# Patient Record
Sex: Female | Born: 1955
Health system: Southern US, Community
[De-identification: ages and names within clinical notes are randomized; demographics above are authoritative.]

## PROBLEM LIST (undated history)

## (undated) DIAGNOSIS — R413 Other amnesia: Secondary | ICD-10-CM

## (undated) DIAGNOSIS — G4700 Insomnia, unspecified: Secondary | ICD-10-CM

## (undated) DIAGNOSIS — G459 Transient cerebral ischemic attack, unspecified: Secondary | ICD-10-CM

## (undated) DIAGNOSIS — F319 Bipolar disorder, unspecified: Secondary | ICD-10-CM

## (undated) DIAGNOSIS — F419 Anxiety disorder, unspecified: Secondary | ICD-10-CM

## (undated) DIAGNOSIS — F909 Attention-deficit hyperactivity disorder, unspecified type: Secondary | ICD-10-CM

## (undated) DIAGNOSIS — D649 Anemia, unspecified: Secondary | ICD-10-CM

## (undated) DIAGNOSIS — E559 Vitamin D deficiency, unspecified: Secondary | ICD-10-CM

## (undated) DIAGNOSIS — F32A Depression, unspecified: Secondary | ICD-10-CM

## (undated) DIAGNOSIS — T7840XA Allergy, unspecified, initial encounter: Secondary | ICD-10-CM

## (undated) HISTORY — DX: Anxiety disorder, unspecified: F41.9

## (undated) HISTORY — DX: Anemia, unspecified: D64.9

## (undated) HISTORY — PX: TUBAL LIGATION: SHX77

## (undated) HISTORY — DX: Depression, unspecified: F32.A

## (undated) HISTORY — PX: UMBILICAL HERNIA REPAIR: SHX196

## (undated) HISTORY — DX: Transient cerebral ischemic attack, unspecified: G45.9

## (undated) HISTORY — DX: Insomnia, unspecified: G47.00

## (undated) HISTORY — DX: Allergy, unspecified, initial encounter: T78.40XA

## (undated) HISTORY — DX: Attention-deficit hyperactivity disorder, unspecified type: F90.9

## (undated) HISTORY — DX: Bipolar disorder, unspecified: F31.9

## (undated) HISTORY — DX: Vitamin D deficiency, unspecified: E55.9

## (undated) HISTORY — DX: Other amnesia: R41.3

---

## 1999-01-12 ENCOUNTER — Other Ambulatory Visit: Admission: RE | Admit: 1999-01-12 | Discharge: 1999-01-12 | Payer: Self-pay | Admitting: Obstetrics and Gynecology

## 1999-08-30 HISTORY — PX: CARPAL TUNNEL RELEASE: SHX101

## 2000-01-12 ENCOUNTER — Ambulatory Visit (HOSPITAL_BASED_OUTPATIENT_CLINIC_OR_DEPARTMENT_OTHER): Admission: RE | Admit: 2000-01-12 | Discharge: 2000-01-12 | Payer: Self-pay | Admitting: Orthopedic Surgery

## 2000-02-22 ENCOUNTER — Ambulatory Visit (HOSPITAL_BASED_OUTPATIENT_CLINIC_OR_DEPARTMENT_OTHER): Admission: RE | Admit: 2000-02-22 | Discharge: 2000-02-22 | Payer: Self-pay | Admitting: Orthopedic Surgery

## 2000-04-27 ENCOUNTER — Other Ambulatory Visit: Admission: RE | Admit: 2000-04-27 | Discharge: 2000-04-27 | Payer: Self-pay | Admitting: Obstetrics and Gynecology

## 2001-06-11 ENCOUNTER — Ambulatory Visit (HOSPITAL_COMMUNITY): Admission: RE | Admit: 2001-06-11 | Discharge: 2001-06-11 | Payer: Self-pay | Admitting: Obstetrics and Gynecology

## 2001-06-11 ENCOUNTER — Encounter: Payer: Self-pay | Admitting: Obstetrics and Gynecology

## 2001-07-23 ENCOUNTER — Other Ambulatory Visit: Admission: RE | Admit: 2001-07-23 | Discharge: 2001-07-23 | Payer: Self-pay | Admitting: Obstetrics and Gynecology

## 2001-07-25 ENCOUNTER — Encounter: Payer: Self-pay | Admitting: Obstetrics and Gynecology

## 2001-07-25 ENCOUNTER — Ambulatory Visit (HOSPITAL_COMMUNITY): Admission: RE | Admit: 2001-07-25 | Discharge: 2001-07-25 | Payer: Self-pay | Admitting: Obstetrics and Gynecology

## 2001-08-06 ENCOUNTER — Encounter: Payer: Self-pay | Admitting: Obstetrics and Gynecology

## 2001-08-06 ENCOUNTER — Ambulatory Visit (HOSPITAL_COMMUNITY): Admission: RE | Admit: 2001-08-06 | Discharge: 2001-08-06 | Payer: Self-pay | Admitting: Obstetrics and Gynecology

## 2001-08-29 DIAGNOSIS — F329 Major depressive disorder, single episode, unspecified: Secondary | ICD-10-CM | POA: Insufficient documentation

## 2001-08-29 DIAGNOSIS — F319 Bipolar disorder, unspecified: Secondary | ICD-10-CM | POA: Insufficient documentation

## 2001-08-31 ENCOUNTER — Ambulatory Visit (HOSPITAL_COMMUNITY): Admission: RE | Admit: 2001-08-31 | Discharge: 2001-08-31 | Payer: Self-pay | Admitting: Gastroenterology

## 2001-09-27 ENCOUNTER — Ambulatory Visit (HOSPITAL_COMMUNITY): Admission: RE | Admit: 2001-09-27 | Discharge: 2001-09-27 | Payer: Self-pay | Admitting: Internal Medicine

## 2001-09-27 ENCOUNTER — Encounter: Payer: Self-pay | Admitting: Internal Medicine

## 2001-10-23 ENCOUNTER — Ambulatory Visit (HOSPITAL_COMMUNITY): Admission: RE | Admit: 2001-10-23 | Discharge: 2001-10-23 | Payer: Self-pay | Admitting: Obstetrics and Gynecology

## 2001-10-23 ENCOUNTER — Encounter: Payer: Self-pay | Admitting: Obstetrics and Gynecology

## 2002-01-03 ENCOUNTER — Encounter: Payer: Self-pay | Admitting: Internal Medicine

## 2002-01-03 ENCOUNTER — Ambulatory Visit (HOSPITAL_COMMUNITY): Admission: RE | Admit: 2002-01-03 | Discharge: 2002-01-03 | Payer: Self-pay | Admitting: Internal Medicine

## 2002-01-24 ENCOUNTER — Encounter: Payer: Self-pay | Admitting: Otolaryngology

## 2002-01-24 ENCOUNTER — Ambulatory Visit (HOSPITAL_COMMUNITY): Admission: RE | Admit: 2002-01-24 | Discharge: 2002-01-24 | Payer: Self-pay | Admitting: Otolaryngology

## 2002-04-08 ENCOUNTER — Encounter (INDEPENDENT_AMBULATORY_CARE_PROVIDER_SITE_OTHER): Payer: Self-pay | Admitting: *Deleted

## 2002-04-08 ENCOUNTER — Ambulatory Visit (HOSPITAL_BASED_OUTPATIENT_CLINIC_OR_DEPARTMENT_OTHER): Admission: RE | Admit: 2002-04-08 | Discharge: 2002-04-08 | Payer: Self-pay | Admitting: Otolaryngology

## 2002-06-14 ENCOUNTER — Ambulatory Visit (HOSPITAL_COMMUNITY): Admission: RE | Admit: 2002-06-14 | Discharge: 2002-06-14 | Payer: Self-pay | Admitting: Obstetrics and Gynecology

## 2002-06-14 ENCOUNTER — Encounter: Payer: Self-pay | Admitting: Obstetrics and Gynecology

## 2002-09-27 ENCOUNTER — Ambulatory Visit (HOSPITAL_COMMUNITY): Admission: RE | Admit: 2002-09-27 | Discharge: 2002-09-27 | Payer: Self-pay | Admitting: Obstetrics and Gynecology

## 2003-04-17 ENCOUNTER — Ambulatory Visit (HOSPITAL_COMMUNITY): Admission: RE | Admit: 2003-04-17 | Discharge: 2003-04-17 | Payer: Self-pay | Admitting: Internal Medicine

## 2003-04-24 ENCOUNTER — Emergency Department (HOSPITAL_COMMUNITY): Admission: EM | Admit: 2003-04-24 | Discharge: 2003-04-24 | Payer: Self-pay | Admitting: *Deleted

## 2003-05-26 ENCOUNTER — Ambulatory Visit (HOSPITAL_COMMUNITY): Admission: RE | Admit: 2003-05-26 | Discharge: 2003-05-26 | Payer: Self-pay | Admitting: Neurology

## 2003-05-26 ENCOUNTER — Encounter: Payer: Self-pay | Admitting: Neurology

## 2003-07-17 ENCOUNTER — Ambulatory Visit (HOSPITAL_COMMUNITY): Admission: RE | Admit: 2003-07-17 | Discharge: 2003-07-17 | Payer: Self-pay | Admitting: Neurology

## 2003-07-21 ENCOUNTER — Ambulatory Visit (HOSPITAL_COMMUNITY): Admission: RE | Admit: 2003-07-21 | Discharge: 2003-07-21 | Payer: Self-pay | Admitting: Obstetrics and Gynecology

## 2003-08-13 ENCOUNTER — Ambulatory Visit (HOSPITAL_COMMUNITY): Admission: RE | Admit: 2003-08-13 | Discharge: 2003-08-13 | Payer: Self-pay | Admitting: Neurology

## 2004-09-03 ENCOUNTER — Ambulatory Visit (HOSPITAL_COMMUNITY): Admission: RE | Admit: 2004-09-03 | Discharge: 2004-09-03 | Payer: Self-pay | Admitting: Obstetrics and Gynecology

## 2004-12-16 ENCOUNTER — Inpatient Hospital Stay (HOSPITAL_COMMUNITY): Admission: RE | Admit: 2004-12-16 | Discharge: 2004-12-18 | Payer: Self-pay | Admitting: Psychiatry

## 2004-12-16 ENCOUNTER — Emergency Department (HOSPITAL_COMMUNITY): Admission: EM | Admit: 2004-12-16 | Discharge: 2004-12-16 | Payer: Self-pay | Admitting: Emergency Medicine

## 2004-12-16 ENCOUNTER — Ambulatory Visit: Payer: Self-pay | Admitting: Psychiatry

## 2005-02-01 ENCOUNTER — Encounter: Admission: RE | Admit: 2005-02-01 | Discharge: 2005-02-01 | Payer: Self-pay | Admitting: Neurology

## 2005-03-22 ENCOUNTER — Encounter: Admission: RE | Admit: 2005-03-22 | Discharge: 2005-06-20 | Payer: Self-pay | Admitting: Psychology

## 2005-03-22 ENCOUNTER — Ambulatory Visit: Payer: Self-pay | Admitting: Psychology

## 2005-03-29 ENCOUNTER — Ambulatory Visit: Payer: Self-pay | Admitting: Psychology

## 2005-08-29 DIAGNOSIS — G2401 Drug induced subacute dyskinesia: Secondary | ICD-10-CM | POA: Insufficient documentation

## 2005-10-03 ENCOUNTER — Ambulatory Visit (HOSPITAL_COMMUNITY): Admission: RE | Admit: 2005-10-03 | Discharge: 2005-10-03 | Payer: Self-pay | Admitting: Obstetrics and Gynecology

## 2006-10-12 ENCOUNTER — Ambulatory Visit (HOSPITAL_COMMUNITY): Admission: RE | Admit: 2006-10-12 | Discharge: 2006-10-12 | Payer: Self-pay | Admitting: Obstetrics and Gynecology

## 2007-07-25 ENCOUNTER — Ambulatory Visit (HOSPITAL_COMMUNITY): Admission: RE | Admit: 2007-07-25 | Discharge: 2007-07-25 | Payer: Self-pay | Admitting: Internal Medicine

## 2007-11-22 ENCOUNTER — Ambulatory Visit (HOSPITAL_COMMUNITY): Admission: RE | Admit: 2007-11-22 | Discharge: 2007-11-22 | Payer: Self-pay | Admitting: Obstetrics and Gynecology

## 2009-05-22 ENCOUNTER — Encounter: Admission: RE | Admit: 2009-05-22 | Discharge: 2009-05-22 | Payer: Self-pay | Admitting: Obstetrics and Gynecology

## 2010-09-18 ENCOUNTER — Encounter: Payer: Self-pay | Admitting: Neurology

## 2010-09-19 ENCOUNTER — Encounter: Payer: Self-pay | Admitting: Obstetrics and Gynecology

## 2010-09-29 ENCOUNTER — Emergency Department (HOSPITAL_COMMUNITY)
Admission: EM | Admit: 2010-09-29 | Discharge: 2010-09-30 | Disposition: A | Discharge status: Home / Self Care | Payer: BC Managed Care – PPO | Attending: Emergency Medicine | Admitting: Emergency Medicine

## 2010-09-29 ENCOUNTER — Emergency Department (HOSPITAL_COMMUNITY): Payer: BC Managed Care – PPO

## 2010-09-29 DIAGNOSIS — R209 Unspecified disturbances of skin sensation: Secondary | ICD-10-CM | POA: Insufficient documentation

## 2010-09-29 DIAGNOSIS — F411 Generalized anxiety disorder: Secondary | ICD-10-CM | POA: Insufficient documentation

## 2010-09-29 DIAGNOSIS — Z79899 Other long term (current) drug therapy: Secondary | ICD-10-CM | POA: Insufficient documentation

## 2010-09-29 DIAGNOSIS — R079 Chest pain, unspecified: Secondary | ICD-10-CM | POA: Insufficient documentation

## 2010-09-29 DIAGNOSIS — F319 Bipolar disorder, unspecified: Secondary | ICD-10-CM | POA: Insufficient documentation

## 2010-09-29 LAB — DIFFERENTIAL
Basophils Absolute: 0 10*3/uL (ref 0.0–0.1)
Basophils Relative: 0 % (ref 0–1)
Eosinophils Absolute: 0.3 10*3/uL (ref 0.0–0.7)
Eosinophils Relative: 4 % (ref 0–5)
Lymphocytes Relative: 36 % (ref 12–46)
Lymphs Abs: 2.6 10*3/uL (ref 0.7–4.0)
Monocytes Absolute: 0.7 10*3/uL (ref 0.1–1.0)
Monocytes Relative: 10 % (ref 3–12)
Neutro Abs: 3.7 10*3/uL (ref 1.7–7.7)
Neutrophils Relative %: 51 % (ref 43–77)

## 2010-09-29 LAB — BASIC METABOLIC PANEL
BUN: 12 mg/dL (ref 6–23)
CO2: 26 mEq/L (ref 19–32)
Calcium: 9.3 mg/dL (ref 8.4–10.5)
Chloride: 105 mEq/L (ref 96–112)
Creatinine, Ser: 0.87 mg/dL (ref 0.4–1.2)
GFR calc Af Amer: >60 mL/min (ref >60)
GFR calc non Af Amer: >60 mL/min (ref >60)
Glucose, Bld: 120 mg/dL — ABNORMAL HIGH (ref 70–99)
Potassium: 3.6 mEq/L (ref 3.5–5.1)
Sodium: 138 mEq/L (ref 135–145)

## 2010-09-29 LAB — CBC
HCT: 34.2 % — ABNORMAL LOW (ref 36.0–46.0)
Hemoglobin: 11.7 g/dL — ABNORMAL LOW (ref 12.0–15.0)
MCH: 30.3 pg (ref 26.0–34.0)
MCHC: 34.2 g/dL (ref 30.0–36.0)
MCV: 88.6 fL (ref 78.0–100.0)
Platelets: 282 10*3/uL (ref 150–400)
RBC: 3.86 MIL/uL — ABNORMAL LOW (ref 3.87–5.11)
RDW: 12.4 % (ref 11.5–15.5)
WBC: 7.4 10*3/uL (ref 4.0–10.5)

## 2010-09-29 LAB — POCT CARDIAC MARKERS
CKMB, poc: <1 ng/mL — ABNORMAL LOW (ref 1.0–8.0)
Myoglobin, poc: 61.3 ng/mL (ref 12–200)
Troponin i, poc: <0.05 ng/mL (ref 0.00–0.09)

## 2010-09-30 LAB — POCT CARDIAC MARKERS
CKMB, poc: <1 ng/mL — ABNORMAL LOW (ref 1.0–8.0)
Myoglobin, poc: 214 ng/mL (ref 12–200)
Troponin i, poc: <0.05 ng/mL (ref 0.00–0.09)

## 2011-01-14 NOTE — Op Note (Signed)
[place]. [facility]  Patient:    Debra Buchanan, Debra Buchanan                     MRN: [id number] Proc. Date: 02/22/00 Adm. Date:  [id number] Attending:  Ronne Binning CC:         Nicki Reaper, M.D. (2)                           Operative Report  PREOPERATIVE DIAGNOSIS:  Carpal tunnel syndrome, right hand.  POSTOPERATIVE DIAGNOSIS:  Carpal tunnel syndrome, right hand.  OPERATION:  Decompression of right median nerve.  SURGEON:  Nicki Reaper, M.D.  ASSISTANT:  Joaquin Courts, R.N.  ANESTHESIA:  Forearm-based IV region.  ANESTHESIOLOGIST:  Cliffton Asters. Ivin Booty, M.D.  HISTORY:  The patient is a 55 year old female with a history of carpal tunnel syndrome, EMG and nerve conductions positive, which has not responded to conservative treatment.  DESCRIPTION OF PROCEDURE:  The patient was brought to the operating room where a forearm-based IV regional anesthetic was carried out without difficulty. She was prepped and draped using Betadine scrubbing solution with the right arm free.  A longitudinal incision was made in the palm and carried down through subcutaneous tissue.  Bleeders were electrocauterized.  Palmar fascia was split, superficial palmar arch identified, the flexor tendon to the ring and little finger identified.  To the ulnar side of the median nerve, the carpal retinaculum was incised with sharp dissection.  A right-angle and Sewall retractor were placed between skin and forearm fascia.  The fascia was released for approximately 3 cm proximal to the wrist crease under direct vision.  No further lesions were identified.  ______ tissue was moderately thickened.  The wound was irrigated.  The skin was closed with interrupted 5-0 nylon suture.  A sterile compressive dressing and splint were applied. The patient tolerated the procedure well and was taken to the recovery room for observation in satisfactory condition.  She is discharged home to return to [facility]  [facility] in one week, on Vicodin and Keflex. DD:  02/22/00 TD:  02/23/00 Job: [id number]

## 2011-01-14 NOTE — Op Note (Signed)
[place]. [facility]  Patient:    Debra Buchanan, Debra Buchanan                       MRN: [id number] Proc. Date: 01/12/00 Attending:  Nicki Reaper, M.D. Dictator:   Nicki Reaper, M.D. CC:         Nicki Reaper, M.D. 2 copies                           Operative Report  PREOPERATIVE DIAGNOSIS:  Carpal tunnel syndrome, left hand.  POSTOPERATIVE DIAGNOSIS:  Carpal tunnel syndrome, left hand.  OPERATION:  Decompression of left median nerve.  SURGEON:  Dr. Merlyn Lot.  ASSISTANT:  ______ RN.  ANESTHESIA:  Forearm based IV regional.  ANESTHESIOLOGIST:  Judie Petit, M.D.  HISTORY OF PRESENT ILLNESS:  The patient is a 55 year old female with a history of carpal tunnel syndrome, EMG nerve conduction is positive, which has not responded to conservative treatment.  DESCRIPTION OF PROCEDURE:  The patient was brought to the operating room where a forearm based IV regional anesthetic was carried out without difficulty. She was prepped and draped using Betadine scrubbing solution with the left arm free.  A longitudinal incision was made in the palm, carried down through subcutaneous tissue, bleeders were electrocauterized.  Palmar fascia was split.  The superficial palmar arch identified.  The flexor tendon to the ring and little finger identified.  To the ulnar side of the median nerve the carpal retinaculum was incised with sharp dissection.  A right angle Shohl retractor were placed between the skin and forearm fascia.  The fascia was released for approximately 3 cm proximal to the wrist crease under direct vision.  Canal was explored.  The synovial tissue was thickened.  No further lesions were identified.  The wound was irrigated.  The skin was closed with interrupted 5-0 Nylon sutures.  A sterile compressive dressing and splint were applied.  The patient tolerated the procedure well, and was taken to the recovery room for observation in satisfactory condition.  She  is discharged home to return to the [facility] [place] in one week on Vicodin and Keflex. DD:  01/12/00 TD:  01/12/00 Job: [id number]

## 2011-01-14 NOTE — Op Note (Signed)
NAME:  Debra Buchanan, Debra Buchanan                          ACCOUNT NO.:  [id number]   MEDICAL RECORD NO.:  [id number]                   PATIENT TYPE:   LOCATION:                                       FACILITY:   PHYSICIAN:  Jefry H. Pollyann Kennedy, M.D.                DATE OF BIRTH:   DATE OF PROCEDURE:  04/08/2002  DATE OF DISCHARGE:                                 OPERATIVE REPORT   PREOPERATIVE DIAGNOSES:  Right anterior choanal polyp.   POSTOPERATIVE DIAGNOSES:  Right anterior choanal polyp.   OPERATION PERFORMED:  Nasal endoscopy with removal of right anterior choanal  polyp.   SURGEON:  Jefry H. Pollyann Kennedy, M.D.   ANESTHESIA:  General endotracheal.   COMPLICATIONS:  None.   ESTIMATED BLOOD LOSS:  Minimal.   OPERATIVE FINDINGS:  A 3 to 4 cm polyp arising from the posterior aspect the  right middle turbinate.  No other abnormalities identified.  Left side  clear.   REFERRING PHYSICIAN:  Kingsley Callander. Ouida Sills, M.D.   DISPOSITION:  The patient tolerated the procedure well, was awakened,  extubated and transferred to recovery in stable condition.   INDICATIONS FOR PROCEDURE:  This is a 55 year old lady with a history of  nasal obstruction on the right side and CT evidence of anterior choanal  polyp.  The risks, benefits, alternatives and complications of the procedure  were explained to the patient, who  seemed to understand and agreed to  surgery.   DESCRIPTION OF PROCEDURE:  The patient was taken to the operating room and  placed on the operating table in a supine position.  Following induction of  general endotracheal anesthesia, the face was prepped and draped in the  standard fashion.  Oxymetazoline spray was used preoperatively in the nose.  The right nasal cavity was inspected using a 0 degree nasal endoscope and 1%  Xylocaine with epinephrine was infiltrated into the posterior aspect of the  right middle turbinate in the base of the polyp.  A combination of a  straight through-cut forceps  and straight Wilds-Blakesley forceps was used  to remove the polyp in its entirety and the attaching mucosa on the  posterior aspect of the turbinate.  The microdebrider was used to smooth up  and clean up the posterior middle turbinate.  There were no  other abnormalities identified.  The nasal cavity was suctioned and Afrin  soaked pledgets were placed  in the posterior nasal cavity.  They were  removed in the recovery room.  The patient was awakened, extubated and  transferred to recovery in stable condition.                                                 Jefry H. Pollyann Kennedy, M.D.    JHR/MEDQ  D:  04/08/2002  T:  04/10/2002  Job:  [id number]   cc:   Kingsley Callander. Ouida Sills, M.D.

## 2011-01-14 NOTE — Procedures (Signed)
   NAME:  Debra Buchanan, Debra Buchanan                        ACCOUNT NO.:  [id number]   MEDICAL RECORD NO.:  [id number]                   PATIENT TYPE:  OUT   LOCATION:  RESP                                 FACILITY:  [facility]   PHYSICIAN:  Kofi A. Gerilyn Pilgrim, M.D.              DATE OF BIRTH:  1956/04/23   DATE OF PROCEDURE:  DATE OF DISCHARGE:                                EEG INTERPRETATION   HISTORY:  This is a 55 year old lady who is suspected of having seizures.   ANALYSIS:  The 16-channel recording is conducted for approximately 20  minutes.  There is a beta posterior rhythm of 15-16 Hz seen throughout the  recording.  Awake and drowsy activities are seen.  Photic stimulation does  no elicit any abnormal responses.  There is no focal slowing, lateralized  slowing or epileptiform activity seen.   IMPRESSION:  This is a normal recording of the awake and drowsy states.   If clinically indicated a sleep deprived recording may be useful.                                                 Kofi A. Gerilyn Pilgrim, M.D.    KAD/MEDQ  D:  04/17/2003  T:  04/17/2003  Job:  [id number]

## 2011-01-14 NOTE — Op Note (Signed)
NAME:  Debra Buchanan, Debra Buchanan                        ACCOUNT NO.:  [id number]   MEDICAL RECORD NO.:  [id number]                   PATIENT TYPE:  AMB   LOCATION:  DAY                                  FACILITY:  [facility]   PHYSICIAN:  Malachi Pro. Ambrose Mantle, M.D.              DATE OF BIRTH:  1956/08/05   DATE OF PROCEDURE:  09/27/2002  DATE OF DISCHARGE:                                 OPERATIVE REPORT   PREOPERATIVE DIAGNOSES:  1. Voluntary sterilization.  2. Chronic left lower abdominal pain.   POSTOPERATIVE DIAGNOSIS:  1. Voluntary sterilization.  2. Chronic left lower abdominal pain.  3. Endometriosis along the anterior bladder flap on the left and on the     peritoneum of the left pelvic sidewall under  the tube and ovary.   OPERATION:  1. Open laparoscopy.  2. Bilateral laparoscopic tubal cauterization.  3. Fulguration of endometriosis on the left anterior bladder flap.   SURGEON:  Malachi Pro. Ambrose Mantle, M.D.   ANESTHESIA:  General.   DESCRIPTION OF PROCEDURE:  The patient was brought to the operating room and  placed under satisfactory general anesthesia. She was placed in the [place]  stirrups. The abdomen, vulva, vagina and urethra were prepped with Betadine  solution. The bladder was emptied with a [place] catheter. Because of a tight  cervix, I could not place the Hulka cannula initially, but then by using a  sound I was able to create a channel and then place the Hulka cannula into  the endometrial cavity and attach it to the anterior cervical lip.   The patient had had a previous umbilical herniorrhaphy, so after draping as  a sterile field, I made a semilunar incision under the umbilicus trying to  stay about 1 cm below the inferior margin of the umbilicus and stay away  from the umbilical repair  sutures. I got down to the fascia and elevated  the fascia with Kocher clamps and incised it and then entered the peritoneal  cavity. There were not a great number of adhesions around the  umbilical  hernia site.   I then inspected the abdomen. The liver was smooth. I did not see the  gallbladder. I inspected the pelvis and found the posterior cul-de-sac to be  normal. The uterus was normal sized without abnormalities. On the left  anterior bladder flap, there were several spots of endometriosis.  Most of  them were clear in color. There was a little bit of scarring present. There  was some dark pigment also.   Inspection of the right tube and ovary revealed them to be normal. The left  tube and ovary were normal. However, under the left tube and ovary on the  left pelvic peritoneal sidewall were 2 dots of endometriosis. I did make  photographs of the endometriosis. I did not cauterize these spots because  they were very close to where the  ureter would be.  I then did a laparoscopic tubal cauterization on both tubes, starting close  to the midpoint of each tube and going toward the cornu. I cauterized the  areas until all the vapor was gone. I inspected for hemostasis. There was no  bleeding. I removed the lower trocar and there was no  bleeding from that  site.   I then removed the laparoscope, took the pursestring suture of 0 Vicryl that  had been used to pull the fascia around the Hulka cannula, and it seemed to  close the fascial defect quite nicely. I then closed the subcutaneous tissue  with 2 interrupted sutures of 3-0 Vicryl and the skin with 3-0 plain cat  gut. There was a protrusion in the inferior aspect of the incision from  bunching the subcutaneous tissue together; this certainly did not represent  a hernia.   At this point the Hulka cannula was removed. The lower trocar site which had  been inserted under direct visualization was reapproximated with 1/4 inch  Steri-Strips.   The patient seemed to tolerate the procedure well. She was returned to the  recovery room in satisfactory condition.                                               Malachi Pro.  Ambrose Mantle, M.D.    TFH/MEDQ  D:  09/27/2002  T:  09/27/2002  Job:  [id number]

## 2011-01-14 NOTE — Op Note (Signed)
NAME:  Debra Buchanan, Debra Buchanan                        ACCOUNT NO.:  [id number]   MEDICAL RECORD NO.:  [id number]                   PATIENT TYPE:  OUT   LOCATION:  MDC                                  FACILITY:  [facility]   PHYSICIAN:  Santina Evans A. Orlin Hilding, M.D.          DATE OF BIRTH:  08-02-1956   DATE OF PROCEDURE:  08/13/2003  DATE OF DISCHARGE:                                 OPERATIVE REPORT   PROCEDURE PERFORMED:  Lumbar puncture.    [name] is a 55 year old white woman with a history of possible seizure  disorder, lapse of awareness, who also has an abnormal MRI which was  changing and has an appearance typical of multiple sclerosis with no risk  factors for stroke.   INDICATIONS FOR PROCEDURE:  Diagnostic.  The procedure was explained to the  patient along with risks of possible headache and potential benefits of  possible diagnosis.  Consent was obtained.   DESCRIPTION OF PROCEDURE:  The patient was positioned in left lateral  decubitus position, sterilely draped.  She was prepped with alcohol wipes  times three in lieu of Betadine due to an iodine allergy.  She was locally  anesthetized with 1% Xylocaine.  The L3-4 level was entered without  difficulty with rather slow flow.  Opening pressure was deferred because of  the slow flow.  I was able to collect about 4 mL of clear colorless fluid  which is to be sent to the lab for cell count, glucose, protein, VDRL,  oligoclonal bands, IgG albumin index, Gram stain and culture.  No immediate  complications were noted.  The patient tolerated the procedure well.  She is  to lie in Trendelenburg for about 30 minutes and then be discharged home.                                               Catherine A. Orlin Hilding, M.D.    CAW/MEDQ  D:  08/13/2003  T:  08/13/2003  Job:  [id number]

## 2011-01-14 NOTE — Discharge Summary (Signed)
NAME:  Debra Buchanan, Debra Buchanan              ACCOUNT NO.:  [id number]   MEDICAL RECORD NO.:  [id number]         PATIENT TYPE:  BIPS   LOCATION:                                FACILITY:  [facility]   PHYSICIAN:  Syed T. Arfeen, M.D.   DATE OF BIRTH:  [birth year]   DATE OF ADMISSION:  12/16/2004  DATE OF DISCHARGE:  12/18/2004                                 DISCHARGE SUMMARY   IDENTIFYING DATA:  The patient is a 55 year old white female.  She is  divorced and came as a voluntary.  The patient was admitted due to slurred  speech and confusion.  The patient is a Runner, broadcasting/film/video at [employer] and she  was referred by the supervisor for the above reason.  The patient told while  she was confused that she is suicidal but does not remember saying it on the  admission day, then she denies that she has ever been suicidal.  She denies  any auditory hallucinations, homicidal ideation, but she does report that  she had some memory loss on and off and she has been on Aricept from her  neurologist.  She said that she is not sure why she said suicidal thoughts  to her supervisor.   PAST PSYCHIATRIC HISTORY:  The patient sees Dr. Andee Poles and this is  her first admission to [facility].   ALCOHOL AND DRUG HISTORY:  Denies.   PAST MEDICAL HISTORY:  The patient sees Dr. Carylon Perches.  Medical problems are  memory impairment.  She had a history of anorexia.   ALLERGIES:  The patient denies.   PHYSICAL EXAMINATION:  Please see the admission note.   MENTAL STATUS EXAM:  Fairly alert.  Speech is normal tone and articulate.  Mood euthymic, affect constricted.  Thought processes logical and goal  oriented, denied any hallucinations, suicide ideation or homicidal ideation.  Cognitive function intact, judgment and insight adequate.   ADMISSION DIAGNOSES:   AXIS I:  1.  Amnesic episode not otherwise specified.  2.  Delirium disorder rule out bipolar disorder with acute exacerbation.   AXIS II:   Deferred.   AXIS III:  __________  by history, memory impairment.   AXIS IV:  Job stress.   AXIS V:  45.   HOSPITAL COURSE:  The patient was admitted and placed on a safety check with  close monitoring of her vitals and mental status.  She was restarted on her  medications which are Geodon 50 mg 2 tablets at bedtime, Xanax ER 1 mg  b.i.d., Cogentin 2 mg b.i.d., Aricept 10 mg q.a.m., Lunesta 3 mg 2 at  bedtime, and Lamictal 200 mg q.a.m.  The patient was also restarted on her  vitamins.  The patient started to show some improvement.  She stated that  due to her memory impairment she does not remember taking her medications  regularly and apparently she took some medication a second time and then she  has no idea what happened.  She says she has a longstanding memory problem  and she has seen a neurologist for her memory impairment.  The next  morning,  she stated that she is feeling much better.  She is more alert and more  oriented and there was no further episode of any slurred speech or  confusion.  She said that she ready to be discharged and she reported that  she has been seeing Dr. Andee Poles for her bipolar disorder and she has  been very stable on her medication.  She agreed that she should be more  careful about taking her medication and she said she would like to continue  to see her neurologist who is working to find out the reason of her memory  impairment.  She had an appointment on May 15 with that neurologist.  The  patient felt that she is doing much better, more alert and more oriented,  and she said that she will stay with her friends for the next few days to a  week until she gets more clear and totally free from confusion.  She  reported no paranoia, no obsession, no hallucination, no suicidal thoughts.  She reported that she has been sleeping very well and she feels very safe to  be discharge.   CONDITION AT DISCHARGE:  Markedly improved, no episodes of  confusion or  sedation, no episode of slurring speech, reported alert and oriented x3,  denies any auditory hallucinations, suicidal or homicidal ideation, denies  any depressive thoughts, feels upbeat and excited.  She reported no acute  side effects with the medication and she said she has been taking these  medications for a long time and has been tolerating very well.   DISCHARGE MEDICATIONS:  1.  Geodon 50 mg 2 tablets at bedtime.  2.  Xanax 1 mg 1 tablet a day.  3.  Cogentin 2 mg 1 tablet twice a day.  4.  Aricept 10 mg 1 daily.  5.  Lamictal 200 mg 1 daily.  6.  Lunesta 3 mg, 2 tablets at bedtime.  The patient was explained fully about the consequences of the medications  and that some of the medications may potentially impair her memory.  The  patient understood the risks and benefits of the medication and said that  she will talk to her psychiatrist and the doctor on future followup, if some  of the medication can be reduced to avoid memory impairment.  The patient  was also recommended to use medication planners to organize her medications.  The patient acknowledged.  The patient reported that she had these  medications at home and there was no need to give any prescription.   DISPOSITION:  The patient will follow up to see her neurologist, Dr. Tonita Phoenix  on May 5 at 1:30 p.m. at [facility], and the patient  will follow up with Dr. Nolen Mu, and she says she will call her  psychiatrist to make a followup appointment in the next 2-3 days.   DISCHARGE DIAGNOSES:   AXIS I:  1.  Delirium disorder, not otherwise specified.  2.  Bipolar disorder by history.   AXIS II:  Deferred.   AXIS III:  Cognitive impairment.   AXIS IV:  Medical issues.   AXIS V:  70.      STA/MEDQ  D:  12/22/2004  T:  12/22/2004  Job:  [id number]

## 2011-03-21 ENCOUNTER — Other Ambulatory Visit: Payer: Self-pay | Admitting: Obstetrics and Gynecology

## 2011-03-21 DIAGNOSIS — Z1231 Encounter for screening mammogram for malignant neoplasm of breast: Secondary | ICD-10-CM

## 2011-04-05 ENCOUNTER — Ambulatory Visit: Payer: BC Managed Care – PPO

## 2011-04-18 ENCOUNTER — Ambulatory Visit: Payer: BC Managed Care – PPO

## 2011-04-22 ENCOUNTER — Encounter: Payer: Self-pay | Admitting: Gastroenterology

## 2011-04-25 ENCOUNTER — Ambulatory Visit
Admission: RE | Admit: 2011-04-25 | Discharge: 2011-04-25 | Disposition: A | Discharge status: Home / Self Care | Payer: BC Managed Care – PPO | Source: Ambulatory Visit | Attending: Obstetrics and Gynecology | Admitting: Obstetrics and Gynecology

## 2011-04-25 DIAGNOSIS — Z1231 Encounter for screening mammogram for malignant neoplasm of breast: Secondary | ICD-10-CM

## 2011-05-23 ENCOUNTER — Ambulatory Visit: Payer: BC Managed Care – PPO | Admitting: Gastroenterology

## 2011-05-27 ENCOUNTER — Encounter: Payer: Self-pay | Admitting: Gastroenterology

## 2011-06-21 ENCOUNTER — Encounter: Payer: Self-pay | Admitting: Gastroenterology

## 2011-06-21 ENCOUNTER — Ambulatory Visit (INDEPENDENT_AMBULATORY_CARE_PROVIDER_SITE_OTHER): Payer: BC Managed Care – PPO | Admitting: Gastroenterology

## 2011-06-21 VITALS — BP 100/70 | HR 60 | Ht 66.0 in | Wt 146.0 lb

## 2011-06-21 DIAGNOSIS — K219 Gastro-esophageal reflux disease without esophagitis: Secondary | ICD-10-CM

## 2011-06-21 DIAGNOSIS — Z1211 Encounter for screening for malignant neoplasm of colon: Secondary | ICD-10-CM

## 2011-06-21 MED ORDER — DEXLANSOPRAZOLE 60 MG PO CPDR: 60.0000 mg | DELAYED_RELEASE_CAPSULE | Freq: Every day | ORAL | Status: DC

## 2011-06-21 NOTE — Progress Notes (Signed)
[name] is a pleasant 55 year old white female referred at the request of Dr. Ouida Sills for evaluation of hoarseness. She has been complaining of a raspy, hoarse voice. She was seen by ENT who found some swelling in her posterior pharynx which was attributed to acid reflux. She took several weeks of omeprazole without improvement. She denies dysphagia or cough. In 2001 she underwent upper endoscopy for chronic cough. Exam was negative. She is also complaining of early satiety and a sense of fullness in her upper abdomen. She is moving her bowels regularly. Last colonoscopy was in 2003 although the results are not known.      Past Medical History  Diagnosis Date  . Anxiety   . Bipolar disorder   . Anemia    Past Surgical History  Procedure Date  . Cesarean section   . Carpal tunnel release   . Tubal ligation     reports that she has never smoked. She has never used smokeless tobacco. She reports that she drinks alcohol. She reports that she does not use illicit drugs. family history includes Breast cancer in her mother and Emphysema in her father.  Current medications and social history were reviewed in [employer] electronic medical record  Review of Systems: She complains of anxiety, night sweats and sleeping problems Pertinent positive and negative review of systems were noted in the above HPI section. All other review of systems were otherwise negative.  Vital signs were reviewed in today's medical record Physical Exam: General: Well developed , well nourished, no acute distress Head: Normocephalic and atraumatic Eyes:  sclerae anicteric, EOMI Ears: Normal auditory acuity Mouth: No deformity or lesions Neck: Supple, no masses or thyromegaly Lungs: Clear throughout to auscultation Heart: Regular rate and rhythm; no murmurs, rubs or bruits Abdomen: Soft, non tender and non distended. No masses, hepatosplenomegaly or hernias noted. Normal Bowel  sounds Rectal:deferred Musculoskeletal: Symmetrical with no gross deformities  Skin: No lesions on visible extremities Pulses:  Normal pulses noted Extremities: No clubbing, cyanosis, edema or deformities noted Neurological: Alert oriented x 4, grossly nonfocal Cervical Nodes:  No significant cervical adenopathy Inguinal Nodes: No significant inguinal adenopathy Psychological:  Alert and cooperative. Normal mood and affect

## 2011-06-21 NOTE — Patient Instructions (Signed)
Upper GI Endoscopy Upper GI endoscopy means using a flexible scope to look at the esophagus, stomach, and upper small bowel. This is done to make a diagnosis in people with heartburn, abdominal pain, or abnormal bleeding. Sometimes an endoscope is needed to remove foreign bodies or food that become stuck in the esophagus; it can also be used to take biopsy samples. For the best results, do not eat or drink for 8 hours before having your upper endoscopy.  To perform the endoscopy, you will probably be sedated and your throat will be numbed with a special spray. The endoscope is then slowly passed down your throat (this will not interfere with your breathing). An endoscopy exam takes 15 to 30 minutes to complete and there is no real pain. Patients rarely remember much about the procedure. The results of the test may take several days if a biopsy or other test is taken.  You may have a sore throat after an endoscopy exam. Serious complications are very rare. Stick to liquids and soft foods until your pain is better. Do not drive a car or operate any dangerous equipment for at least 24 hours after being sedated. SEEK IMMEDIATE MEDICAL CARE IF:   You have severe throat pain.   You have shortness of breath.   You have bleeding problems.   You have a fever.   You have difficulty recovering from your sedation.  Document Released: 09/22/2004 Document Revised: 04/27/2011 Document Reviewed: 08/17/2008 [facility] Patient Information 2012 [place], [place]. Your Endoscopy is scheduled on 06/24/2011 at 4pm

## 2011-06-21 NOTE — Assessment & Plan Note (Signed)
Plan followup colonoscopy in 2013 

## 2011-06-21 NOTE — Assessment & Plan Note (Addendum)
Voice changes with hoarseness are very suggestive of acid reflux. Her history of chronic cough is also suspicious for the same problem. She also has complaints of abdominal fullness which could be due to ulcer or nonulcer dyspepsia.  Recommendations #1 trial of dexilant 60 milligrams daily #2 upper endoscopy

## 2011-06-24 ENCOUNTER — Telehealth: Payer: Self-pay | Admitting: *Deleted

## 2011-06-24 ENCOUNTER — Other Ambulatory Visit: Payer: BC Managed Care – PPO | Admitting: Gastroenterology

## 2011-06-24 ENCOUNTER — Encounter: Payer: Self-pay | Admitting: *Deleted

## 2011-06-24 ENCOUNTER — Encounter: Payer: Self-pay | Admitting: Gastroenterology

## 2011-06-24 ENCOUNTER — Ambulatory Visit (AMBULATORY_SURGERY_CENTER): Payer: BC Managed Care – PPO | Admitting: Gastroenterology

## 2011-06-24 VITALS — BP 124/78 | HR 62 | Temp 96.7°F | Resp 16 | Ht 66.0 in | Wt 146.0 lb

## 2011-06-24 DIAGNOSIS — K219 Gastro-esophageal reflux disease without esophagitis: Secondary | ICD-10-CM

## 2011-06-24 MED ORDER — SODIUM CHLORIDE 0.9 % IV SOLN: 500.0000 mL | INTRAVENOUS | Status: DC

## 2011-06-24 MED ORDER — OMEPRAZOLE MAGNESIUM 20 MG PO TBEC: 20.0000 mg | DELAYED_RELEASE_TABLET | Freq: Two times a day (BID) | ORAL | Status: DC

## 2011-06-24 NOTE — Telephone Encounter (Signed)
Dr Arlyce Dice, [facility] will not cover Dexilant that was prescribed unless she has tried and failed Omeprazole, I sent her in 20mg  to take bid.   Will try dexilant again after she has a trial of omeprazole and it doesn't work

## 2011-06-24 NOTE — Patient Instructions (Addendum)
Gastroesophageal Reflux Disease, Adult Gastroesophageal reflux disease (GERD) happens when acid from your stomach flows up into the esophagus. When acid comes in contact with the esophagus, the acid causes soreness (inflammation) in the esophagus. Over time, GERD may create small holes (ulcers) in the lining of the esophagus. CAUSES   Increased body weight. This puts pressure on the stomach, making acid rise from the stomach into the esophagus.   Smoking. This increases acid production in the stomach.   Drinking alcohol. This causes decreased pressure in the lower esophageal sphincter (valve or ring of muscle between the esophagus and stomach), allowing acid from the stomach into the esophagus.   Late evening meals and a full stomach. This increases pressure and acid production in the stomach.   A malformed lower esophageal sphincter.  Sometimes, no cause is found. SYMPTOMS   Burning pain in the lower part of the mid-chest behind the breastbone and in the mid-stomach area. This may occur twice a week or more often.   Trouble swallowing.   Sore throat.   Dry cough.   Asthma-like symptoms including chest tightness, shortness of breath, or wheezing.  DIAGNOSIS  Your caregiver may be able to diagnose GERD based on your symptoms. In some cases, X-rays and other tests may be done to check for complications or to check the condition of your stomach and esophagus. TREATMENT  Your caregiver may recommend over-the-counter or prescription medicines to help decrease acid production. Ask your caregiver before starting or adding any new medicines.  HOME CARE INSTRUCTIONS   Change the factors that you can control. Ask your caregiver for guidance concerning weight loss, quitting smoking, and alcohol consumption.   Avoid foods and drinks that make your symptoms worse, such as:   Caffeine or alcoholic drinks.   Chocolate.   Peppermint or mint flavorings.   Garlic and onions.   Spicy foods.     Citrus fruits, such as oranges, lemons, or limes.   Tomato-based foods such as sauce, chili, salsa, and pizza.   Fried and fatty foods.   Avoid lying down for the 3 hours prior to your bedtime or prior to taking a nap.   Eat small, frequent meals instead of large meals.   Wear loose-fitting clothing. Do not wear anything tight around your waist that causes pressure on your stomach.   Raise the head of your bed 6 to 8 inches with wood blocks to help you sleep. Extra pillows will not help.   Only take over-the-counter or prescription medicines for pain, discomfort, or fever as directed by your caregiver.   Do not take aspirin, ibuprofen, or other nonsteroidal anti-inflammatory drugs (NSAIDs).  SEEK IMMEDIATE MEDICAL CARE IF:   You have pain in your arms, neck, jaw, teeth, or back.   Your pain increases or changes in intensity or duration.   You develop nausea, vomiting, or sweating (diaphoresis).   You develop shortness of breath, or you faint.   Your vomit is green, yellow, black, or looks like coffee grounds or blood.   Your stool is red, bloody, or black.  These symptoms could be signs of other problems, such as heart disease, gastric bleeding, or esophageal bleeding. MAKE SURE YOU:   Understand these instructions.   Will watch your condition.   Will get help right away if you are not doing well or get worse.  Document Released: 05/25/2005 Document Revised: 04/27/2011 Document Reviewed: 03/04/2011 [facility] Patient Information 2012 [place], [place]. Please refer to the neon green sheet for instructions  regarding activity for the rest of today.

## 2011-06-24 NOTE — Progress Notes (Signed)
Pt did have gagging with the scope advancement.  More sedation ordered per Dr. Marzetta Board orders.  Pt relaxed and tolerated the EGD well. maw

## 2011-06-27 ENCOUNTER — Telehealth: Payer: Self-pay | Admitting: *Deleted

## 2011-06-27 ENCOUNTER — Telehealth: Payer: Self-pay

## 2011-06-27 ENCOUNTER — Other Ambulatory Visit: Payer: Self-pay | Admitting: Gastroenterology

## 2011-06-27 ENCOUNTER — Telehealth: Payer: Self-pay | Admitting: Gastroenterology

## 2011-06-27 NOTE — Telephone Encounter (Signed)
Insurance questions. 

## 2011-06-27 NOTE — Telephone Encounter (Signed)
She already took omeprazole and remains symptomatic, hence, the Rx for prevacid

## 2011-06-27 NOTE — Telephone Encounter (Signed)
No id on machine, no message left  

## 2011-06-29 MED ORDER — LANSOPRAZOLE 30 MG PO CPDR: 30.0000 mg | DELAYED_RELEASE_CAPSULE | Freq: Every day | ORAL | Status: DC

## 2011-06-29 NOTE — Telephone Encounter (Signed)
Per Dr Arlyce Dice, Send pt Prevacid, pt has already taken Prilosec twice daily and was innaffective

## 2011-07-05 NOTE — Telephone Encounter (Signed)
done

## 2011-07-26 ENCOUNTER — Encounter: Payer: Self-pay | Admitting: Gastroenterology

## 2011-07-26 ENCOUNTER — Ambulatory Visit (INDEPENDENT_AMBULATORY_CARE_PROVIDER_SITE_OTHER): Payer: BC Managed Care – PPO | Admitting: Gastroenterology

## 2011-07-26 ENCOUNTER — Ambulatory Visit: Payer: BC Managed Care – PPO | Admitting: Gastroenterology

## 2011-07-26 VITALS — BP 104/70 | HR 67 | Ht 66.0 in | Wt 150.6 lb

## 2011-07-26 DIAGNOSIS — K219 Gastro-esophageal reflux disease without esophagitis: Secondary | ICD-10-CM

## 2011-07-26 NOTE — Assessment & Plan Note (Signed)
She remains symptomatic on Prevacid.  Recommendations #1 trial of dexilant 60 mg twice a day (samples will be given); if not improved in 5-7 days she will undergo a gastric emptying scan

## 2011-07-26 NOTE — Patient Instructions (Signed)
We are giving you Dexilant samples today Call back in one week to let us know how your doing

## 2011-07-26 NOTE — Progress Notes (Signed)
History of Present Illness:  Mrs. Wileman has returned following upper endoscopy. Exam was unremarkable. Although dexilant was ordered this was denied by her insurance company. Symptoms continue.    Review of Systems: Pertinent positive and negative review of systems were noted in the above HPI section. All other review of systems were otherwise negative.    Current Medications, Allergies, Past Medical History, Past Surgical History, Family History and Social History were reviewed in [employer] [facility] electronic medical record  Vital signs were reviewed in today's medical record. Physical Exam: General: Well developed , well nourished, no acute distress

## 2012-02-24 ENCOUNTER — Other Ambulatory Visit: Payer: Self-pay | Admitting: Obstetrics and Gynecology

## 2012-02-24 DIAGNOSIS — Z1231 Encounter for screening mammogram for malignant neoplasm of breast: Secondary | ICD-10-CM

## 2012-05-07 ENCOUNTER — Ambulatory Visit: Payer: BC Managed Care – PPO

## 2012-05-14 ENCOUNTER — Ambulatory Visit: Payer: BC Managed Care – PPO

## 2012-05-21 ENCOUNTER — Ambulatory Visit
Admission: RE | Admit: 2012-05-21 | Discharge: 2012-05-21 | Disposition: A | Discharge status: Home / Self Care | Payer: BC Managed Care – PPO | Source: Ambulatory Visit | Attending: Obstetrics and Gynecology | Admitting: Obstetrics and Gynecology

## 2012-05-21 DIAGNOSIS — Z1231 Encounter for screening mammogram for malignant neoplasm of breast: Secondary | ICD-10-CM

## 2012-10-01 ENCOUNTER — Encounter: Payer: Self-pay | Admitting: Gastroenterology

## 2012-11-06 ENCOUNTER — Ambulatory Visit (AMBULATORY_SURGERY_CENTER): Payer: BC Managed Care – PPO | Admitting: *Deleted

## 2012-11-06 ENCOUNTER — Encounter: Payer: Self-pay | Admitting: Gastroenterology

## 2012-11-06 VITALS — Ht 66.0 in | Wt 164.0 lb

## 2012-11-06 DIAGNOSIS — Z1211 Encounter for screening for malignant neoplasm of colon: Secondary | ICD-10-CM

## 2012-11-06 MED ORDER — NA SULFATE-K SULFATE-MG SULF 17.5-3.13-1.6 GM/177ML PO SOLN: ORAL | Status: DC

## 2012-11-13 ENCOUNTER — Ambulatory Visit (AMBULATORY_SURGERY_CENTER): Payer: BC Managed Care – PPO | Admitting: Gastroenterology

## 2012-11-13 ENCOUNTER — Encounter: Payer: Self-pay | Admitting: Gastroenterology

## 2012-11-13 VITALS — BP 142/67 | HR 72 | Temp 96.5°F | Resp 23 | Ht 66.0 in | Wt 164.0 lb

## 2012-11-13 DIAGNOSIS — D126 Benign neoplasm of colon, unspecified: Secondary | ICD-10-CM

## 2012-11-13 DIAGNOSIS — K5730 Diverticulosis of large intestine without perforation or abscess without bleeding: Secondary | ICD-10-CM

## 2012-11-13 DIAGNOSIS — Z1211 Encounter for screening for malignant neoplasm of colon: Secondary | ICD-10-CM

## 2012-11-13 MED ORDER — SODIUM CHLORIDE 0.9 % IV SOLN: 500.0000 mL | INTRAVENOUS | Status: DC

## 2012-11-13 NOTE — Progress Notes (Signed)
Patient did not experience any of the following events: a burn prior to discharge; a fall within the facility; wrong site/side/patient/procedure/implant event; or a hospital transfer or hospital admission upon discharge from the facility. (G8907) Patient did not have preoperative order for IV antibiotic SSI prophylaxis. (G8918)  

## 2012-11-13 NOTE — Op Note (Signed)
[facility] [address] [place], [address]   COLONOSCOPY PROCEDURE REPORT  PATIENT: Debra Buchanan, Debra Buchanan  MR#: [id number] BIRTHDATE: [birth year] , 56  yrs. old GENDER: Female ENDOSCOPIST: Louis Meckel, MD REFERRED Raphael Gibney, M.D. PROCEDURE DATE:  11/13/2012 PROCEDURE:   Colonoscopy with snare polypectomy ASA CLASS:   Class II INDICATIONS:average risk screening. MEDICATIONS: MAC sedation, administered by CRNA and propofol (Diprivan) 500mg  IV  DESCRIPTION OF PROCEDURE:   After the risks benefits and alternatives of the procedure were thoroughly explained, informed consent was obtained.  A digital rectal exam revealed no abnormalities of the rectum.   The LB [id number]-[id number]  endoscope was introduced through the anus and advanced to the cecum, which was identified by both the appendix and ileocecal valve. No adverse events experienced.   The quality of the prep was Suprep excellent The instrument was then slowly withdrawn as the colon was fully examined.      COLON FINDINGS: A sessile polyp measuring 10 mm in size was found in the descending colon.  A polypectomy was performed using snare cautery.  The resection was complete and the polyp tissue was completely retrieved.   Moderate diverticulosis was noted in the sigmoid colon, descending colon, transverse colon, and ascending colon.   The colon mucosa was otherwise normal.  Retroflexed views revealed no abnormalities. The time to cecum=8 minutes 27 seconds. Withdrawal time=12 minutes 30 seconds.  The scope was withdrawn and the procedure completed. COMPLICATIONS: There were no complications.  ENDOSCOPIC IMPRESSION: 1.   Sessile polyp measuring 10 mm in size was found in the descending colon; polypectomy was performed using snare cautery 2.   Moderate diverticulosis was noted in the sigmoid colon, descending colon, transverse colon, and ascending colon 3.   The colon mucosa was otherwise  normal  RECOMMENDATIONS: If the polyp(s) removed today are proven to be adenomatous (pre-cancerous) polyps, you will need a repeat colonoscopy in 5 years.  Otherwise you should continue to follow colorectal cancer screening guidelines for "routine risk" patients with colonoscopy in 10 years.  You will receive a letter within 1-2 weeks with the results of your biopsy as well as final recommendations.  Please call my office if you have not received a letter after 3 weeks.   eSigned:  Louis Meckel, MD 11/13/2012 11:53 AM   cc:   PATIENT NAME:  Debra Buchanan, Debra Buchanan MR#: [id number]

## 2012-11-13 NOTE — Progress Notes (Signed)
Called to room to assist during endoscopic procedure.  Patient ID and intended procedure confirmed with present staff. Received instructions for my participation in the procedure from the performing physician.  

## 2012-11-13 NOTE — Patient Instructions (Addendum)
YOU HAD AN ENDOSCOPIC PROCEDURE TODAY AT THE [facility]: Refer to the procedure report that was given to you for any specific questions about what was found during the examination.  If the procedure report does not answer your questions, please call your gastroenterologist to clarify.  If you requested that your care partner not be given the details of your procedure findings, then the procedure report has been included in a sealed envelope for you to review at your convenience later.  YOU SHOULD EXPECT: Some feelings of bloating in the abdomen. Passage of more gas than usual.  Walking can help get rid of the air that was put into your GI tract during the procedure and reduce the bloating. If you had a lower endoscopy (such as a colonoscopy or flexible sigmoidoscopy) you may notice spotting of blood in your stool or on the toilet paper. If you underwent a bowel prep for your procedure, then you may not have a normal bowel movement for a few days.  DIET: Your first meal following the procedure should be a light meal and then it is ok to progress to your normal diet.  A half-sandwich or bowl of soup is an example of a good first meal.  Heavy or fried foods are harder to digest and may make you feel nauseous or bloated.  Likewise meals heavy in dairy and vegetables can cause extra gas to form and this can also increase the bloating.  Drink plenty of fluids but you should avoid alcoholic beverages for 24 hours.  ACTIVITY: Your care partner should take you home directly after the procedure.  You should plan to take it easy, moving slowly for the rest of the day.  You can resume normal activity the day after the procedure however you should NOT DRIVE or use heavy machinery for 24 hours (because of the sedation medicines used during the test).    SYMPTOMS TO REPORT IMMEDIATELY: A gastroenterologist can be reached at any hour.  During normal business hours, 8:30 AM to 5:00 PM Monday through Friday,  call [phone].  After hours and on weekends, please call the GI answering service at [phone] who will take a message and have the physician on call contact you.   Following lower endoscopy (colonoscopy or flexible sigmoidoscopy):  Excessive amounts of blood in the stool  Significant tenderness or worsening of abdominal pains  Swelling of the abdomen that is new, acute  Fever of 100F or higher    FOLLOW UP: If any biopsies were taken you will be contacted by phone or by letter within the next 1-3 weeks.  Call your gastroenterologist if you have not heard about the biopsies in 3 weeks.  Our staff will call the home number listed on your records the next business day following your procedure to check on you and address any questions or concerns that you may have at that time regarding the information given to you following your procedure. This is a courtesy call and so if there is no answer at the home number and we have not heard from you through the emergency physician on call, we will assume that you have returned to your regular daily activities without incident.  SIGNATURES/CONFIDENTIALITY: You and/or your care partner have signed paperwork which will be entered into your electronic medical record.  These signatures attest to the fact that that the information above on your After Visit Summary has been reviewed and is understood.  Full responsibility of the confidentiality   of this discharge information lies with you and/or your care-partner.    Information on polyps,diverticulosis,& high fiber diet given to you.

## 2012-11-14 ENCOUNTER — Telehealth: Payer: Self-pay | Admitting: *Deleted

## 2012-11-14 NOTE — Telephone Encounter (Signed)
  Follow up Call-  Call back number 11/13/2012 06/24/2011  Post procedure Call Back phone  # [phone]  Permission to leave phone message Yes -     Patient questions:  Do you have a fever, pain , or abdominal swelling? no Pain Score  0 *  Have you tolerated food without any problems? yes  Have you been able to return to your normal activities? yes  Do you have any questions about your discharge instructions: Diet   no Medications  no Follow up visit  no  Do you have questions or concerns about your Care? no  Actions: * If pain score is 4 or above: No action needed, pain <4.

## 2012-11-22 ENCOUNTER — Encounter: Payer: Self-pay | Admitting: Gastroenterology

## 2013-01-11 ENCOUNTER — Other Ambulatory Visit: Payer: Self-pay | Admitting: Neurology

## 2013-01-14 LAB — LYME, TOTAL AB TEST/REFLEX: Lyme Ab: <0.91 index (ref 0.00–0.90)

## 2013-01-14 LAB — ANA W/REFLEX: Anti Nuclear Antibody(ANA): NEGATIVE

## 2013-01-14 LAB — C-REACTIVE PROTEIN: CRP: 0.3 mg/L (ref 0.0–4.9)

## 2013-01-14 LAB — SEDIMENTATION RATE: Sed Rate: 15 mm/hr (ref 0–40)

## 2013-01-18 ENCOUNTER — Telehealth: Payer: Self-pay | Admitting: Neurology

## 2013-01-26 ENCOUNTER — Ambulatory Visit (INDEPENDENT_AMBULATORY_CARE_PROVIDER_SITE_OTHER): Payer: BC Managed Care – PPO | Admitting: Physician Assistant

## 2013-01-26 VITALS — BP 132/78 | HR 65 | Temp 97.3°F | Resp 18 | Ht 65.0 in | Wt 161.4 lb

## 2013-01-26 DIAGNOSIS — R202 Paresthesia of skin: Secondary | ICD-10-CM

## 2013-01-26 DIAGNOSIS — R209 Unspecified disturbances of skin sensation: Secondary | ICD-10-CM

## 2013-01-26 DIAGNOSIS — R059 Cough, unspecified: Secondary | ICD-10-CM

## 2013-01-26 DIAGNOSIS — K1370 Unspecified lesions of oral mucosa: Secondary | ICD-10-CM

## 2013-01-26 DIAGNOSIS — J209 Acute bronchitis, unspecified: Secondary | ICD-10-CM

## 2013-01-26 DIAGNOSIS — R05 Cough: Secondary | ICD-10-CM

## 2013-01-26 LAB — POCT CBC
Granulocyte percent: 49.6 %G (ref 37–80)
HCT, POC: 34.9 % — AB (ref 37.7–47.9)
Hemoglobin: 11.1 g/dL — AB (ref 12.2–16.2)
Lymph, poc: 2.4 (ref 0.6–3.4)
MCH, POC: 29.5 pg (ref 27–31.2)
MCHC: 31.8 g/dL (ref 31.8–35.4)
MCV: 92.9 fL (ref 80–97)
MID (cbc): 0.5 (ref 0–0.9)
MPV: 8 fL (ref 0–99.8)
POC Granulocyte: 2.9 (ref 2–6.9)
POC LYMPH PERCENT: 41.2 %L (ref 10–50)
POC MID %: 9.2 %M (ref 0–12)
Platelet Count, POC: 270 10*3/uL (ref 142–424)
RBC: 3.76 M/uL — AB (ref 4.04–5.48)
RDW, POC: 13.7 %
WBC: 5.8 10*3/uL (ref 4.6–10.2)

## 2013-01-26 LAB — VITAMIN B12: Vitamin B-12: 789 pg/mL (ref 211–911)

## 2013-01-26 LAB — GLUCOSE, POCT (MANUAL RESULT ENTRY): POC Glucose: 85 mg/dl (ref 70–99)

## 2013-01-26 MED ORDER — AZITHROMYCIN 250 MG PO TABS: ORAL_TABLET | ORAL | Status: DC

## 2013-01-26 MED ORDER — METHYLPREDNISOLONE (PAK) 4 MG PO TABS: ORAL_TABLET | ORAL | Status: DC

## 2013-01-26 MED ORDER — HYDROCODONE-HOMATROPINE 5-1.5 MG/5ML PO SYRP: 5.0000 mL | ORAL_SOLUTION | Freq: Three times a day (TID) | ORAL | Status: DC | PRN

## 2013-01-26 NOTE — Progress Notes (Signed)
Subjective:    Patient ID: Debra Buchanan, female    DOB: 04-15-1956, 57 y.o.   MRN: [id number]  HPI 57 year old female presents with 1 week history of cough, rhinorrhea, nasal congestion, subjective wheezing, and chest tightness.  States symptoms started suddenly after an episode of sneezing.  At onset she had a lot of PND and nasal drainage. This has improved slightly.  Does have a history of seasonal allergies especially in the spring.  Has taken 1 or 2 doses of benadryl and several doses of Mucinex which has not helped much.  No history of asthma. She is a nonsmoker.    Also has another concern today. Has had intermittent episodes of oral lesions/ulcers that have been occuring over the last few months. Has seen her dentist about this and he prescribed Magic Mouthwash which has helped some.  Does not have any lesions today, but is wondering if she could get her Vitamin B level checked. She was looking up symptoms online and has become concerned about a vitamin B deficiency. She has also seen a neurologist for peripheral paresthesias - currently on gabapentin.     Review of Systems  Constitutional: Negative for fever and chills.  HENT: Positive for congestion, rhinorrhea and sneezing. Negative for ear pain, sore throat and postnasal drip.   Respiratory: Positive for cough, chest tightness and shortness of breath. Negative for wheezing.   Cardiovascular: Negative for chest pain.  Gastrointestinal: Negative for nausea, vomiting and abdominal pain.  Allergic/Immunologic: Positive for environmental allergies.  Neurological: Negative for dizziness and headaches.       Objective:   Physical Exam  Constitutional: She is oriented to person, place, and time. She appears well-developed and well-nourished.  HENT:  Head: Normocephalic and atraumatic.  Right Ear: External ear normal.  Left Ear: External ear normal.  Mouth/Throat: Oropharynx is clear and moist. No oropharyngeal exudate (clear  postnasal drainage).  Eyes: Conjunctivae are normal.  Neck: Normal range of motion. Neck supple.  Cardiovascular: Normal rate, regular rhythm and normal heart sounds.   Pulmonary/Chest: Effort normal and breath sounds normal.  Lymphadenopathy:    She has no cervical adenopathy.  Neurological: She is alert and oriented to person, place, and time.  Psychiatric: She has a normal mood and affect. Her behavior is normal. Judgment and thought content normal.     Results for orders placed in visit on 01/26/13  POCT CBC      Result Value Range   WBC 5.8  4.6 - 10.2 K/uL   Lymph, poc 2.4  0.6 - 3.4   POC LYMPH PERCENT 41.2  10 - 50 %L   MID (cbc) 0.5  0 - 0.9   POC MID % 9.2  0 - 12 %M   POC Granulocyte 2.9  2 - 6.9   Granulocyte percent 49.6  37 - 80 %G   RBC 3.76 (*) 4.04 - 5.48 M/uL   Hemoglobin 11.1 (*) 12.2 - 16.2 g/dL   HCT, POC 40.9 (*) 81.1 - 47.9 %   MCV 92.9  80 - 97 fL   MCH, POC 29.5  27 - 31.2 pg   MCHC 31.8  31.8 - 35.4 g/dL   RDW, POC 91.4     Platelet Count, POC 270  142 - 424 K/uL   MPV 8.0  0 - 99.8 fL  GLUCOSE, POCT (MANUAL RESULT ENTRY)      Result Value Range   POC Glucose 85  70 - 99 mg/dl  Assessment & Plan:  Cough - Plan: methylPREDNIsolone (MEDROL DOSPACK) 4 MG tablet, HYDROcodone-homatropine (HYCODAN) 5-1.5 MG/5ML syrup  Oral lesion - Plan: POCT CBC, Vitamin B12  Paresthesias - Plan: POCT glucose (manual entry)  Acute bronchitis - Plan: azithromycin (ZITHROMAX) 250 MG tablet  Patient here with non-productive cough and rhinorrhea - likely related to AR and RAD Will go ahead and treat with a zpack and medrol dose pack Hycodan qhs prn cough - hold Lunesta while taking this Increase fluids and rest. Recommend OTC zyrtec daily to help with rhinorrhea She is slightly anemic today although this appears to be stable - patient admits to a hx of anemia and is on a multivitamin. Recommend additional iron supplementation. B12 level pending Follow up if  symptoms worsen or fail to improve

## 2013-02-07 ENCOUNTER — Ambulatory Visit (INDEPENDENT_AMBULATORY_CARE_PROVIDER_SITE_OTHER): Payer: BC Managed Care – PPO | Admitting: Emergency Medicine

## 2013-02-07 VITALS — BP 137/80 | HR 66 | Temp 98.0°F | Resp 16 | Ht 65.0 in | Wt 162.0 lb

## 2013-02-07 DIAGNOSIS — K120 Recurrent oral aphthae: Secondary | ICD-10-CM

## 2013-02-07 MED ORDER — TRIAMCINOLONE ACETONIDE 0.1 % MT PSTE: PASTE | OROMUCOSAL | Status: DC

## 2013-02-07 NOTE — Progress Notes (Signed)
[facility] [address], [place] [address] [phone]  Date:  02/07/2013   Name:  Debra Buchanan   DOB:  02/15/1956   MRN:  [id number]  PCP:  Carylon Perches, MD    Chief Complaint: blisters on tongue and Insect Bite   History of Present Illness:  Debra Buchanan is a 57 y.o. very pleasant female patient who presents with the following:  Has three month history of intraoral lesions predominantly on the tongue.  Saw her dentist who told her to use Dukes' mouthwash. She has done so with little improvement.  Has persistent lesion on the tip of her tongue that is painful.  No improvement with over the counter medications or other home remedies.. Denies other complaint or health concern today.   Patient Active Problem List   Diagnosis Date Noted  . Esophageal reflux 06/21/2011  . Special screening for malignant neoplasms, colon 06/21/2011    Past Medical History  Diagnosis Date  . Anxiety   . Bipolar disorder   . Anemia   . Allergy     Past Surgical History  Procedure Laterality Date  . Cesarean section    . Carpal tunnel release    . Tubal ligation      History  Substance Use Topics  . Smoking status: Never Smoker   . Smokeless tobacco: Never Used  . Alcohol Use: 1.0 oz/week    2 drink(s) per week     Comment: occasional    Family History  Problem Relation Age of Onset  . Breast cancer Mother   . Cancer Mother     breast  . Emphysema Father   . Colon cancer Neg Hx   . Esophageal cancer Neg Hx   . Stomach cancer Neg Hx     Allergies  Allergen Reactions  . Iodine Swelling  . Penicillins     Unsure of reaction    Medication list has been reviewed and updated.  Current Outpatient Prescriptions on File Prior to Visit  Medication Sig Dispense Refill  . azithromycin (ZITHROMAX) 250 MG tablet Take 2 tabs PO x 1 dose, then 1 tab PO QD x 4 days  6 tablet  0  . calcium citrate (CALCITRATE - DOSED IN MG ELEMENTAL CALCIUM) 950 MG tablet  Take 1 tablet by mouth daily.      . Eszopiclone 3 MG TABS Take 3 mg by mouth at bedtime. Take immediately before bedtime       . gabapentin (NEURONTIN) 100 MG capsule Take 100 mg by mouth 3 (three) times daily.      Marland Kitchen HYDROcodone-homatropine (HYCODAN) 5-1.5 MG/5ML syrup Take 5 mLs by mouth every 8 (eight) hours as needed for cough.  120 mL  0  . lamoTRIgine (LAMICTAL) 200 MG tablet Take 200 mg by mouth daily.        . Melatonin 3 MG TABS Take 2 tablets by mouth at bedtime and may repeat dose one time if needed.        . methylPREDNIsolone (MEDROL DOSPACK) 4 MG tablet follow package directions  21 tablet  0  . pilocarpine (SALAGEN) 5 MG tablet Take 5 mg by mouth 3 (three) times daily.       No current facility-administered medications on file prior to visit.    Review of Systems:  As per HPI, otherwise negative.    Physical Examination: Filed Vitals:   02/07/13 1749  BP: 137/80  Pulse: 66  Temp: 98 F (36.7 C)  Resp: 16   Filed Vitals:   02/07/13 1749  Height: 5\' 5"  (1.651 m)  Weight: 162 lb (73.483 kg)   Body mass index is 26.96 kg/(m^2). Ideal Body Weight: Weight in (lb) to have BMI = 25: 149.9   GEN: WDWN, NAD, Non-toxic, Alert & Oriented x 3 HEENT: Atraumatic, Normocephalic.  Ears and Nose: No external deformity. EXTR: No clubbing/cyanosis/edema NEURO: Normal gait.  PSYCH: Normally interactive. Conversant. Not depressed or anxious appearing.  Calm demeanor.  Hypertrophied papillae on the tip of the tongue that are prominent and tender.  Assessment and Plan: Intraoral lesions Follow up with dentist B complex vitamin  Kenalog in orabase   Signed,  Phillips Odor, MD

## 2013-03-29 ENCOUNTER — Other Ambulatory Visit: Payer: Self-pay

## 2013-03-29 DIAGNOSIS — Z1231 Encounter for screening mammogram for malignant neoplasm of breast: Secondary | ICD-10-CM

## 2013-05-22 ENCOUNTER — Ambulatory Visit: Payer: BC Managed Care – PPO

## 2013-05-23 ENCOUNTER — Ambulatory Visit: Payer: BC Managed Care – PPO | Admitting: Neurology

## 2013-05-27 ENCOUNTER — Ambulatory Visit
Admission: RE | Admit: 2013-05-27 | Discharge: 2013-05-27 | Disposition: A | Discharge status: Home / Self Care | Payer: BC Managed Care – PPO | Source: Ambulatory Visit

## 2013-05-27 DIAGNOSIS — Z1231 Encounter for screening mammogram for malignant neoplasm of breast: Secondary | ICD-10-CM

## 2013-10-18 NOTE — Telephone Encounter (Signed)
Closing encounter

## 2013-12-05 DIAGNOSIS — Z0289 Encounter for other administrative examinations: Secondary | ICD-10-CM

## 2015-02-04 ENCOUNTER — Ambulatory Visit (INDEPENDENT_AMBULATORY_CARE_PROVIDER_SITE_OTHER): Payer: BC Managed Care – PPO

## 2015-02-04 ENCOUNTER — Ambulatory Visit (INDEPENDENT_AMBULATORY_CARE_PROVIDER_SITE_OTHER): Payer: BC Managed Care – PPO | Admitting: Orthopedic Surgery

## 2015-02-04 VITALS — BP 119/71 | Ht 65.0 in

## 2015-02-04 DIAGNOSIS — M75101 Unspecified rotator cuff tear or rupture of right shoulder, not specified as traumatic: Secondary | ICD-10-CM

## 2015-02-04 DIAGNOSIS — M25511 Pain in right shoulder: Secondary | ICD-10-CM | POA: Diagnosis not present

## 2015-02-04 NOTE — Progress Notes (Signed)
Patient ID: Debra Buchanan, female   DOB: 06/01/1956, 59 y.o.   MRN: [id number] New patient with new problem  Chief Complaint  Patient presents with  . Shoulder Pain    right shoulder pain x 6 months, no known injury, ref z hall     Debra Buchanan is a 59 y.o. female.   HPI 59 year old female 6 month history of right shoulder pain which actually started in her left shoulder. She was since her therapy in the left shoulder improved but the right shoulder got worse.  She complains of constant sharp aching pain posterior joint line posterior acromial region. She's already had physical therapy, Flexeril 10 mg, diclofenac 75 mg and cryotherapy with mild improvement. She was actually getting worse while having physical therapy. She reports no trauma. She does not report weakness in the right shoulder.  The review of systems is negative for numbness tingling neck pain skin rash or fever chills.   Review of Systems See hpi  Past Medical History  Diagnosis Date  . Anxiety   . Bipolar disorder   . Anemia   . Allergy     Past Surgical History  Procedure Laterality Date  . Cesarean section    . Carpal tunnel release    . Tubal ligation      Family History  Problem Relation Age of Onset  . Breast cancer Mother   . Cancer Mother     breast  . Emphysema Father   . Colon cancer Neg Hx   . Esophageal cancer Neg Hx   . Stomach cancer Neg Hx     Social History History  Substance Use Topics  . Smoking status: Never Smoker   . Smokeless tobacco: Never Used  . Alcohol Use: 1.0 oz/week    2 drink(s) per week     Comment: occasional    Allergies  Allergen Reactions  . Iodine Swelling  . Penicillins     Unsure of reaction    Current Outpatient Prescriptions  Medication Sig Dispense Refill  . azithromycin (ZITHROMAX) 250 MG tablet Take 2 tabs PO x 1 dose, then 1 tab PO QD x 4 days 6 tablet 0  . calcium citrate (CALCITRATE - DOSED IN MG ELEMENTAL CALCIUM) 950 MG tablet Take  1 tablet by mouth daily.    . Eszopiclone 3 MG TABS Take 3 mg by mouth at bedtime. Take immediately before bedtime     . gabapentin (NEURONTIN) 100 MG capsule Take 100 mg by mouth 3 (three) times daily.    Marland Kitchen HYDROcodone-homatropine (HYCODAN) 5-1.5 MG/5ML syrup Take 5 mLs by mouth every 8 (eight) hours as needed for cough. 120 mL 0  . lamoTRIgine (LAMICTAL) 200 MG tablet Take 200 mg by mouth daily.      . Melatonin 3 MG TABS Take 2 tablets by mouth at bedtime and may repeat dose one time if needed.      . methylPREDNIsolone (MEDROL DOSPACK) 4 MG tablet follow package directions 21 tablet 0  . pilocarpine (SALAGEN) 5 MG tablet Take 5 mg by mouth 3 (three) times daily.    Marland Kitchen triamcinolone (KENALOG) 0.1 % paste Apply intraorally tid 10 g 1   No current facility-administered medications for this visit.       Physical Exam Blood pressure 119/71, height '5\' 5"'  (1.651 m). Physical Exam The patient is well developed well nourished and well groomed. Orientation to person place and time is normal  Mood is pleasant. Ambulatory status there is no problem  with her ambulatory status Her left shoulder range of motion she has a positive sulcus sign normal motor function scans intact  Right shoulder has posterior acromial tenderness superior tenderness in the supraspinatus fossa anterior rotator interval tenderness. Range of motion is painful at 150 280 of flexion she has a positive impingement sign by Neer and Hawkins maneuvers. She has an inferior sulcus sign as well. Her rotator cuff strength is 5 minus out of 5 secondary to pain most likely. Scans intact sensations normal pulses are good lymph nodes are negative    Data Reviewed X-ray ordered right shoulder x-ray interpretation I see no major abnormalities no fracture nor arthritis no cancer or bone lesion   Assessment   Cuff syndrome right shoulder   Plan Inject right shoulder subacromial space return in 3 weeks if no improvement she will  have met the criteria for MRI   Procedure note the subacromial injection shoulder RIGHT  Verbal consent was obtained to inject the  RIGHT   Shoulder  Timeout was completed to confirm the injection site is a subacromial space of the  RIGHT  shoulder   Medication used Depo-Medrol 40 mg and lidocaine 1% 3 cc  Anesthesia was provided by ethyl chloride  The injection was performed in the RIGHT  posterior subacromial space. After pinning the skin with alcohol and anesthetized the skin with ethyl chloride the subacromial space was injected using a 20-gauge needle. There were no complications  Sterile dressing was applied.

## 2015-02-04 NOTE — Patient Instructions (Signed)
Joint Injection  Care After  Refer to this sheet in the next few days. These instructions provide you with information on caring for yourself after you have had a joint injection. Your caregiver also may give you more specific instructions. Your treatment has been planned according to current medical practices, but problems sometimes occur. Call your caregiver if you have any problems or questions after your procedure.  After any type of joint injection, it is not uncommon to experience:  · Soreness, swelling, or bruising around the injection site.  · Mild numbness, tingling, or weakness around the injection site caused by the numbing medicine used before or with the injection.  It also is possible to experience the following effects associated with the specific agent after injection:  · Iodine-based contrast agents:  ¨ Allergic reaction (itching, hives, widespread redness, and swelling beyond the injection site).  · Corticosteroids (These effects are rare.):  ¨ Allergic reaction.  ¨ Increased blood sugar levels (If you have diabetes and you notice that your blood sugar levels have increased, notify your caregiver).  ¨ Increased blood pressure levels.  ¨ Mood swings.  · Hyaluronic acid in the use of viscosupplementation.  ¨ Temporary heat or redness.  ¨ Temporary rash and itching.  ¨ Increased fluid accumulation in the injected joint.  These effects all should resolve within a day after your procedure.   HOME CARE INSTRUCTIONS  · Limit yourself to light activity the day of your procedure. Avoid lifting heavy objects, bending, stooping, or twisting.  · Take prescription or over-the-counter pain medication as directed by your caregiver.  · You may apply ice to your injection site to reduce pain and swelling the day of your procedure. Ice may be applied 03-04 times:  ¨ Put ice in a plastic bag.  ¨ Place a towel between your skin and the bag.  ¨ Leave the ice on for no longer than 15-20 minutes each time.  SEEK  IMMEDIATE MEDICAL CARE IF:   · Pain and swelling get worse rather than better or extend beyond the injection site.  · Numbness does not go away.  · Blood or fluid continues to leak from the injection site.  · You have chest pain.  · You have swelling of your face or tongue.  · You have trouble breathing or you become dizzy.  · You develop a fever, chills, or severe tenderness at the injection site that last longer than 1 day.  MAKE SURE YOU:  · Understand these instructions.  · Watch your condition.  · Get help right away if you are not doing well or if you get worse.  Document Released: 04/28/2011 Document Revised: 11/07/2011 Document Reviewed: 04/28/2011  ExitCare® Patient Information ©2015 [employer]. This information is not intended to replace advice given to you by your health care provider. Make sure you discuss any questions you have with your health care provider.

## 2015-02-24 ENCOUNTER — Encounter: Payer: Self-pay | Admitting: Orthopedic Surgery

## 2015-02-24 ENCOUNTER — Ambulatory Visit: Payer: BC Managed Care – PPO | Admitting: Orthopedic Surgery

## 2015-03-09 ENCOUNTER — Ambulatory Visit: Payer: BC Managed Care – PPO | Admitting: Orthopedic Surgery

## 2015-03-16 ENCOUNTER — Ambulatory Visit (INDEPENDENT_AMBULATORY_CARE_PROVIDER_SITE_OTHER): Payer: BC Managed Care – PPO | Admitting: Orthopedic Surgery

## 2015-03-16 ENCOUNTER — Encounter: Payer: Self-pay | Admitting: Orthopedic Surgery

## 2015-03-16 VITALS — BP 128/88 | Ht 65.0 in | Wt 163.2 lb

## 2015-03-16 DIAGNOSIS — M75101 Unspecified rotator cuff tear or rupture of right shoulder, not specified as traumatic: Secondary | ICD-10-CM

## 2015-03-16 NOTE — Patient Instructions (Signed)
Continue diclofenac as needed  We will schedule MRI for you and call you with results (patient needs early morning appt)

## 2015-03-16 NOTE — Progress Notes (Signed)
Patient ID: ZOHA SPRANGER, female   DOB: 04/20/1956, 59 y.o.   MRN: [id number]  Follow up visit  Chief Complaint  Patient presents with  . Follow-up    4 week recheck right shoulder s/p injection    BP 128/88 mmHg  Ht 5\' 5"  (1.651 m)  Wt 163 lb 3.2 oz (74.027 kg)  BMI 27.16 kg/m2  Encounter Diagnosis  Name Primary?  . Right rotator cuff tear Yes    59 year old female with right shoulder pain status post injection, Flexeril, diclofenac cryotherapy in physical therapy contains right shoulder pain anterior joint line radiating up into the cervical spine without radicular symptoms  She's been treated now for a full 8 weeks if not more and continues to have pain in the anterior joint line of the shoulder and somewhat with forward elevation  Her exam shows decreased internal rotation tenderness in the rotator interval neurovascular exam is intact neck exam is normal. Mild weakness in the supraspinatus tendon normal external and internal rotation strength  Mood is pleasant she is oriented 3 vitals are stable  Recommend MRI to delineate Rotator cuff syndrome versus Rotator cuff tear versus Biceps tendinitis (speed sign for biceps tendinitis was positive)

## 2015-04-07 ENCOUNTER — Other Ambulatory Visit (HOSPITAL_COMMUNITY): Payer: BC Managed Care – PPO

## 2015-04-13 ENCOUNTER — Telehealth: Payer: Self-pay | Admitting: Orthopedic Surgery

## 2015-04-13 NOTE — Telephone Encounter (Signed)
REALLY???  TODAY????   [facility]   No!

## 2015-04-13 NOTE — Telephone Encounter (Signed)
Patient stopped by office; states has now re-located to [place] (new job).  States she came back to town today and is asking if a right shoulder injection is still an option, as discussed at last visit.  She had been pre-cert'd and scheduled for an MRI 03/26/15, and had cancelled it due to her recent re-location.  Is an injection still indicated, since ?  If so, patient requests it today, as she is in town.  Please review and advise.

## 2015-04-13 NOTE — Telephone Encounter (Signed)
I had already advised patient that based on the schedule and emergencies from weekend, this would not be possible today while she was in town from [place].  Patient signed a release of information for her records so that she may also transfer to a provider there when she is settled.

## 2016-02-08 ENCOUNTER — Other Ambulatory Visit: Payer: Self-pay | Admitting: Neurology

## 2016-02-08 DIAGNOSIS — R253 Fasciculation: Secondary | ICD-10-CM

## 2016-02-14 ENCOUNTER — Other Ambulatory Visit: Payer: BC Managed Care – PPO

## 2016-02-17 ENCOUNTER — Ambulatory Visit: Payer: BC Managed Care – PPO | Admitting: Neurology

## 2016-02-23 ENCOUNTER — Telehealth: Payer: Self-pay | Admitting: Neurology

## 2016-02-23 NOTE — Telephone Encounter (Signed)
Dr Clelia Schaumann saw you in 2014. She has been referred back here but wants to see another doctor. Is it ok to switch to another doctor? Please advise. Thanks dg

## 2016-02-24 NOTE — Telephone Encounter (Signed)
It is Ok to seen another doctor

## 2016-02-26 ENCOUNTER — Ambulatory Visit
Admission: RE | Admit: 2016-02-26 | Discharge: 2016-02-26 | Disposition: A | Discharge status: Home / Self Care | Payer: BC Managed Care – PPO | Source: Ambulatory Visit | Attending: Neurology | Admitting: Neurology

## 2016-02-26 DIAGNOSIS — R253 Fasciculation: Secondary | ICD-10-CM

## 2016-02-26 MED ORDER — GADOBENATE DIMEGLUMINE 529 MG/ML IV SOLN
15.0000 mL | Freq: Once | INTRAVENOUS | Status: AC | PRN
  Administered 2016-02-26: 15 mL via INTRAVENOUS

## 2016-03-03 ENCOUNTER — Telehealth: Payer: Self-pay | Admitting: Neurology

## 2016-03-03 NOTE — Telephone Encounter (Signed)
This pt has seen Dr Krista Blue in the past but no longer wants to see her. Dr Krista Blue has ok'ed the pt to see another doctor. Is it ok to schedule the pt with you? Please advise dg

## 2016-03-03 NOTE — Telephone Encounter (Signed)
Thank you :)

## 2016-03-03 NOTE — Telephone Encounter (Signed)
Okay with me, the patient has not been seen in greater than 3 years, this would be a new patient evaluation.

## 2016-03-23 ENCOUNTER — Ambulatory Visit (INDEPENDENT_AMBULATORY_CARE_PROVIDER_SITE_OTHER): Payer: BC Managed Care – PPO | Admitting: Neurology

## 2016-03-23 ENCOUNTER — Encounter: Payer: Self-pay | Admitting: Neurology

## 2016-03-23 VITALS — BP 132/80 | HR 72 | Ht 65.5 in | Wt 166.0 lb

## 2016-03-23 DIAGNOSIS — R202 Paresthesia of skin: Secondary | ICD-10-CM | POA: Diagnosis not present

## 2016-03-23 DIAGNOSIS — F958 Other tic disorders: Secondary | ICD-10-CM | POA: Insufficient documentation

## 2016-03-23 DIAGNOSIS — F959 Tic disorder, unspecified: Secondary | ICD-10-CM | POA: Diagnosis not present

## 2016-03-23 DIAGNOSIS — R259 Unspecified abnormal involuntary movements: Secondary | ICD-10-CM | POA: Diagnosis not present

## 2016-03-23 MED ORDER — CLONAZEPAM 0.5 MG PO TABS: 0.5000 mg | ORAL_TABLET | Freq: Two times a day (BID) | ORAL | 3 refills | Status: DC

## 2016-03-23 NOTE — Progress Notes (Signed)
Reason for visit: Paresthesias  Referring physician: Dr. Mackie Pai is a 60 y.o. female  History of present illness:  Debra Buchanan is a 60 year old right-handed white female with a long-standing history of burning sensations in the feet. The patient has had issues for over 10 years, she has had nerve conduction studies around 5 years ago that she was told did not show a peripheral neuropathy. The patient has had gradual worsening of the symptoms with symptoms that are worse when she is off of her feet, she finds that wearing shoes is uncomfortable. She has symptoms that are more prominent at nighttime when she tries to go to sleep. The patient denies any significant balance issues, and no falls. She denies back pain or neck discomfort or pain down the arms or legs. She also described another issue with involuntary movements involving the shoulders bilaterally, left greater than right. This issue has been most prominent over the last 2 years, but the patient had some similar issues even when she was in her teenage years. The patient has no issues as long as nothing is touching her shoulder, and when she wears a bra or has a seatbelt on, this will initiate the involuntary movements of the shoulders. The patient also has a sensation of something in her throat, with frequent coughing throughout the day, and ENT evaluation was unremarkable. The patient has chronic insomnia, she has taken Lunesta in the past without benefit. Trazodone may help some, she takes this intermittently. She is sent to this office for an evaluation. She denies any issues controlling the bowels or the bladder.  Past Medical History:  Diagnosis Date  . Allergy   . Anemia   . Anxiety   . Bipolar disorder [facility])     Past Surgical History:  Procedure Laterality Date  . CARPAL TUNNEL RELEASE  2001  . [place]  . TUBAL LIGATION      Family History  Problem Relation Age of Onset  . Breast  cancer Mother   . Emphysema Father   . Heart attack Paternal Grandmother   . Colon cancer Neg Hx   . Esophageal cancer Neg Hx   . Stomach cancer Neg Hx     Social history:  reports that she has never smoked. She has never used smokeless tobacco. She reports that she drinks about 1.0 oz of alcohol per week . She reports that she does not use drugs.  Medications:  Prior to Admission medications   Medication Sig Start Date End Date Taking? Authorizing Provider  clonazePAM (KLONOPIN) 0.5 MG tablet Take 1 tablet (0.5 mg total) by mouth 2 (two) times daily. 03/23/16   Kathrynn Ducking, MD  gabapentin (NEURONTIN) 100 MG capsule TAKE 1 TO 3 CAPSULES AT BEDTIME 02/26/16   Historical Provider, MD  lamoTRIgine (LAMICTAL) 200 MG tablet Take 200 mg by mouth daily.      Historical Provider, MD  nabumetone (RELAFEN) 500 MG tablet TAKE 1 (ONE) TABLET TWO TIMES DAILY 02/26/16   Historical Provider, MD      Allergies  Allergen Reactions  . Iodine Swelling  . Penicillins     Unsure of reaction    ROS:  Out of a complete 14 system review of symptoms, the patient complains only of the following symptoms, and all other reviewed systems are negative.  Ringing in ears, difficulty swallowing Cough, snoring Increased thirst Muscle cramps in the feet Allergies Numbness, difficulty swallowing Not enough sleep  Insomnia  Blood pressure 132/80, pulse 72, height 5' 5.5" (1.664 m), weight 166 lb (75.3 kg).  Physical Exam  General: The patient is alert and cooperative at the time of the examination.  Eyes: Pupils are equal, round, and reactive to light. Discs are flat bilaterally.  Neck: The neck is supple, no carotid bruits are noted.  Respiratory: The respiratory examination is clear.  Cardiovascular: The cardiovascular examination reveals a regular rate and rhythm, no obvious murmurs or rubs are noted.  Skin: Extremities are without significant edema.  Neurologic Exam  Mental status: The  patient is alert and oriented x 3 at the time of the examination. The patient has apparent normal recent and remote memory, with an apparently normal attention span and concentration ability.  Cranial nerves: Facial symmetry is present. There is good sensation of the face to pinprick and soft touch bilaterally. The strength of the facial muscles and the muscles to head turning and shoulder shrug are normal bilaterally. Speech is well enunciated, no aphasia or dysarthria is noted. Extraocular movements are full. Visual fields are full. The tongue is midline, and the patient has symmetric elevation of the soft palate. No obvious hearing deficits are noted.  Motor: The motor testing reveals 5 over 5 strength of all 4 extremities. Good symmetric motor tone is noted throughout. The patient does have intermittent jerking movements of the shoulders bilaterally and with the platysma.  Sensory: Sensory testing is intact to pinprick, soft touch, vibration sensation, and position sense on all 4 extremities. No evidence of extinction is noted.  Coordination: Cerebellar testing reveals good finger-nose-finger and heel-to-shin bilaterally.  Gait and station: Gait is normal. Tandem gait is normal. Romberg is negative. No drift is seen.  Reflexes: Deep tendon reflexes are symmetric and normal bilaterally. Toes are downgoing bilaterally.   Assessment/Plan:  1. Involuntary shoulder movement, probable motor tic disorder  2. Burning dysesthesias in the feet  The patient reports that the involuntary movements of the shoulders is most bothersome to her. The patient appears to have a motor tic disorder. The patient has had some issues throughout her life, but the symptoms have worsened recently. The patient does indicate that she has a bipolar disorder, but she also has OCD tendencies, she has strict rules in regard to the order in which things need to be done during the day. The patient was placed on clonazepam for  the motor tic issues. The patient may need to go up on the dose of the gabapentin in the future for the sensory dysesthesias. We may consider repeat evaluation with nerve conduction study and EMG in the future. The patient will follow-up in 4 months.  Jill Alexanders MD 03/23/2016 2:36 PM  [facility] [address] [place], [place] [phone]  Phone [phone] Fax [phone]

## 2016-03-23 NOTE — Patient Instructions (Addendum)
With the clonazepam 0.5 mg tablet, take one at night for 3 weeks, then take one tablet twice a day, watch out for drowsiness.  Tic Disorders Tic disorders are neuropsychiatric disorders that usually start in childhood. Tics are rapid and repetitive muscle contractions that result in purposeless body movements (motor tics) or noises (vocal tics). They are involuntary. People with tics may be able to delay them for minutes or hours but are unable to control them. Tics vary in number, severity, and frequency. They may be embarrassing, interfere with social relationships, or have a negative impact on self-esteem. Tic disorders may also interfere with sports, school, or work performance. Severe tics may cause major depression with suicidal thoughts or accidental self-injury. Tic disorders usually begin in the childhood or teenage years but may start at any age. They may last for a short time and go away completely. They may become more severe and frequent over time or come and go over a lifetime. People who have family members with tic disorders are at higher risk for developing tics. People with tics often have an additional mental health disorder, such as attention deficit hyperactivity disorder, obsessive compulsive disorder, anxiety, or depression, or they may have a learning disorder. Tics can get worse with stress and with use of certain medicines and "recreational" drugs. Typically, tics do not occur during sleep. SIGNS AND SYMPTOMS Motor tics may involve any part of the body. Motor tics are classified as simple or complex. Examples of simple motor tics include:  Eye blinking, eye squinting, or eyebrow raising.  Nose wrinkling.  Mouth twitching, grimacing (bearing teeth), or tongue movements.  Head nodding or twisting.  Shoulder shrugging.  Arm jerking.  Foot shaking. Complex motor tics look more purposeful. Examples of complex tics include:  Grooming behavior.  Smelling  objects.  Jumping.  Imitating the behavior of others.  Making rude or obscene gestures. Vocal tics involve muscles in the voice box (vocal cords), muscles of the throat and large intestine, and muscles used for breathing. Vocal tics are also classified as simple or complex. Simple vocal tics produce noises. Examples include:  Coughing.  Throat clearing.  Grunting.  Yawning.  Sniffing.  Snorting.  Barking. Complex vocal tics produce words or sentences. These may seem out of context or be repetitive. They may be rude or imitate what others say. DIAGNOSIS Tic disorders are diagnosed through an assessment by your health care provider. Your health care provider will ask about the type and frequency of your tics, when they started, and how they affect your daily activities. Your health care provider also may:  Ask about other medical issues you have or medicine or "recreational" drugs that you use.  Perform a physical examination, including a full neurological exam.  Order blood tests or brain imaging exams.  Refer you to a neurologist or mental health specialist for further evaluation. A number of other disorders cause abnormal movements that can look like tics. These include other mental disorders, a number of medical conditions, and use of certain medicines or "recreational" drugs.  If your health care provider determines that you have a tic disorder, the exact diagnosis will depend on the type and number of tics you have and when they started. If your tics started before you were 61 years old and have lasted 1 year or longer, then you will be diagnosed with either Tourette disorder or persistent (chronic) motor or vocal tic disorder. Tourette disorder is the most severe tic disorder. It causes both  multiple motor tics and one or more vocal tics. Tourette disorder tics are often complex. Chronic motor or vocal tic disorder causes single or multiple motor or vocal tics but not both. It  is more common and less severe than Tourette disorder.  If you have single or multiple motor or vocal tics or both that started before 61 years of age but have been present for less than 1 year, provisional tic disorder will be diagnosed. If your tics started after 61 years of age, other specified or unspecified tic disorder will be diagnosed. TREATMENT People with mild tics who are functioning well may not require treatment. Your health care provider can help you decide what treatment is best for you. The following options are available:  Cognitive behavioral therapy. This treatment is a form of talk therapy provided by mental health professionals. Cognitive behavioral therapy can help people with tic disorders become more aware of their tics, control the tics, or use more purposeful voluntary movements to conceal them.  Family therapy. Family therapy provides education and emotional support for family members of people with tic disorders. It can be especially helpful for the parents of children with tics to know that their child cannot control the tics and is not to blame for them.  Medicine. Certain medicines can help control tics. One medicine may be more effective than another if you have additional mental health disorders such as attention deficit hyperactivity disorder, obsessive compulsive disorder, or a depressive disorder. People with severe tic disorders may benefit from injections of botulinum toxin, which causes muscle relaxation, or electrical stimulation of the brain (deep brain stimulation). HOME CARE INSTRUCTIONS  Take all medicines as prescribed.  Check with your health care provider before using any new prescription or over-the-counter medicines.  Keep all follow-up appointments with your health care provider. SEEK MEDICAL CARE IF:   You are not able to take your medicines as prescribed.  Your symptoms get worse. SEEK IMMEDIATE MEDICAL CARE IF:  You have thoughts about  hurting yourself or others.   This information is not intended to replace advice given to you by your health care provider. Make sure you discuss any questions you have with your health care provider.   Document Released: 04/17/2013 Document Revised: 08/20/2013 Document Reviewed: 04/17/2013 Elsevier Interactive Patient Education [employer].

## 2016-05-30 ENCOUNTER — Encounter: Payer: Self-pay | Admitting: Neurology

## 2016-05-30 ENCOUNTER — Ambulatory Visit (INDEPENDENT_AMBULATORY_CARE_PROVIDER_SITE_OTHER): Payer: BC Managed Care – PPO | Admitting: Neurology

## 2016-05-30 VITALS — BP 139/80 | HR 64 | Ht 65.5 in | Wt 169.5 lb

## 2016-05-30 DIAGNOSIS — R202 Paresthesia of skin: Secondary | ICD-10-CM

## 2016-05-30 DIAGNOSIS — F958 Other tic disorders: Secondary | ICD-10-CM

## 2016-05-30 MED ORDER — GABAPENTIN 100 MG PO CAPS: ORAL_CAPSULE | ORAL | 5 refills | Status: DC

## 2016-05-30 NOTE — Progress Notes (Signed)
Reason for visit: Foot pain  Debra Buchanan is an 60 y.o. female  History of present illness:  Debra Buchanan is a 60 year old right-handed white female with a history of a motor tic disorder and bilateral foot pain. The foot pain has been chronic in nature, she has seen a podiatrist in past for this through [facility]. The patient has most of her discomfort in the heels of the foot, the pain is gone when she wakes up in the morning, but as soon as she starts putting weight on the feet, the pain returns and worsens as the day goes on. The patient does have some paresthesias at times in the evening hours. Nerve conduction studies done previously have not shown evidence of a neuropathy. The patient is on some gabapentin with some benefit, she takes 300 mg at night. The patient has a motor tic disorder that affects the shoulder area on the left, the use of clonazepam has been beneficial, but she cannot take the medication during the day as it results in drowsiness. She returns to this office for an evaluation.  Past Medical History:  Diagnosis Date  . Allergy   . Anemia   . Anxiety   . Bipolar disorder [facility])     Past Surgical History:  Procedure Laterality Date  . CARPAL TUNNEL RELEASE  2001  . [place]  . TUBAL LIGATION      Family History  Problem Relation Age of Onset  . Breast cancer Mother   . Emphysema Father   . Heart attack Paternal Grandmother   . Colon cancer Neg Hx   . Esophageal cancer Neg Hx   . Stomach cancer Neg Hx     Social history:  reports that she has never smoked. She has never used smokeless tobacco. She reports that she drinks about 1.0 oz of alcohol per week . She reports that she does not use drugs.    Allergies  Allergen Reactions  . Iodine Swelling  . Penicillins     Unsure of reaction    Medications:  Prior to Admission medications   Medication Sig Start Date End Date Taking? Authorizing Provider  clonazePAM  (KLONOPIN) 0.5 MG tablet Take 1 tablet (0.5 mg total) by mouth 2 (two) times daily. 03/23/16  Yes Kathrynn Ducking, MD  gabapentin (NEURONTIN) 100 MG capsule TAKE 1 TO 3 CAPSULES AT BEDTIME 02/26/16  Yes Historical Provider, MD  lamoTRIgine (LAMICTAL) 200 MG tablet Take 200 mg by mouth daily.     Yes Historical Provider, MD    ROS:  Out of a complete 14 system review of symptoms, the patient complains only of the following symptoms, and all other reviewed systems are negative.  Cough Walking difficulty Environmental allergies  Blood pressure 139/80, pulse 64, height 5' 5.5" (1.664 m), weight 169 lb 8 oz (76.9 kg).  Physical Exam  General: The patient is alert and cooperative at the time of the examination.  Skin: No significant peripheral edema is noted.   Neurologic Exam  Mental status: The patient is alert and oriented x 3 at the time of the examination. The patient has apparent normal recent and remote memory, with an apparently normal attention span and concentration ability.   Cranial nerves: Facial symmetry is present. Speech is normal, no aphasia or dysarthria is noted. Extraocular movements are full. Visual fields are full.  Motor: The patient has good strength in all 4 extremities.  Sensory examination: Soft touch sensation is  symmetric on the face, arms, and legs.  Coordination: The patient has good finger-nose-finger and heel-to-shin bilaterally.  Gait and station: The patient has a normal gait. Tandem gait is normal. Romberg is negative. No drift is seen.  Reflexes: Deep tendon reflexes are symmetric. Ankle jerk reflexes are well-maintained bilaterally.   Assessment/Plan:  1. Foot pain and paresthesias, mechanical source pain versus neuropathy  2. Motor tic disorder  The motor tic disorder has responded some to the use of clonazepam. The patient is reporting bilateral heel pain that appears to be consistent with a mechanical pain source. The patient has pain  mainly with weightbearing, the pain is alleviated by getting off of her feet. The patient will go on a leave taking 2 tablets twice daily for the next 6-8 weeks. The patient will restart gabapentin taking 100 mg twice during the day and 3 at night. A prescription was called in for this. She will follow-up in 6 months.  Jill Alexanders MD 05/30/2016 8:06 AM  [facility] [address] [place], [place] [address]  Phone [phone] Fax [phone]

## 2016-07-18 ENCOUNTER — Telehealth: Payer: Self-pay | Admitting: Neurology

## 2016-07-18 NOTE — Telephone Encounter (Signed)
Returned pt TC. Unable to leave mssg d/t VM box full. Pt may continue 2 tabs of Aleve (220 mg each) twice per day for another 2 weeks if OTC med has been helpful for discomfort/inflammation.

## 2016-07-18 NOTE — Telephone Encounter (Signed)
Patient called to advise, when she was here in early October, she was advised take Aleve 2 tablets twice a day for 6 weeks, has question whether to continue this medication, also not sure that she is taking the right strength of this medication. Please call to advise.

## 2016-07-20 ENCOUNTER — Ambulatory Visit: Payer: BC Managed Care – PPO | Admitting: Neurology

## 2016-07-20 NOTE — Telephone Encounter (Signed)
Returned pt TC. Says that she has restarted gabapentin as instructed. Feels that Aleve has been helpful and plans on continuing it twice a day for another 1-2 wks. Asks if she should increase dose of gabapentin once she stops the Aleve.

## 2016-07-20 NOTE — Telephone Encounter (Signed)
Patient is returning a call. °

## 2016-07-20 NOTE — Telephone Encounter (Signed)
I called patient. The patient is on gabapentin in low dose taking 100 mg twice during the day and 300 at night. She claims that she is tolerating this well. She is to go to 200 mg twice during the day and 300 mg at night, if she does well with this, I will call in a 300 mg capsule taking 3 times daily.

## 2016-08-03 MED ORDER — GABAPENTIN 300 MG PO CAPS: 300.0000 mg | ORAL_CAPSULE | Freq: Three times a day (TID) | ORAL | 11 refills | Status: DC

## 2016-08-03 NOTE — Telephone Encounter (Signed)
Returned call and spoke w/ pt. Reports that she's done well on increasing dose (200 mg 2x/day and 300 mg @ bedtime). New rx sent in per Dr. Jannifer Franklin instructions for 300 mg 3x/day.

## 2016-08-03 NOTE — Telephone Encounter (Signed)
Patient is calling because she says she never received a call regarding increasing dosage of Gabapentin after stopping Aleve.

## 2016-08-03 NOTE — Addendum Note (Signed)
Addended by: Monte Fantasia on: 08/03/2016 05:04 PM   Modules accepted: Orders

## 2016-08-09 ENCOUNTER — Telehealth: Payer: Self-pay | Admitting: *Deleted

## 2016-08-09 NOTE — Telephone Encounter (Signed)
Patient called stating she is having a lot of pain and burning in her feet. She stated she has to walk a lot on her job and can barely walk.  She stated her PCP has given her injections in the past, and she gets relief for 2-3 weeks. She stated gabapentin is not helping. She would like to have Dr Jannifer Franklin' approval of injections or for him to let her know what else she can do for her pain. She stated she is almost to the point of having to work only a half day.  Please call patient with Dr Jannifer Franklin' reply as soon as possible at her request.

## 2016-08-09 NOTE — Telephone Encounter (Signed)
I called patient. She is not getting any benefit with the gabapentin. She does have some burning of feet, but most of her pain is involving the heels associated with weightbearing, she feels better off of her feet.  I suspect that the pain is a mechanical source, she will be seeing her podiatrist tomorrow and I have encouraged her to keep that appointment.

## 2016-08-23 MED FILL — HYDROCODON-APAP 5-325: 5-325 | 4 days supply | Qty: 24 | Fill #0

## 2016-10-28 ENCOUNTER — Other Ambulatory Visit: Payer: Self-pay | Admitting: Neurology

## 2016-10-28 ENCOUNTER — Telehealth: Payer: Self-pay | Admitting: Neurology

## 2016-10-28 MED ORDER — CLONAZEPAM 0.5 MG PO TABS: 0.5000 mg | ORAL_TABLET | Freq: Two times a day (BID) | ORAL | 3 refills | Status: DC

## 2016-10-28 NOTE — Telephone Encounter (Signed)
Patient was taking clonazepam as needed for sleep, last refill was in June 2017, require refill, faxed prescription to her pharmacy

## 2016-10-31 ENCOUNTER — Other Ambulatory Visit: Payer: Self-pay | Admitting: *Deleted

## 2016-10-31 MED ORDER — CLONAZEPAM 0.5 MG PO TABS: 0.5000 mg | ORAL_TABLET | Freq: Two times a day (BID) | ORAL | 3 refills | Status: DC

## 2016-10-31 NOTE — Telephone Encounter (Signed)
Faxed printed/signed rx klonopin to pt pharmacy. [phone]. Received confirmation.

## 2016-10-31 NOTE — Telephone Encounter (Signed)
Re-printed rx, awaiting CW,MD signature.

## 2016-10-31 NOTE — Telephone Encounter (Signed)
Patient called office in reference to clonazePAM (KLONOPIN) 0.5 MG tablet.  Per patient it was approved on Friday, but the pharmacy never received the prescription.  Please call

## 2016-10-31 NOTE — Telephone Encounter (Signed)
Faxed to pt pharmacy. Received confirmation.

## 2016-11-21 ENCOUNTER — Other Ambulatory Visit (HOSPITAL_COMMUNITY): Payer: Self-pay | Admitting: Internal Medicine

## 2016-11-21 ENCOUNTER — Ambulatory Visit (HOSPITAL_COMMUNITY)
Admission: RE | Admit: 2016-11-21 | Discharge: 2016-11-21 | Disposition: A | Discharge status: Home / Self Care | Payer: BC Managed Care – PPO | Source: Ambulatory Visit | Attending: Internal Medicine | Admitting: Internal Medicine

## 2016-11-21 ENCOUNTER — Other Ambulatory Visit: Payer: Self-pay | Admitting: Internal Medicine

## 2016-11-21 DIAGNOSIS — R05 Cough: Secondary | ICD-10-CM

## 2016-11-21 DIAGNOSIS — R059 Cough, unspecified: Secondary | ICD-10-CM

## 2016-11-21 DIAGNOSIS — R937 Abnormal findings on diagnostic imaging of other parts of musculoskeletal system: Secondary | ICD-10-CM | POA: Insufficient documentation

## 2016-11-21 DIAGNOSIS — M25531 Pain in right wrist: Secondary | ICD-10-CM | POA: Diagnosis present

## 2016-11-21 DIAGNOSIS — M19031 Primary osteoarthritis, right wrist: Secondary | ICD-10-CM | POA: Insufficient documentation

## 2016-11-24 ENCOUNTER — Encounter: Payer: Self-pay | Admitting: Orthopaedic Surgery

## 2016-11-24 ENCOUNTER — Ambulatory Visit (INDEPENDENT_AMBULATORY_CARE_PROVIDER_SITE_OTHER): Payer: BC Managed Care – PPO | Admitting: Orthopaedic Surgery

## 2016-11-24 VITALS — BP 133/80 | HR 84 | Temp 97.9°F | Ht 66.0 in | Wt 166.0 lb

## 2016-11-24 DIAGNOSIS — M778 Other enthesopathies, not elsewhere classified: Secondary | ICD-10-CM

## 2016-11-24 DIAGNOSIS — M779 Enthesopathy, unspecified: Principal | ICD-10-CM

## 2016-11-24 NOTE — Progress Notes (Signed)
Subjective:    Patient ID: Debra Buchanan, female    DOB: August 31, 1955, 61 y.o.   MRN: [id number]  HPI She has had pain of the right wrist and hand and thumb for several weeks.  She had surgery for plantar fascitis on the right and used a crutch for a while.  She started having pain in the right wrist area.  She saw Dr. Merlyn Albert.  X-rays were negative except for slight minimal area of sclerosis of the navicular bone.  I have reviewed the x-rays and I am not concerned about this variant.    She has pain with lifting objects and using her right thumb. She has a brace that makes her feel worse.  She has no swelling, no numbness and no redness.  She has no trauma.  Nothing seems to help it.   Review of Systems  HENT: Negative for congestion.   Respiratory: Negative for cough and shortness of breath.   Cardiovascular: Negative for chest pain and leg swelling.  Endocrine: Positive for cold intolerance.  Musculoskeletal: Positive for arthralgias.  Allergic/Immunologic: Positive for environmental allergies.  Psychiatric/Behavioral: The patient is nervous/anxious.    Past Medical History:  Diagnosis Date  . Allergy   . Anemia   . Anxiety   . Bipolar disorder [facility])     Past Surgical History:  Procedure Laterality Date  . CARPAL TUNNEL RELEASE  2001  . [place]  . TUBAL LIGATION      Current Outpatient Prescriptions on File Prior to Visit  Medication Sig Dispense Refill  . clonazePAM (KLONOPIN) 0.5 MG tablet Take 1 tablet (0.5 mg total) by mouth 2 (two) times daily. 60 tablet 3  . gabapentin (NEURONTIN) 300 MG capsule Take 1 capsule (300 mg total) by mouth 3 (three) times daily. 90 capsule 11  . lamoTRIgine (LAMICTAL) 200 MG tablet Take 200 mg by mouth daily.       No current facility-administered medications on file prior to visit.     Social History   Social History  . Marital status: Divorced    Spouse name: N/A  . Number of children: 2  . Years of  education: N/A   Occupational History  . School Counselor [employer]   Social History Main Topics  . Smoking status: Never Smoker  . Smokeless tobacco: Never Used  . Alcohol use 1.0 oz/week    2 drink(s) per week     Comment: occasional  . Drug use: No  . Sexual activity: Not on file   Other Topics Concern  . Not on file   Social History Narrative   Lives at home alone   Right-handed   Rare caffeine use    Family History  Problem Relation Age of Onset  . Breast cancer Mother   . Emphysema Father   . Heart attack Paternal Grandmother   . Colon cancer Neg Hx   . Esophageal cancer Neg Hx   . Stomach cancer Neg Hx     BP 133/80   Pulse 84   Temp 97.9 F (36.6 C)   Ht 5\' 6"  (1.676 m)   Wt 166 lb (75.3 kg)   BMI 26.79 kg/m       Objective:   Physical Exam  Constitutional: She is oriented to person, place, and time. She appears well-developed and well-nourished.  HENT:  Head: Normocephalic and atraumatic.  Eyes: Conjunctivae and EOM are normal. Pupils are equal, round, and reactive to  light.  Neck: Normal range of motion. Neck supple.  Cardiovascular: Normal rate, regular rhythm and intact distal pulses.   Pulmonary/Chest: Effort normal.  Abdominal: Soft.  Musculoskeletal: She exhibits tenderness (Right dominant thumb with pain over the first extensor compartment, positive Finklestein sign, ROM full, left wrist negative.).  Neurological: She is alert and oriented to person, place, and time. She displays normal reflexes. No cranial nerve deficit. She exhibits normal muscle tone. Coordination normal.  Skin: Skin is warm and dry.  Psychiatric: She has a normal mood and affect. Her behavior is normal. Judgment and thought content normal.  Vitals reviewed.         Assessment & Plan:   Encounter Diagnosis  Name Primary?  . Tendinitis of thumb Yes   Procedure note:  After permission from the patient and sterile prep, the first extensor  compartment of the right wrist was injected with 1 % Xylocaine and 1 cc of DepoMedrol 40 by sterile technique tolerated well.  She was given a thumb splint.  She can use Aspercreme over the area of tenderness as well.  I will see her in two weeks.    Precautions discussed.  Call if any problem.

## 2016-11-30 ENCOUNTER — Ambulatory Visit: Payer: BC Managed Care – PPO | Admitting: Neurology

## 2016-12-06 ENCOUNTER — Ambulatory Visit: Payer: BC Managed Care – PPO | Admitting: Orthopedic Surgery

## 2016-12-13 ENCOUNTER — Ambulatory Visit (INDEPENDENT_AMBULATORY_CARE_PROVIDER_SITE_OTHER): Payer: BC Managed Care – PPO | Admitting: Orthopaedic Surgery

## 2016-12-13 ENCOUNTER — Encounter: Payer: Self-pay | Admitting: Orthopaedic Surgery

## 2016-12-13 VITALS — BP 144/85 | HR 62 | Ht 66.0 in | Wt 170.0 lb

## 2016-12-13 DIAGNOSIS — M778 Other enthesopathies, not elsewhere classified: Secondary | ICD-10-CM

## 2016-12-13 DIAGNOSIS — M65311 Trigger thumb, right thumb: Secondary | ICD-10-CM | POA: Diagnosis not present

## 2016-12-13 DIAGNOSIS — M779 Enthesopathy, unspecified: Secondary | ICD-10-CM

## 2016-12-13 NOTE — Progress Notes (Signed)
Patient XI:PJASN Debra Buchanan, female DOB:July 11, 1956, 61 y.o. KNL:[id number]  Chief Complaint  Patient presents with  . Follow-up    right thumb    HPI  Debra Buchanan is a 61 y.o. female who had injection last time for DeQuervain's on the right.  She did well.  Now she has developed a trigger thumb on the right with locking.  She has no redness or swelling.  She has no extensor pain now. She has been using her splint. HPI  Body mass index is 27.44 kg/m.  ROS  Review of Systems  HENT: Negative for congestion.   Respiratory: Negative for cough and shortness of breath.   Cardiovascular: Negative for chest pain and leg swelling.  Endocrine: Positive for cold intolerance.  Musculoskeletal: Positive for arthralgias.  Allergic/Immunologic: Positive for environmental allergies.  Psychiatric/Behavioral: The patient is nervous/anxious.     Past Medical History:  Diagnosis Date  . Allergy   . Anemia   . Anxiety   . Bipolar disorder [facility])     Past Surgical History:  Procedure Laterality Date  . CARPAL TUNNEL RELEASE  2001  . [place]  . TUBAL LIGATION      Family History  Problem Relation Age of Onset  . Breast cancer Mother   . Emphysema Father   . Heart attack Paternal Grandmother   . Colon cancer Neg Hx   . Esophageal cancer Neg Hx   . Stomach cancer Neg Hx     Social History Social History  Substance Use Topics  . Smoking status: Never Smoker  . Smokeless tobacco: Never Used  . Alcohol use 1.0 oz/week    2 drink(s) per week     Comment: occasional    Allergies  Allergen Reactions  . Iodine Swelling  . Penicillins     Unsure of reaction    Current Outpatient Prescriptions  Medication Sig Dispense Refill  . clonazePAM (KLONOPIN) 0.5 MG tablet Take 1 tablet (0.5 mg total) by mouth 2 (two) times daily. 60 tablet 3  . gabapentin (NEURONTIN) 300 MG capsule Take 1 capsule (300 mg total) by mouth 3 (three) times daily. 90 capsule 11  .  lamoTRIgine (LAMICTAL) 200 MG tablet Take 200 mg by mouth daily.       No current facility-administered medications for this visit.      Physical Exam  Blood pressure (!) 144/85, pulse 62, height 5\' 6"  (1.676 m), weight 170 lb (77.1 kg).  Constitutional: overall normal hygiene, normal nutrition, well developed, normal grooming, normal body habitus. Assistive device:right thumb splint  Musculoskeletal: gait and station Limp none, muscle tone and strength are normal, no tremors or atrophy is present.  .  Neurological: coordination overall normal.  Deep tendon reflex/nerve stretch intact.  Sensation normal.  Cranial nerves II-XII intact.   Skin:   Normal overall no scars, lesions, ulcers or rashes. No psoriasis.  Psychiatric: Alert and oriented x 3.  Recent memory intact, remote memory unclear.  Normal mood and affect. Well groomed.  Good eye contact.  Cardiovascular: overall no swelling, no varicosities, no edema bilaterally, normal temperatures of the legs and arms, no clubbing, cyanosis and good capillary refill.  Lymphatic: palpation is normal.  The extensor side of the right thumb is not painful at all.  She has no pain of the first extensor compartment like before.  She has locking of the thumb at the A1 pulley today.  NV is intact.  She has no redness or swelling.  The  patient has been educated about the nature of the problem(s) and counseled on treatment options.  The patient appeared to understand what I have discussed and is in agreement with it.  Encounter Diagnoses  Name Primary?  . Trigger thumb, right thumb Yes  . Tendinitis of thumb    Procedure:  After permission from the patient, the A1pulley area of her right thumb was sterile prepped.  An injection with 1% Xylocaine and 1 cc of DepoMedrol was given in the A1 pulley area of the right thumb by sterile technique tolerated well.   PLAN Call if any problems.  Precautions discussed.  Continue current medications.    Return to clinic 1 month   Electronically Signed Sanjuana Kava, MD 4/17/20184:13 PM

## 2017-01-11 ENCOUNTER — Ambulatory Visit: Payer: BC Managed Care – PPO | Admitting: Orthopaedic Surgery

## 2017-03-20 ENCOUNTER — Ambulatory Visit (HOSPITAL_COMMUNITY)
Admission: RE | Admit: 2017-03-20 | Discharge: 2017-03-20 | Disposition: A | Discharge status: Home / Self Care | Payer: BC Managed Care – PPO | Source: Ambulatory Visit | Attending: Internal Medicine | Admitting: Internal Medicine

## 2017-03-20 ENCOUNTER — Other Ambulatory Visit (HOSPITAL_COMMUNITY): Payer: Self-pay | Admitting: Internal Medicine

## 2017-03-20 DIAGNOSIS — M542 Cervicalgia: Secondary | ICD-10-CM

## 2017-03-20 DIAGNOSIS — M5412 Radiculopathy, cervical region: Secondary | ICD-10-CM | POA: Insufficient documentation

## 2017-03-20 DIAGNOSIS — M79603 Pain in arm, unspecified: Secondary | ICD-10-CM

## 2017-05-31 ENCOUNTER — Other Ambulatory Visit: Payer: Self-pay | Admitting: Neurology

## 2017-05-31 NOTE — Telephone Encounter (Signed)
Faxed printed/signed rx clonazepam to [facility] at [phone]. Received fax confirmation.

## 2017-06-06 ENCOUNTER — Encounter: Payer: Self-pay | Admitting: Orthopaedic Surgery

## 2017-06-06 ENCOUNTER — Ambulatory Visit (INDEPENDENT_AMBULATORY_CARE_PROVIDER_SITE_OTHER): Payer: BC Managed Care – PPO | Admitting: Orthopaedic Surgery

## 2017-06-06 VITALS — BP 133/80 | HR 66 | Temp 97.1°F | Ht 66.0 in | Wt 184.0 lb

## 2017-06-06 DIAGNOSIS — M778 Other enthesopathies, not elsewhere classified: Secondary | ICD-10-CM

## 2017-06-06 DIAGNOSIS — M779 Enthesopathy, unspecified: Principal | ICD-10-CM

## 2017-06-06 NOTE — Progress Notes (Signed)
CC:  My thumb hurts again  She has recurrent DeQuervain's on the right.  It is very tender and bothering her a lot.  She had good response with injection several months ago.  She has positive Wynn [place] of the first compartment on the right.  NV intact. ROM is full.  Encounter Diagnosis  Name Primary?  . Tendinitis of thumb Yes   Procedure note  After permission from the patient the first extensor compartment of the right wrist was prepped and injected with 1% Xylocaine and 1 cc of DepoMedrol 40 by sterile technique tolerated well.  Return in two weeks.  Call if any problem.  Precautions discussed.    Electronically Signed Sanjuana Kava, MD 10/9/20182:22 PM

## 2017-06-19 ENCOUNTER — Ambulatory Visit: Payer: BC Managed Care – PPO | Admitting: Neurology

## 2017-06-20 ENCOUNTER — Ambulatory Visit (INDEPENDENT_AMBULATORY_CARE_PROVIDER_SITE_OTHER): Payer: BC Managed Care – PPO | Admitting: Licensed Clinical Social Worker

## 2017-06-20 DIAGNOSIS — F3173 Bipolar disorder, in partial remission, most recent episode manic: Secondary | ICD-10-CM

## 2017-06-27 ENCOUNTER — Ambulatory Visit: Payer: BC Managed Care – PPO | Admitting: Orthopaedic Surgery

## 2017-06-28 ENCOUNTER — Ambulatory Visit (INDEPENDENT_AMBULATORY_CARE_PROVIDER_SITE_OTHER): Payer: BC Managed Care – PPO | Admitting: Orthopaedic Surgery

## 2017-06-28 ENCOUNTER — Encounter: Payer: Self-pay | Admitting: Orthopaedic Surgery

## 2017-06-28 VITALS — BP 133/80 | HR 71 | Temp 97.6°F | Ht 66.0 in | Wt 186.0 lb

## 2017-06-28 DIAGNOSIS — M779 Enthesopathy, unspecified: Principal | ICD-10-CM

## 2017-06-28 DIAGNOSIS — M778 Other enthesopathies, not elsewhere classified: Secondary | ICD-10-CM

## 2017-06-28 NOTE — Progress Notes (Signed)
Patient Debra Buchanan, female DOB:[birth year], 61 y.o. OEV:[id number]  Chief Complaint  Patient presents with  . Follow-up    right dequervains    HPI  Debra Buchanan is a 61 y.o. female who has right DeQuervain's.  She is much improved after the injection.  She has no swelling.  She is using her thumb well.  She has a small ganglion over the distal radial styloid area.  It is not red.  HPI  Body mass index is 30.02 kg/m.  ROS  Review of Systems  HENT: Negative for congestion.   Respiratory: Negative for cough and shortness of breath.   Cardiovascular: Negative for chest pain and leg swelling.  Endocrine: Positive for cold intolerance.  Musculoskeletal: Positive for arthralgias.  Allergic/Immunologic: Positive for environmental allergies.  Psychiatric/Behavioral: The patient is nervous/anxious.     Past Medical History:  Diagnosis Date  . Allergy   . Anemia   . Anxiety   . Bipolar disorder [facility])     Past Surgical History:  Procedure Laterality Date  . CARPAL TUNNEL RELEASE  2001  . [place]  . TUBAL LIGATION      Family History  Problem Relation Age of Onset  . Breast cancer Mother   . Emphysema Father   . Heart attack Paternal Grandmother   . Colon cancer Neg Hx   . Esophageal cancer Neg Hx   . Stomach cancer Neg Hx     Social History Social History  Substance Use Topics  . Smoking status: Never Smoker  . Smokeless tobacco: Never Used  . Alcohol use 1.0 oz/week    2 drink(s) per week     Comment: occasional    Allergies  Allergen Reactions  . Iodine Swelling  . Penicillins     Unsure of reaction    Current Outpatient Prescriptions  Medication Sig Dispense Refill  . clonazePAM (KLONOPIN) 0.5 MG tablet TAKE ONE TABLET TWICE DAILY 60 tablet 1  . gabapentin (NEURONTIN) 300 MG capsule Take 1 capsule (300 mg total) by mouth 3 (three) times daily. 90 capsule 11  . lamoTRIgine (LAMICTAL) 200 MG tablet Take 200 mg by mouth  daily.       No current facility-administered medications for this visit.      Physical Exam  Blood pressure 133/80, pulse 71, temperature 97.6 F (36.4 C), height 5\' 6"  (8.299 m), weight 186 lb (84.4 kg).  Constitutional: overall normal hygiene, normal nutrition, well developed, normal grooming, normal body habitus. Assistive device:none  Musculoskeletal: gait and station Limp none, muscle tone and strength are normal, no tremors or atrophy is present.  .  Neurological: coordination overall normal.  Deep tendon reflex/nerve stretch intact.  Sensation normal.  Cranial nerves II-XII intact.   Skin:   Normal overall no scars, lesions, ulcers or rashes. No psoriasis.  Psychiatric: Alert and oriented x 3.  Recent memory intact, remote memory unclear.  Normal mood and affect. Well groomed.  Good eye contact.  Cardiovascular: overall no swelling, no varicosities, no edema bilaterally, normal temperatures of the legs and arms, no clubbing, cyanosis and good capillary refill.  Lymphatic: palpation is normal.  All other systems reviewed and are negative   Right thumb has full motion, no pain. First extensor compartment normal except for small ganglion cyst size of a grape seed over the radial styloid area dorsally.  NV intact. Grips are normal.  The patient has been educated about the nature of the problem(s) and counseled on treatment options.  The patient appeared to understand what I have discussed and is in agreement with it.  Encounter Diagnosis  Name Primary?  . Tendinitis of thumb Yes    PLAN Call if any problems.  Precautions discussed.  Continue current medications.   Return to clinic 1 month   Electronically Signed Sanjuana Kava, MD 10/31/20183:27 PM

## 2017-07-04 ENCOUNTER — Ambulatory Visit (INDEPENDENT_AMBULATORY_CARE_PROVIDER_SITE_OTHER): Payer: BC Managed Care – PPO | Admitting: Licensed Clinical Social Worker

## 2017-07-04 DIAGNOSIS — F315 Bipolar disorder, current episode depressed, severe, with psychotic features: Secondary | ICD-10-CM | POA: Diagnosis not present

## 2017-07-17 ENCOUNTER — Ambulatory Visit (INDEPENDENT_AMBULATORY_CARE_PROVIDER_SITE_OTHER): Payer: BC Managed Care – PPO | Admitting: Licensed Clinical Social Worker

## 2017-07-17 DIAGNOSIS — F315 Bipolar disorder, current episode depressed, severe, with psychotic features: Secondary | ICD-10-CM | POA: Diagnosis not present

## 2017-07-26 ENCOUNTER — Ambulatory Visit: Payer: BC Managed Care – PPO | Admitting: Orthopaedic Surgery

## 2017-07-26 ENCOUNTER — Encounter: Payer: Self-pay | Admitting: Orthopaedic Surgery

## 2017-07-26 VITALS — BP 141/78 | HR 61 | Temp 96.7°F | Ht 66.0 in | Wt 182.0 lb

## 2017-07-26 DIAGNOSIS — M7701 Medial epicondylitis, right elbow: Secondary | ICD-10-CM | POA: Diagnosis not present

## 2017-07-26 DIAGNOSIS — M778 Other enthesopathies, not elsewhere classified: Secondary | ICD-10-CM

## 2017-07-26 DIAGNOSIS — M779 Enthesopathy, unspecified: Principal | ICD-10-CM

## 2017-07-26 NOTE — Progress Notes (Signed)
Patient VZ:DGLOV Debra Buchanan, female DOB:February 18, 1956, 61 y.o. FIE:[id number]  Chief Complaint  Patient presents with  . Follow-up    Right Dequervains    HPI  Debra Buchanan is a 61 y.o. female who has had DeQuervain's on the right.  She is much improved and has no pain now.  She has a new problem:  She has pain of the medial epicondyle.  She was helping her son move and lifted a lot of boxes and other items.  It is a little better but bothers her more first thing in the morning.  She has no numbness, no redness, no swelling.  She is on diclofenac. HPI  Body mass index is 29.38 kg/m.  ROS  Review of Systems  HENT: Negative for congestion.   Respiratory: Negative for cough and shortness of breath.   Cardiovascular: Negative for chest pain and leg swelling.  Endocrine: Positive for cold intolerance.  Musculoskeletal: Positive for arthralgias.  Allergic/Immunologic: Positive for environmental allergies.  Psychiatric/Behavioral: The patient is nervous/anxious.   All other systems reviewed and are negative.   Past Medical History:  Diagnosis Date  . Allergy   . Anemia   . Anxiety   . Bipolar disorder [facility])     Past Surgical History:  Procedure Laterality Date  . CARPAL TUNNEL RELEASE  2001  . [place]  . TUBAL LIGATION      Family History  Problem Relation Age of Onset  . Breast cancer Mother   . Emphysema Father   . Heart attack Paternal Grandmother   . Colon cancer Neg Hx   . Esophageal cancer Neg Hx   . Stomach cancer Neg Hx     Social History Social History   Tobacco Use  . Smoking status: Never Smoker  . Smokeless tobacco: Never Used  Substance Use Topics  . Alcohol use: Yes    Alcohol/week: 1.0 oz    Types: 2 drink(s) per week    Comment: occasional  . Drug use: No    Allergies  Allergen Reactions  . Iodine Swelling  . Penicillins     Unsure of reaction    Current Outpatient Medications  Medication Sig Dispense Refill   . clonazePAM (KLONOPIN) 0.5 MG tablet TAKE ONE TABLET TWICE DAILY 60 tablet 1  . gabapentin (NEURONTIN) 300 MG capsule Take 1 capsule (300 mg total) by mouth 3 (three) times daily. 90 capsule 11  . lamoTRIgine (LAMICTAL) 200 MG tablet Take 200 mg by mouth daily.       No current facility-administered medications for this visit.      Physical Exam  Blood pressure (!) 141/78, pulse 61, temperature (!) 96.7 F (35.9 C), height 5\' 6"  (1.676 m), weight 182 lb (82.6 kg).  Constitutional: overall normal hygiene, normal nutrition, well developed, normal grooming, normal body habitus. Assistive device:none  Musculoskeletal: gait and station Limp none, muscle tone and strength are normal, no tremors or atrophy is present.  .  Neurological: coordination overall normal.  Deep tendon reflex/nerve stretch intact.  Sensation normal.  Cranial nerves II-XII intact.   Skin:   Normal overall no scars, lesions, ulcers or rashes. No psoriasis.  Psychiatric: Alert and oriented x 3.  Recent memory intact, remote memory unclear.  Normal mood and affect. Well groomed.  Good eye contact.  Cardiovascular: overall no swelling, no varicosities, no edema bilaterally, normal temperatures of the legs and arms, no clubbing, cyanosis and good capillary refill.  Lymphatic: palpation is normal.  All other  systems reviewed and are negative   Right thumb has full ROM and no pain.  Right medial epicondyle of the elbow has tenderness.  She has increased pain with resisted flexion of the wrist.  She has no ulnar nerve problem.  ROM is full.  The patient has been educated about the nature of the problem(s) and counseled on treatment options.  The patient appeared to understand what I have discussed and is in agreement with it.  Encounter Diagnoses  Name Primary?  . Tendinitis of thumb Yes  . Medial epicondylitis of right elbow     PLAN Call if any problems.  Precautions discussed.  Continue current medications. I  have told her about using Aspercreme to the elbow area as well.  Return to clinic prn   Electronically Mason, MD 11/28/20183:46 PM

## 2017-07-31 ENCOUNTER — Ambulatory Visit (INDEPENDENT_AMBULATORY_CARE_PROVIDER_SITE_OTHER): Payer: BC Managed Care – PPO | Admitting: Licensed Clinical Social Worker

## 2017-07-31 DIAGNOSIS — F315 Bipolar disorder, current episode depressed, severe, with psychotic features: Secondary | ICD-10-CM | POA: Diagnosis not present

## 2017-08-14 ENCOUNTER — Ambulatory Visit: Payer: BC Managed Care – PPO | Admitting: Licensed Clinical Social Worker

## 2017-08-28 ENCOUNTER — Other Ambulatory Visit: Payer: Self-pay | Admitting: Neurology

## 2017-09-04 ENCOUNTER — Telehealth: Payer: Self-pay | Admitting: Neurology

## 2017-09-04 ENCOUNTER — Ambulatory Visit: Payer: BC Managed Care – PPO | Admitting: Licensed Clinical Social Worker

## 2017-09-04 MED ORDER — CLONAZEPAM 0.5 MG PO TABS
0.5000 mg | ORAL_TABLET | Freq: Two times a day (BID) | ORAL | 3 refills | Status: DC
Start: 2017-09-04 — End: 2020-01-13

## 2017-09-04 NOTE — Addendum Note (Signed)
Addended by: Kathrynn Ducking on: 09/04/2017 05:04 PM   Modules accepted: Orders

## 2017-09-04 NOTE — Telephone Encounter (Signed)
Pt has called for a refill prescription on her clonazePAM (KLONOPIN) 0.5 MG tablet [place] [place] [phone] (Phone) [phone] (Fax)   She has confirmed no changes to her insurance.  Pt has scheduled a med check but due to her work schedule she could not accept anything before June due to appointment slots available between now and June(when school lets out).  Pt is asking for a refill and a call if medication can not be filled until next appointment, she is on wait list

## 2017-09-04 NOTE — Telephone Encounter (Signed)
The patient has an appointment in June 2019.  I will refill the clonazepam.

## 2017-09-05 ENCOUNTER — Other Ambulatory Visit: Payer: Self-pay | Admitting: Neurology

## 2017-09-11 ENCOUNTER — Ambulatory Visit (INDEPENDENT_AMBULATORY_CARE_PROVIDER_SITE_OTHER): Payer: Self-pay | Admitting: Otolaryngology

## 2017-09-18 ENCOUNTER — Ambulatory Visit (INDEPENDENT_AMBULATORY_CARE_PROVIDER_SITE_OTHER): Payer: BC Managed Care – PPO | Admitting: Licensed Clinical Social Worker

## 2017-09-18 DIAGNOSIS — F315 Bipolar disorder, current episode depressed, severe, with psychotic features: Secondary | ICD-10-CM

## 2017-09-26 ENCOUNTER — Other Ambulatory Visit: Payer: Self-pay | Admitting: Orthopaedic Surgery

## 2017-10-26 ENCOUNTER — Ambulatory Visit: Payer: BC Managed Care – PPO | Admitting: Licensed Clinical Social Worker

## 2018-02-07 ENCOUNTER — Ambulatory Visit: Payer: BC Managed Care – PPO | Admitting: Neurology

## 2018-02-07 ENCOUNTER — Encounter

## 2018-03-31 ENCOUNTER — Encounter (HOSPITAL_COMMUNITY): Payer: Self-pay | Admitting: Emergency Medicine

## 2018-03-31 ENCOUNTER — Ambulatory Visit (HOSPITAL_COMMUNITY)
Admission: EM | Admit: 2018-03-31 | Discharge: 2018-03-31 | Disposition: A | Discharge status: Home / Self Care | Payer: BC Managed Care – PPO | Attending: Family Medicine | Admitting: Family Medicine

## 2018-03-31 DIAGNOSIS — M79604 Pain in right leg: Secondary | ICD-10-CM

## 2018-03-31 MED ORDER — KETOROLAC TROMETHAMINE 30 MG/ML IJ SOLN
INTRAMUSCULAR | Status: AC
  Filled 2018-03-31: qty 1

## 2018-03-31 MED ORDER — MELOXICAM 7.5 MG PO TABS: 7.5000 mg | ORAL_TABLET | Freq: Every day | ORAL | 0 refills | Status: DC

## 2018-03-31 MED ORDER — KETOROLAC TROMETHAMINE 30 MG/ML IJ SOLN
30.0000 mg | Freq: Once | INTRAMUSCULAR | Status: AC
  Administered 2018-03-31: 30 mg via INTRAMUSCULAR

## 2018-03-31 NOTE — ED Triage Notes (Signed)
Pt states she was bending over to lift up an oven and felt something pop right under her R buttocks, pt c/o pain with extending her knee, R hip pain. Pt able to walk, but painful walking.

## 2018-03-31 NOTE — ED Provider Notes (Signed)
[facility]    CSN: [id number] Arrival date & time: 03/31/18  1624     History   Chief Complaint Chief Complaint  Patient presents with  . Leg Pain    HPI Debra Buchanan is a 62 y.o. female.   62 year old female comes in for acute onset of right buttock pain. States she was bending over to lift up an oven, and while doing so, felt a pop to the right buttock and has been having pain. Right buttock and posterior leg pain that is constant, worse with movement or weight bearing. Patient with painful limping, but does bear weight. Had some numbness/tingling to the toes that has since resolved. Denies swelling, erythema, warmth. Denies back pain, saddle anesthesia, loss of bladder or bowel control. Has not taken anything for the symptoms.      Past Medical History:  Diagnosis Date  . Allergy   . Anemia   . Anxiety   . Bipolar disorder [facility])     Patient Active Problem List   Diagnosis Date Noted  . Abnormal involuntary movement 03/23/2016  . Paresthesia 03/23/2016  . Motor tic disorder 03/23/2016  . Esophageal reflux 06/21/2011  . Special screening for malignant neoplasms, colon 06/21/2011    Past Surgical History:  Procedure Laterality Date  . CARPAL TUNNEL RELEASE  2001  . [place]  . TUBAL LIGATION      OB History   None      Home Medications    Prior to Admission medications   Medication Sig Start Date End Date Taking? Authorizing Provider  QUEtiapine Fumarate (SEROQUEL PO) Take by mouth.   Yes [provider]  clonazePAM (KLONOPIN) 0.5 MG tablet Take 1 tablet (0.5 mg total) by mouth 2 (two) times daily. Patient not taking: Reported on 03/31/2018 09/04/17   Kathrynn Ducking, MD  diclofenac (VOLTAREN) 75 MG EC tablet Take 1 tablet (75 mg total) by mouth 2 (two) times daily with a meal. Patient not taking: Reported on 03/31/2018 09/26/17   Sanjuana Kava, MD  gabapentin (NEURONTIN) 300 MG capsule Take 1 capsule (300 mg  total) by mouth 3 (three) times daily. Patient not taking: Reported on 03/31/2018 08/03/16   Kathrynn Ducking, MD  lamoTRIgine (LAMICTAL) 200 MG tablet Take 200 mg by mouth daily.      [provider]  meloxicam (MOBIC) 7.5 MG tablet Take 1 tablet (7.5 mg total) by mouth daily. 03/31/18   Ok Edwards, PA-C    Family History Family History  Problem Relation Age of Onset  . Breast cancer Mother   . Emphysema Father   . Heart attack Paternal Grandmother   . Colon cancer Neg Hx   . Esophageal cancer Neg Hx   . Stomach cancer Neg Hx     Social History Social History   Tobacco Use  . Smoking status: Never Smoker  . Smokeless tobacco: Never Used  Substance Use Topics  . Alcohol use: Yes    Alcohol/week: 1.2 oz    Types: 2 drink(s) per week    Comment: occasional  . Drug use: No     Allergies   Iodine and Penicillins   Review of Systems Review of Systems  Reason unable to perform ROS: See HPI as above.     Physical Exam Triage Vital Signs ED Triage Vitals [03/31/18 1711]  Enc Vitals Group     BP (!) 144/87     Pulse Rate 87  Resp 16     Temp 98.3 F (36.8 C)     Temp src      SpO2 100 %     Weight      Height      Head Circumference      Peak Flow      Pain Score      Pain Loc      Pain Edu?      Excl. in [place]?    No data found.  Updated Vital Signs BP (!) 144/87   Pulse 87   Temp 98.3 F (36.8 C)   Resp 16   SpO2 100%   Physical Exam  Constitutional: She is oriented to person, place, and time. She appears well-developed and well-nourished. No distress.  HENT:  Head: Normocephalic and atraumatic.  Eyes: Pupils are equal, round, and reactive to light. Conjunctivae are normal.  Cardiovascular: Normal rate, regular rhythm and normal heart sounds. Exam reveals no gallop and no friction rub.  No murmur heard. Pulmonary/Chest: Effort normal and breath sounds normal. No accessory muscle usage or stridor. No respiratory distress. She has no decreased  breath sounds. She has no wheezes. She has no rhonchi. She has no rales.  Musculoskeletal:  No tenderness on palpation of the spinous processes, bilateral back. Tenderness to palpation of right buttock, posterior hip and proximal leg. No tenderness to palpation of lateral/medial/dorsal aspect of the leg. Full passive ROM of hip. Strength deferred. Sensation intact and equal bilaterally. Negative straight leg raise.   Neurological: She is alert and oriented to person, place, and time.  Skin: Skin is warm and dry. She is not diaphoretic.    UC Treatments / Results  Labs (all labs ordered are listed, but only abnormal results are displayed) Labs Reviewed - No data to display  EKG None  Radiology No results found.  Procedures Procedures (including critical care time)  Medications Ordered in UC Medications  ketorolac (TORADOL) 30 MG/ML injection 30 mg (30 mg Intramuscular Given 03/31/18 1746)    Initial Impression / Assessment and Plan / UC Course  I have reviewed the triage vital signs and the nursing notes.  Pertinent labs & imaging results that were available during my care of the patient were reviewed by me and considered in my medical decision making (see chart for details).    Will treat for muscle strain given history and exam. Toradol injection in office today. mobic as directed. Warm compress. Return precautions given.  Final Clinical Impressions(s) / UC Diagnoses   Final diagnoses:  Right leg pain    ED Prescriptions    Medication Sig Dispense Auth. Provider   meloxicam (MOBIC) 7.5 MG tablet Take 1 tablet (7.5 mg total) by mouth daily. 15 tablet Tobin Chad, [place] 03/31/18 1754

## 2018-03-31 NOTE — Discharge Instructions (Signed)
Start Mobic. Do not take ibuprofen (motrin/advil)/ naproxen (aleve) while on mobic. Heat compresses as needed. This can take up to 3-4 weeks to completely resolve, but you should be feeling better each week. Follow up here or with PCP if symptoms worsen, changes for reevaluation. If experience numbness/tingling of the inner thighs, loss of bladder or bowel control, go to the emergency department for evaluation.

## 2018-04-26 ENCOUNTER — Ambulatory Visit (INDEPENDENT_AMBULATORY_CARE_PROVIDER_SITE_OTHER): Payer: BC Managed Care – PPO | Admitting: Licensed Clinical Social Worker

## 2018-04-26 DIAGNOSIS — F315 Bipolar disorder, current episode depressed, severe, with psychotic features: Secondary | ICD-10-CM

## 2018-05-23 ENCOUNTER — Ambulatory Visit (INDEPENDENT_AMBULATORY_CARE_PROVIDER_SITE_OTHER): Payer: BC Managed Care – PPO | Admitting: Licensed Clinical Social Worker

## 2018-05-23 DIAGNOSIS — F315 Bipolar disorder, current episode depressed, severe, with psychotic features: Secondary | ICD-10-CM | POA: Diagnosis not present

## 2018-06-21 ENCOUNTER — Ambulatory Visit (INDEPENDENT_AMBULATORY_CARE_PROVIDER_SITE_OTHER): Payer: BC Managed Care – PPO | Admitting: Licensed Clinical Social Worker

## 2018-06-21 DIAGNOSIS — F315 Bipolar disorder, current episode depressed, severe, with psychotic features: Secondary | ICD-10-CM | POA: Diagnosis not present

## 2018-06-28 ENCOUNTER — Ambulatory Visit (INDEPENDENT_AMBULATORY_CARE_PROVIDER_SITE_OTHER): Payer: BC Managed Care – PPO | Admitting: Licensed Clinical Social Worker

## 2018-06-28 DIAGNOSIS — F315 Bipolar disorder, current episode depressed, severe, with psychotic features: Secondary | ICD-10-CM | POA: Diagnosis not present

## 2018-07-12 ENCOUNTER — Ambulatory Visit: Payer: BC Managed Care – PPO | Admitting: Licensed Clinical Social Worker

## 2018-07-12 DIAGNOSIS — F315 Bipolar disorder, current episode depressed, severe, with psychotic features: Secondary | ICD-10-CM

## 2018-07-19 ENCOUNTER — Ambulatory Visit (INDEPENDENT_AMBULATORY_CARE_PROVIDER_SITE_OTHER): Payer: BC Managed Care – PPO | Admitting: Licensed Clinical Social Worker

## 2018-07-19 DIAGNOSIS — F315 Bipolar disorder, current episode depressed, severe, with psychotic features: Secondary | ICD-10-CM | POA: Diagnosis not present

## 2018-08-27 ENCOUNTER — Ambulatory Visit: Payer: BC Managed Care – PPO | Admitting: Licensed Clinical Social Worker

## 2018-08-31 ENCOUNTER — Ambulatory Visit: Payer: BC Managed Care – PPO | Admitting: Licensed Clinical Social Worker

## 2018-09-12 ENCOUNTER — Ambulatory Visit: Payer: BC Managed Care – PPO | Admitting: Licensed Clinical Social Worker

## 2018-09-13 ENCOUNTER — Encounter (HOSPITAL_COMMUNITY): Payer: Self-pay

## 2018-09-13 ENCOUNTER — Other Ambulatory Visit: Payer: Self-pay

## 2018-09-13 ENCOUNTER — Emergency Department (HOSPITAL_COMMUNITY)
Admission: EM | Admit: 2018-09-13 | Discharge: 2018-09-13 | Disposition: A | Discharge status: Home / Self Care | Payer: BC Managed Care – PPO | Attending: Emergency Medicine | Admitting: Emergency Medicine

## 2018-09-13 DIAGNOSIS — R51 Headache: Secondary | ICD-10-CM | POA: Diagnosis present

## 2018-09-13 DIAGNOSIS — Z79899 Other long term (current) drug therapy: Secondary | ICD-10-CM | POA: Insufficient documentation

## 2018-09-13 DIAGNOSIS — G44209 Tension-type headache, unspecified, not intractable: Secondary | ICD-10-CM | POA: Insufficient documentation

## 2018-09-13 NOTE — Discharge Instructions (Addendum)
Your blood pressure slightly elevated, otherwise your vital signs within normal limits.  Please use Tylenol every 4 hours, or ibuprofen every 6 hours for headache.  Please return to the emergency department immediately if any changes in your condition, changes in the headache, problems, or concerns.

## 2018-09-13 NOTE — ED Triage Notes (Signed)
Pt reports waking up with headache, left temporal, pt says, she could "feel the vein on that side, but not the other side." Denies vision changes or photophobia, no other symptoms reported. Pt reports being on antibiotic for strep throat since Monday.

## 2018-09-13 NOTE — ED Provider Notes (Signed)
[facility] EMERGENCY DEPARTMENT Provider Note   CSN: [id number] Arrival date & time: 09/13/18  7062     History   Chief Complaint Chief Complaint  Patient presents with  . Headache    HPI Debra Buchanan is a 63 y.o. female.  Patient is a 63 year old female who presents to the emergency department with complaint of headache.  The patient states that she awakened approximately 5 or 530 this morning with headache on the left side.  She says she felt as though she could feel the vein on the left side and could not feel a vein on the right side.  She says she does not get a lot of headaches.  She is not had any recent injury or trauma to the head.  It is of note that she is being treated for strep throat and has been taking medication for cough.  Otherwise she has not had any recent changes in medication.  There is been no recent changes in diet.  The patient has not been out of the country recently.  Patient does state that she did not rest well on last evening.  Patient reports that the headache has resolved since being here in the emergency department.  The history is provided by the patient.  Headache  Associated symptoms: cough and sore throat   Associated symptoms: no abdominal pain, no back pain, no dizziness, no neck pain, no photophobia and no seizures     Past Medical History:  Diagnosis Date  . Allergy   . Anemia   . Anxiety   . Bipolar disorder [facility])     Patient Active Problem List   Diagnosis Date Noted  . Abnormal involuntary movement 03/23/2016  . Paresthesia 03/23/2016  . Motor tic disorder 03/23/2016  . Esophageal reflux 06/21/2011  . Special screening for malignant neoplasms, colon 06/21/2011    Past Surgical History:  Procedure Laterality Date  . CARPAL TUNNEL RELEASE  2001  . [place]  . TUBAL LIGATION       OB History   No obstetric history on file.      Home Medications    Prior to Admission medications   Medication  Sig Start Date End Date Taking? Authorizing Provider  amoxicillin (AMOXIL) 875 MG tablet Take 875 mg by mouth 2 (two) times daily.   Yes [provider]  HYDROcodone-Chlorpheniramine 5-4 MG/5ML SOLN Take by mouth.   Yes [provider]  clonazePAM (KLONOPIN) 0.5 MG tablet Take 1 tablet (0.5 mg total) by mouth 2 (two) times daily. Patient not taking: Reported on 03/31/2018 09/04/17   Kathrynn Ducking, MD  diclofenac (VOLTAREN) 75 MG EC tablet Take 1 tablet (75 mg total) by mouth 2 (two) times daily with a meal. Patient not taking: Reported on 03/31/2018 09/26/17   Sanjuana Kava, MD  gabapentin (NEURONTIN) 300 MG capsule Take 1 capsule (300 mg total) by mouth 3 (three) times daily. Patient not taking: Reported on 03/31/2018 08/03/16   Kathrynn Ducking, MD  lamoTRIgine (LAMICTAL) 200 MG tablet Take 200 mg by mouth daily.      [provider]  meloxicam (MOBIC) 7.5 MG tablet Take 1 tablet (7.5 mg total) by mouth daily. 03/31/18   Tasia Catchings, Amy V, PA-C  QUEtiapine Fumarate (SEROQUEL PO) Take by mouth.    [provider]    Family History Family History  Problem Relation Age of Onset  . Breast cancer Mother   . Emphysema Father   .  Heart attack Paternal Grandmother   . Colon cancer Neg Hx   . Esophageal cancer Neg Hx   . Stomach cancer Neg Hx     Social History Social History   Tobacco Use  . Smoking status: Never Smoker  . Smokeless tobacco: Never Used  Substance Use Topics  . Alcohol use: Yes    Alcohol/week: 2.0 standard drinks    Types: 2 drink(s) per week    Comment: occasional  . Drug use: No     Allergies   Iodine and Penicillins   Review of Systems Review of Systems  Constitutional: Negative for activity change.       All ROS Neg except as noted in HPI  HENT: Positive for sore throat. Negative for nosebleeds.   Eyes: Negative for photophobia and discharge.  Respiratory: Positive for cough. Negative for shortness of breath and wheezing.     Cardiovascular: Negative for chest pain and palpitations.  Gastrointestinal: Negative for abdominal pain and blood in stool.  Genitourinary: Negative for dysuria, frequency and hematuria.  Musculoskeletal: Negative for arthralgias, back pain and neck pain.  Skin: Negative.   Neurological: Positive for headaches. Negative for dizziness, seizures and speech difficulty.  Psychiatric/Behavioral: Negative for confusion and hallucinations.     Physical Exam Updated Vital Signs BP (!) 144/96 (BP Location: Right Arm)   Pulse 90   Temp 98.6 F (37 C) (Oral)   Resp 18   Ht 5\' 6"  (1.676 m)   Wt 76.2 kg   SpO2 100%   BMI 27.12 kg/m   Physical Exam Vitals signs and nursing note reviewed.  Constitutional:      General: She is not in acute distress.    Appearance: She is well-developed. She is not toxic-appearing.  HENT:     Head: Normocephalic and atraumatic.     Right Ear: Tympanic membrane and external ear normal.     Left Ear: Tympanic membrane and external ear normal.  Eyes:     General: Lids are normal. No scleral icterus.       Right eye: No discharge.        Left eye: No discharge.     Conjunctiva/sclera: Conjunctivae normal.     Pupils: Pupils are equal, round, and reactive to light.  Neck:     Musculoskeletal: Normal range of motion and neck supple.     Vascular: No carotid bruit.     Trachea: No tracheal deviation.  Cardiovascular:     Rate and Rhythm: Normal rate and regular rhythm.     Pulses: Normal pulses.     Heart sounds: Normal heart sounds.  Pulmonary:     Effort: Pulmonary effort is normal. No respiratory distress.     Breath sounds: Normal breath sounds. No stridor. No wheezing or rales.  Abdominal:     General: Bowel sounds are normal. There is no distension.     Palpations: Abdomen is soft.     Tenderness: There is no abdominal tenderness. There is no guarding or rebound.  Musculoskeletal: Normal range of motion.        General: No tenderness.   Lymphadenopathy:     Head:     Right side of head: No submandibular adenopathy.     Left side of head: No submandibular adenopathy.     Cervical: No cervical adenopathy.  Skin:    General: Skin is warm and dry.     Findings: No rash.  Neurological:     Mental Status: She is alert and  oriented to person, place, and time.     GCS: GCS eye subscore is 4. GCS verbal subscore is 5. GCS motor subscore is 6.     Cranial Nerves: No cranial nerve deficit or facial asymmetry.     Sensory: No sensory deficit.     Motor: No weakness, abnormal muscle tone or seizure activity.     Coordination: Coordination normal.     Gait: Gait normal.     Comments: Patient ambulated in the room, as well as in the hall.  She denies any balance issues.  Gait is intact.  Patient is awake, alert, oriented to person, place, time, and situation.  Psychiatric:        Speech: Speech normal.      ED Treatments / Results  Labs (all labs ordered are listed, but only abnormal results are displayed) Labs Reviewed - No data to display  EKG None  Radiology No results found.  Procedures Procedures (including critical care time)  Medications Ordered in ED Medications - No data to display   Initial Impression / Assessment and Plan / ED Course  I have reviewed the triage vital signs and the nursing notes.  Pertinent labs & imaging results that were available during my care of the patient were reviewed by me and considered in my medical decision making (see chart for details).       Final Clinical Impressions(s) / ED Diagnoses MDM  Blood pressure slightly elevated at 144/96, otherwise vital signs are within normal limits.  No gross neurologic deficits appreciated.  The examination favors muscle contraction type headache.  Headache has resolved at this time.  The patient is awake, alert, oriented to person place time and situation.  I have discussed the findings with the patient in terms which she  understands.  I have asked her to return if she has additional issues with headache.  I have asked her to use Tylenol every 4 hours or ibuprofen every 6 hours for mild pain and discomfort.  The patient acknowledges understanding of these instructions, as well as need to return.   Final diagnoses:  Muscle contraction headache    ED Discharge Orders    None       Lily Kocher, PA-C 09/13/18 [place], [place], DO 09/13/18 1335

## 2018-10-03 ENCOUNTER — Ambulatory Visit: Payer: BC Managed Care – PPO | Admitting: Licensed Clinical Social Worker

## 2018-10-03 DIAGNOSIS — F315 Bipolar disorder, current episode depressed, severe, with psychotic features: Secondary | ICD-10-CM | POA: Diagnosis not present

## 2018-10-25 ENCOUNTER — Ambulatory Visit (INDEPENDENT_AMBULATORY_CARE_PROVIDER_SITE_OTHER): Payer: BC Managed Care – PPO | Admitting: Licensed Clinical Social Worker

## 2018-10-25 DIAGNOSIS — F315 Bipolar disorder, current episode depressed, severe, with psychotic features: Secondary | ICD-10-CM

## 2018-11-07 ENCOUNTER — Ambulatory Visit: Payer: BC Managed Care – PPO | Admitting: Licensed Clinical Social Worker

## 2018-11-07 DIAGNOSIS — F315 Bipolar disorder, current episode depressed, severe, with psychotic features: Secondary | ICD-10-CM

## 2018-11-21 ENCOUNTER — Ambulatory Visit: Payer: BC Managed Care – PPO | Admitting: Licensed Clinical Social Worker

## 2018-11-21 ENCOUNTER — Telehealth: Payer: Self-pay | Admitting: Orthopaedic Surgery

## 2018-11-21 MED ORDER — PREDNISONE 10 MG (21) PO TBPK: ORAL_TABLET | ORAL | 1 refills | Status: DC

## 2018-11-21 MED ORDER — HYDROCODONE-ACETAMINOPHEN 5-325 MG PO TABS: ORAL_TABLET | ORAL | 0 refills | Status: DC

## 2018-11-21 NOTE — Telephone Encounter (Signed)
Patient called wanting to come in with a new problem(L Shoulder Pain) Stated that she only reached over to pick up a piece of paper off the copier and it started hurting. It doesn't hurt her when she raises it but when she brings it down it does. She is asking if there is something OTC or you can prescribe to use until this virus mess is over , but will come in if you think she needs to.   PATIENT USES [facility]

## 2018-11-29 ENCOUNTER — Ambulatory Visit (INDEPENDENT_AMBULATORY_CARE_PROVIDER_SITE_OTHER): Payer: BC Managed Care – PPO | Admitting: Licensed Clinical Social Worker

## 2018-11-29 DIAGNOSIS — F315 Bipolar disorder, current episode depressed, severe, with psychotic features: Secondary | ICD-10-CM | POA: Diagnosis not present

## 2018-11-30 ENCOUNTER — Ambulatory Visit: Payer: BC Managed Care – PPO | Admitting: Licensed Clinical Social Worker

## 2018-12-19 ENCOUNTER — Ambulatory Visit (INDEPENDENT_AMBULATORY_CARE_PROVIDER_SITE_OTHER): Payer: BC Managed Care – PPO | Admitting: Licensed Clinical Social Worker

## 2018-12-19 DIAGNOSIS — F315 Bipolar disorder, current episode depressed, severe, with psychotic features: Secondary | ICD-10-CM

## 2018-12-21 ENCOUNTER — Ambulatory Visit: Payer: BC Managed Care – PPO | Admitting: Licensed Clinical Social Worker

## 2019-01-02 ENCOUNTER — Ambulatory Visit (INDEPENDENT_AMBULATORY_CARE_PROVIDER_SITE_OTHER): Payer: BC Managed Care – PPO | Admitting: Licensed Clinical Social Worker

## 2019-01-02 DIAGNOSIS — F315 Bipolar disorder, current episode depressed, severe, with psychotic features: Secondary | ICD-10-CM

## 2019-01-17 ENCOUNTER — Ambulatory Visit (INDEPENDENT_AMBULATORY_CARE_PROVIDER_SITE_OTHER): Payer: BC Managed Care – PPO | Admitting: Licensed Clinical Social Worker

## 2019-01-17 DIAGNOSIS — F315 Bipolar disorder, current episode depressed, severe, with psychotic features: Secondary | ICD-10-CM | POA: Diagnosis not present

## 2019-01-23 ENCOUNTER — Ambulatory Visit: Payer: BC Managed Care – PPO | Admitting: Licensed Clinical Social Worker

## 2019-02-05 ENCOUNTER — Ambulatory Visit: Payer: BC Managed Care – PPO | Admitting: Licensed Clinical Social Worker

## 2019-02-06 ENCOUNTER — Ambulatory Visit (INDEPENDENT_AMBULATORY_CARE_PROVIDER_SITE_OTHER): Payer: BC Managed Care – PPO | Admitting: Licensed Clinical Social Worker

## 2019-02-06 DIAGNOSIS — F315 Bipolar disorder, current episode depressed, severe, with psychotic features: Secondary | ICD-10-CM

## 2019-02-27 ENCOUNTER — Ambulatory Visit: Payer: BC Managed Care – PPO | Admitting: Licensed Clinical Social Worker

## 2019-02-28 ENCOUNTER — Ambulatory Visit: Payer: Self-pay

## 2019-02-28 ENCOUNTER — Telehealth: Payer: Self-pay | Admitting: Internal Medicine

## 2019-02-28 DIAGNOSIS — Z20822 Contact with and (suspected) exposure to covid-19: Secondary | ICD-10-CM

## 2019-02-28 NOTE — Telephone Encounter (Signed)
Patient called and says she's in [place], [place] and would like to know about getting tested when she comes back on Saturday. I advised I can schedule, since she's a resident of [facility]. Appointment scheduled for Monday, 03/04/19 at 1200 at [facility], advised of location and to wear a mask for everyone in the vehicle, she verbalized understanding. Order placed.

## 2019-03-04 ENCOUNTER — Other Ambulatory Visit: Payer: Self-pay

## 2019-03-04 DIAGNOSIS — Z20822 Contact with and (suspected) exposure to covid-19: Secondary | ICD-10-CM

## 2019-03-06 ENCOUNTER — Ambulatory Visit (INDEPENDENT_AMBULATORY_CARE_PROVIDER_SITE_OTHER): Payer: BC Managed Care – PPO | Admitting: Licensed Clinical Social Worker

## 2019-03-06 DIAGNOSIS — F315 Bipolar disorder, current episode depressed, severe, with psychotic features: Secondary | ICD-10-CM

## 2019-03-09 LAB — NOVEL CORONAVIRUS, NAA: SARS-CoV-2, NAA: NOT DETECTED

## 2019-03-14 ENCOUNTER — Ambulatory Visit (INDEPENDENT_AMBULATORY_CARE_PROVIDER_SITE_OTHER): Payer: BC Managed Care – PPO | Admitting: Licensed Clinical Social Worker

## 2019-03-14 DIAGNOSIS — F315 Bipolar disorder, current episode depressed, severe, with psychotic features: Secondary | ICD-10-CM | POA: Diagnosis not present

## 2019-03-19 ENCOUNTER — Other Ambulatory Visit: Payer: Self-pay | Admitting: Internal Medicine

## 2019-03-19 ENCOUNTER — Other Ambulatory Visit: Payer: Self-pay

## 2019-03-19 ENCOUNTER — Other Ambulatory Visit (HOSPITAL_COMMUNITY): Payer: Self-pay | Admitting: Internal Medicine

## 2019-03-19 ENCOUNTER — Encounter: Payer: Self-pay | Admitting: Cardiology

## 2019-03-19 ENCOUNTER — Ambulatory Visit (HOSPITAL_COMMUNITY)
Admission: RE | Admit: 2019-03-19 | Discharge: 2019-03-19 | Disposition: A | Discharge status: Home / Self Care | Payer: BC Managed Care – PPO | Source: Ambulatory Visit | Attending: Internal Medicine | Admitting: Internal Medicine

## 2019-03-19 DIAGNOSIS — S069X9A Unspecified intracranial injury with loss of consciousness of unspecified duration, initial encounter: Secondary | ICD-10-CM | POA: Insufficient documentation

## 2019-03-19 DIAGNOSIS — R4020 Unspecified coma: Secondary | ICD-10-CM

## 2019-04-04 ENCOUNTER — Ambulatory Visit (INDEPENDENT_AMBULATORY_CARE_PROVIDER_SITE_OTHER): Payer: BC Managed Care – PPO | Admitting: Adult Health

## 2019-04-04 ENCOUNTER — Other Ambulatory Visit: Payer: Self-pay

## 2019-04-04 DIAGNOSIS — F4323 Adjustment disorder with mixed anxiety and depressed mood: Secondary | ICD-10-CM | POA: Diagnosis not present

## 2019-04-04 DIAGNOSIS — F331 Major depressive disorder, recurrent, moderate: Secondary | ICD-10-CM

## 2019-04-04 DIAGNOSIS — G4700 Insomnia, unspecified: Secondary | ICD-10-CM

## 2019-04-04 DIAGNOSIS — F411 Generalized anxiety disorder: Secondary | ICD-10-CM | POA: Diagnosis not present

## 2019-04-05 NOTE — Progress Notes (Signed)
Debra Buchanan [id number] 10-12-1955 63 y.o.  Virtual Visit via Video Note  I connected with@ on 04/21/19 at  5:00 PM EDT by a video enabled telemedicine application and verified that I am speaking with the correct person using two identifiers.   I discussed the limitations of evaluation and management by telemedicine and the availability of in person appointments. The patient expressed understanding and agreed to proceed.  I discussed the assessment and treatment plan with the patient. The patient was provided an opportunity to ask questions and all were answered. The patient agreed with the plan and demonstrated an understanding of the instructions.   The patient was advised to call back or seek an in-person evaluation if the symptoms worsen or if the condition fails to improve as anticipated.  I provided 30 minutes of non-face-to-face time during this encounter.  The patient was located at home.  The provider was located at [place].   Aloha Gell, NP   Subjective:   Patient ID:  Debra Buchanan is a 63 y.o. (DOB 02-28-1956) female.  Chief Complaint: No chief complaint on file.   HPI Debra Buchanan presents for follow-up of  Anxiety, depression, and insomnia.  Describes mood today as "ok". Pleasant. Mood symptoms - reports depression, anxiety, and irritability "at times". Currently concerned about returning to work with the potential implications of SAYTK-16 and lack of social distancing and compliance with mandated regulations. 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 She is in the "high risk" category and is concerned about her safety.  Would feel more comfortable working from home. Plans to discuss with superior. Stable interest and motivation. Taking medications as prescribed.  Energy levels stable. Active, but does not have a regular exercise program. Walking some days. Enjoys some usual interests and activities.  Spending time with family - dog. Talking with friends. Appetite adequate.  Weight stable. Sleeps well most nights. Averages 6 to 8 hours with Seroquel. Focus and concentration stable. Completing tasks. Managing aspects of household.  Denies SI or HI.  Denies AH or VH.  Review of Systems:  Review of Systems  Musculoskeletal: Negative for gait problem.  Neurological: Negative for tremors.    Medications: I have reviewed the patient's current medications.  Current Outpatient Medications  Medication Sig Dispense Refill  . amoxicillin (AMOXIL) 875 MG tablet Take 875 mg by mouth 2 (two) times daily.    . clonazePAM (KLONOPIN) 0.5 MG tablet Take 1 tablet (0.5 mg total) by mouth 2 (two) times daily. (Patient not taking: Reported on 03/31/2018) 60 tablet 3  . diclofenac (VOLTAREN) 75 MG EC tablet Take 1 tablet (75 mg total) by mouth 2 (two) times daily with a meal. (Patient not taking: Reported on 03/31/2018) 60 tablet 2  . gabapentin (NEURONTIN) 300 MG capsule Take 1 capsule (300 mg total) by mouth 3 (three) times daily. (Patient not taking: Reported on 03/31/2018) 90 capsule 11  . HYDROcodone-acetaminophen (NORCO/VICODIN) 5-325 MG tablet One tablet every four hours as needed for acute pain.  Limit of five days per [place] statue. 30 tablet 0  . HYDROcodone-Chlorpheniramine 5-4 MG/5ML SOLN Take by mouth.    . lamoTRIgine (LAMICTAL) 200 MG tablet Take 200 mg by mouth daily.      . meloxicam (MOBIC) 7.5 MG tablet Take 1 tablet (7.5 mg total) by mouth daily. 15 tablet 0  . predniSONE (STERAPRED UNI-PAK 21 TAB) 10 MG (21) TBPK tablet Take six pills the first day;5  pills the next day;4 pills the next day;3 pills the next day; 2 pills the next day,one the final day. 21 tablet 1  . QUEtiapine Fumarate (SEROQUEL PO) Take by mouth.     No current facility-administered medications for this visit.     Medication Side Effects: None  Allergies:  Allergies  Allergen Reactions  . Iodine Swelling  .  Penicillins     Unsure of reaction    Past Medical History:  Diagnosis Date  . Allergy   . Anemia   . Anxiety   . Bipolar disorder ([place])     Family History  Problem Relation Age of Onset  . Breast cancer Mother   . Emphysema Father   . Heart attack Paternal Grandmother   . Colon cancer Neg Hx   . Esophageal cancer Neg Hx   . Stomach cancer Neg Hx     Social History   Socioeconomic History  . Marital status: Divorced    Spouse name: Not on file  . Number of children: 2  . Years of education: Not on file  . Highest education level: Not on file  Occupational History  . Occupation: Fish farm manager: [employer]  Social Needs  . Financial resource strain: Not on file  . Food insecurity    Worry: Not on file    Inability: Not on file  . Transportation needs    Medical: Not on file    Non-medical: Not on file  Tobacco Use  . Smoking status: Never Smoker  . Smokeless tobacco: Never Used  Substance and Sexual Activity  . Alcohol use: Yes    Alcohol/week: 2.0 standard drinks    Types: 2 drink(s) per week    Comment: occasional  . Drug use: No  . Sexual activity: Not on file  Lifestyle  . Physical activity    Days per week: Not on file    Minutes per session: Not on file  . Stress: Not on file  Relationships  . Social Herbalist on phone: Not on file    Gets together: Not on file    Attends religious service: Not on file    Active member of club or organization: Not on file    Attends meetings of clubs or organizations: Not on file    Relationship status: Not on file  . Intimate partner violence    Fear of current or ex partner: Not on file    Emotionally abused: Not on file    Physically abused: Not on file    Forced sexual activity: Not on file  Other Topics Concern  . Not on file  Social History Narrative   Lives at home alone   Right-handed   Rare caffeine use    Past Medical History, Surgical history, Social  history, and Family history were reviewed and updated as appropriate.   Please see review of systems for further details on the patient's review from today.   Objective:   Physical Exam:  There were no vitals taken for this visit.  Physical Exam Neurological:     Mental Status: She is alert and oriented to person, place, and time.     Cranial Nerves: No dysarthria.  Psychiatric:        Attention and Perception: Attention normal.        Mood and Affect: Mood normal.        Speech: Speech normal.  Behavior: Behavior is cooperative.        Thought Content: Thought content normal. Thought content is not paranoid or delusional. Thought content does not include homicidal or suicidal ideation. Thought content does not include homicidal or suicidal plan.        Cognition and Memory: Cognition and memory normal.        Judgment: Judgment normal.     Lab Review:     Component Value Date/Time   NA 138 09/29/2010 2228   K 3.6 09/29/2010 2228   CL 105 09/29/2010 2228   CO2 26 09/29/2010 2228   GLUCOSE 120 (H) 09/29/2010 2228   BUN 12 09/29/2010 2228   CREATININE 0.87 09/29/2010 2228   CALCIUM 9.3 09/29/2010 2228   GFRNONAA >60 09/29/2010 2228   GFRAA  09/29/2010 2228    >60        The eGFR has been calculated using the MDRD equation. This calculation has not been validated in all clinical situations. eGFR's persistently <60 mL/min signify possible Chronic Kidney Disease.       Component Value Date/Time   WBC 5.8 01/26/2013 1254   WBC 7.4 09/29/2010 2228   RBC 3.76 (A) 01/26/2013 1254   RBC 3.86 (L) 09/29/2010 2228   HGB 11.1 (A) 01/26/2013 1254   HGB 11.7 (L) 09/29/2010 2228   HCT 34.9 (A) 01/26/2013 1254   HCT 34.2 (L) 09/29/2010 2228   PLT 282 09/29/2010 2228   MCV 92.9 01/26/2013 1254   MCH 29.5 01/26/2013 1254   MCH 30.3 09/29/2010 2228   MCHC 31.8 01/26/2013 1254   MCHC 34.2 09/29/2010 2228   RDW 12.4 09/29/2010 2228   LYMPHSABS 2.6 09/29/2010 2228    MONOABS 0.7 09/29/2010 2228   EOSABS 0.3 09/29/2010 2228   BASOSABS 0.0 09/29/2010 2228    No results found for: POCLITH, LITHIUM   No results found for: PHENYTOIN, PHENOBARB, VALPROATE, CBMZ   .res Assessment: Plan:    Plan:  1. Diazepam 5 mg BID 2. Seroquel 91m - 3 at hs 3. Lamictal 2023mat hs  Will fill out FMLA paperwork for patient - high risk for Coronavirus.  RTC 4 weeks  Patient advised to contact office with any questions, adverse effects, or acute worsening in signs and symptoms.  Discussed potential metabolic side effects associated with atypical antipsychotics, as well as potential risk for movement side effects. Advised pt to contact office if movement side effects occur.   Discussed potential benefits, risk, and side effects of benzodiazepines to include potential risk of tolerance and dependence, as well as possible drowsiness.  Advised patient not to drive if experiencing drowsiness and to take lowest possible effective dose to minimize risk of dependence and tolerance.  Counseled patient regarding potential benefits, risks, and side effects of Lamictal to include potential risk of Stevens-Johnson syndrome. Advised patient to stop taking Lamictal and contact office immediately if rash develops and to seek urgent medical attention if rash is severe and/or spreading quickly.  Diagnoses and all orders for this visit:  Generalized anxiety disorder  Major depressive disorder, recurrent episode, moderate (HCC)  Insomnia, unspecified type  Adjustment disorder with mixed anxiety and depressed mood     Please see After Visit Summary for patient specific instructions.  No future appointments.  No orders of the defined types were placed in this encounter.     -------------------------------

## 2019-04-16 ENCOUNTER — Ambulatory Visit (INDEPENDENT_AMBULATORY_CARE_PROVIDER_SITE_OTHER): Payer: BC Managed Care – PPO | Admitting: Adult Health

## 2019-04-16 ENCOUNTER — Ambulatory Visit (INDEPENDENT_AMBULATORY_CARE_PROVIDER_SITE_OTHER): Payer: BC Managed Care – PPO | Admitting: Licensed Clinical Social Worker

## 2019-04-16 ENCOUNTER — Encounter: Payer: Self-pay | Admitting: Adult Health

## 2019-04-16 ENCOUNTER — Other Ambulatory Visit: Payer: Self-pay

## 2019-04-16 DIAGNOSIS — F331 Major depressive disorder, recurrent, moderate: Secondary | ICD-10-CM

## 2019-04-16 DIAGNOSIS — F3173 Bipolar disorder, in partial remission, most recent episode manic: Secondary | ICD-10-CM

## 2019-04-16 DIAGNOSIS — G4700 Insomnia, unspecified: Secondary | ICD-10-CM

## 2019-04-16 DIAGNOSIS — F411 Generalized anxiety disorder: Secondary | ICD-10-CM

## 2019-04-16 NOTE — Progress Notes (Signed)
[name] [id number] 1956/01/31 63 y.o.  Subjective:   Patient ID:  Debra Buchanan is a 63 y.o. (DOB 04-18-1956) female.  Chief Complaint: No chief complaint on file.   HPI [name] presents to the office today for follow-up of anxiety, depression, and insomnia.  Describes mood today as "so-so". Pleasant. Mood symptoms - reports  depression, anxiety, and irritability at times. Is concerned about returning to work setting due to [employer]. Does not feel work environment is safe - other employees not following suggested guidelines and restrictions of wearing masks, social distancing, and hand hygiene. She is considered a higher risk for contracting virus due to her age. Is wanting to continue working and is hoping to work from home. Stable interest and motivation. Taking medications as prescribed.  Energy levels stable. Active, but does not have a regular exercise routine. 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 Spending time with family. Talking with friends. Mostly staying at home.  Appetite adequate. Weight stable. Sleeps better some nights than others. Averages 6 hours. Focus and concentration stable. Completing tasks. Managing aspects of household.  Denies SI or HI.  Denies AH or VH.   Review of Systems:  Review of Systems  Musculoskeletal: Negative for gait problem.  Neurological: Negative for tremors.  Psychiatric/Behavioral:       Please refer to HPI    Medications: I have reviewed the patient's current medications.  Current Outpatient Medications  Medication Sig Dispense Refill  . amoxicillin (AMOXIL) 875 MG tablet Take 875 mg by mouth 2 (two) times daily.    . clonazePAM (KLONOPIN) 0.5 MG tablet Take 1 tablet (0.5 mg total) by mouth 2 (two) times daily. (Patient not taking: Reported on 03/31/2018) 60 tablet 3  . diclofenac (VOLTAREN) 75 MG EC tablet Take 1 tablet (75 mg total) by mouth 2 (two) times daily with a meal. (Patient  not taking: Reported on 03/31/2018) 60 tablet 2  . gabapentin (NEURONTIN) 300 MG capsule Take 1 capsule (300 mg total) by mouth 3 (three) times daily. (Patient not taking: Reported on 03/31/2018) 90 capsule 11  . HYDROcodone-acetaminophen (NORCO/VICODIN) 5-325 MG tablet One tablet every four hours as needed for acute pain.  Limit of five days per [place] statue. 30 tablet 0  . HYDROcodone-Chlorpheniramine 5-4 MG/5ML SOLN Take by mouth.    . lamoTRIgine (LAMICTAL) 200 MG tablet Take 200 mg by mouth daily.      . meloxicam (MOBIC) 7.5 MG tablet Take 1 tablet (7.5 mg total) by mouth daily. 15 tablet 0  . predniSONE (STERAPRED UNI-PAK 21 TAB) 10 MG (21) TBPK tablet Take six pills the first day;5 pills the next day;4 pills the next day;3 pills the next day; 2 pills the next day,one the final day. 21 tablet 1  . QUEtiapine Fumarate (SEROQUEL PO) Take by mouth.     No current facility-administered medications for this visit.     Medication Side Effects: None  Allergies:  Allergies  Allergen Reactions  . Iodine Swelling  . Penicillins     Unsure of reaction    Past Medical History:  Diagnosis Date  . Allergy   . Anemia   . Anxiety   . Bipolar disorder ([place])     Family History  Problem Relation Age of Onset  . Breast cancer Mother   . Emphysema Father   . Heart attack Paternal Grandmother   . Colon cancer Neg Hx   . Esophageal cancer Neg Hx   .  Stomach cancer Neg Hx     Social History   Socioeconomic History  . Marital status: Divorced    Spouse name: Not on file  . Number of children: 2  . Years of education: Not on file  . Highest education level: Not on file  Occupational History  . Occupation: Fish farm manager: [employer]  Social Needs  . Financial resource strain: Not on file  . Food insecurity    Worry: Not on file    Inability: Not on file  . Transportation needs    Medical: Not on file    Non-medical: Not on file  Tobacco Use  .  Smoking status: Never Smoker  . Smokeless tobacco: Never Used  Substance and Sexual Activity  . Alcohol use: Yes    Alcohol/week: 2.0 standard drinks    Types: 2 drink(s) per week    Comment: occasional  . Drug use: No  . Sexual activity: Not on file  Lifestyle  . Physical activity    Days per week: Not on file    Minutes per session: Not on file  . Stress: Not on file  Relationships  . Social Herbalist on phone: Not on file    Gets together: Not on file    Attends religious service: Not on file    Active member of club or organization: Not on file    Attends meetings of clubs or organizations: Not on file    Relationship status: Not on file  . Intimate partner violence    Fear of current or ex partner: Not on file    Emotionally abused: Not on file    Physically abused: Not on file    Forced sexual activity: Not on file  Other Topics Concern  . Not on file  Social History Narrative   Lives at home alone   Right-handed   Rare caffeine use    Past Medical History, Surgical history, Social history, and Family history were reviewed and updated as appropriate.   Please see review of systems for further details on the patient's review from today.   Objective:   Physical Exam:  There were no vitals taken for this visit.  Physical Exam Constitutional:      General: She is not in acute distress.    Appearance: She is well-developed.  Musculoskeletal:        General: No deformity.  Neurological:     Mental Status: She is alert and oriented to person, place, and time.     Coordination: Coordination normal.  Psychiatric:        Attention and Perception: Attention and perception normal. She does not perceive auditory or visual hallucinations.        Mood and Affect: Mood normal. Mood is not anxious or depressed. Affect is not labile, blunt, angry or inappropriate.        Speech: Speech normal.        Behavior: Behavior normal.        Thought Content: Thought  content normal. Thought content is not paranoid or delusional. Thought content does not include homicidal or suicidal ideation. Thought content does not include homicidal or suicidal plan.        Cognition and Memory: Cognition and memory normal.        Judgment: Judgment normal.     Comments: Insight intact     Lab Review:     Component Value Date/Time   NA  138 09/29/2010 2228   K 3.6 09/29/2010 2228   CL 105 09/29/2010 2228   CO2 26 09/29/2010 2228   GLUCOSE 120 (H) 09/29/2010 2228   BUN 12 09/29/2010 2228   CREATININE 0.87 09/29/2010 2228   CALCIUM 9.3 09/29/2010 2228   GFRNONAA >60 09/29/2010 2228   GFRAA  09/29/2010 2228    >60        The eGFR has been calculated using the MDRD equation. This calculation has not been validated in all clinical situations. eGFR's persistently <60 mL/min signify possible Chronic Kidney Disease.       Component Value Date/Time   WBC 5.8 01/26/2013 1254   WBC 7.4 09/29/2010 2228   RBC 3.76 (A) 01/26/2013 1254   RBC 3.86 (L) 09/29/2010 2228   HGB 11.1 (A) 01/26/2013 1254   HGB 11.7 (L) 09/29/2010 2228   HCT 34.9 (A) 01/26/2013 1254   HCT 34.2 (L) 09/29/2010 2228   PLT 282 09/29/2010 2228   MCV 92.9 01/26/2013 1254   MCH 29.5 01/26/2013 1254   MCH 30.3 09/29/2010 2228   MCHC 31.8 01/26/2013 1254   MCHC 34.2 09/29/2010 2228   RDW 12.4 09/29/2010 2228   LYMPHSABS 2.6 09/29/2010 2228   MONOABS 0.7 09/29/2010 2228   EOSABS 0.3 09/29/2010 2228   BASOSABS 0.0 09/29/2010 2228    No results found for: POCLITH, LITHIUM   No results found for: PHENYTOIN, PHENOBARB, VALPROATE, CBMZ   .res Assessment: Plan:   Plan: 1. Continue current medication regimen. 2. Completed FMLA and [place] paperwork for patient.  RTC 4 weeks  Patient advised to contact office with any questions, adverse effects, or acute worsening in signs and symptoms.  There are no diagnoses linked to this encounter.   Please see After Visit Summary for patient  specific instructions.  Future Appointments  Date Time Provider [place]  04/16/2019 12:00 PM Darthula Desa, Berdie Ogren, NP [facility] None  04/16/2019  3:00 PM Whitt, Junita Push, LCSW [facility] None    No orders of the defined types were placed in this encounter.   -------------------------------

## 2019-04-21 ENCOUNTER — Encounter: Payer: Self-pay | Admitting: Adult Health

## 2019-04-25 ENCOUNTER — Ambulatory Visit (INDEPENDENT_AMBULATORY_CARE_PROVIDER_SITE_OTHER): Payer: BC Managed Care – PPO | Admitting: Licensed Clinical Social Worker

## 2019-04-25 DIAGNOSIS — F315 Bipolar disorder, current episode depressed, severe, with psychotic features: Secondary | ICD-10-CM

## 2019-04-30 ENCOUNTER — Ambulatory Visit: Payer: BC Managed Care – PPO | Admitting: Licensed Clinical Social Worker

## 2019-05-01 ENCOUNTER — Ambulatory Visit: Payer: BC Managed Care – PPO | Admitting: Licensed Clinical Social Worker

## 2019-05-08 ENCOUNTER — Ambulatory Visit (INDEPENDENT_AMBULATORY_CARE_PROVIDER_SITE_OTHER): Payer: BC Managed Care – PPO | Admitting: Licensed Clinical Social Worker

## 2019-05-08 DIAGNOSIS — F315 Bipolar disorder, current episode depressed, severe, with psychotic features: Secondary | ICD-10-CM

## 2019-05-23 ENCOUNTER — Ambulatory Visit (INDEPENDENT_AMBULATORY_CARE_PROVIDER_SITE_OTHER): Payer: BC Managed Care – PPO | Admitting: Licensed Clinical Social Worker

## 2019-05-23 DIAGNOSIS — F315 Bipolar disorder, current episode depressed, severe, with psychotic features: Secondary | ICD-10-CM | POA: Diagnosis not present

## 2019-06-04 ENCOUNTER — Ambulatory Visit (INDEPENDENT_AMBULATORY_CARE_PROVIDER_SITE_OTHER): Payer: BC Managed Care – PPO | Admitting: Licensed Clinical Social Worker

## 2019-06-04 DIAGNOSIS — F315 Bipolar disorder, current episode depressed, severe, with psychotic features: Secondary | ICD-10-CM

## 2019-06-18 ENCOUNTER — Ambulatory Visit (INDEPENDENT_AMBULATORY_CARE_PROVIDER_SITE_OTHER): Payer: BC Managed Care – PPO | Admitting: Licensed Clinical Social Worker

## 2019-06-18 DIAGNOSIS — F315 Bipolar disorder, current episode depressed, severe, with psychotic features: Secondary | ICD-10-CM | POA: Diagnosis not present

## 2019-06-20 ENCOUNTER — Other Ambulatory Visit: Payer: Self-pay

## 2019-06-20 DIAGNOSIS — I83893 Varicose veins of bilateral lower extremities with other complications: Secondary | ICD-10-CM

## 2019-06-21 ENCOUNTER — Telehealth (HOSPITAL_COMMUNITY): Payer: Self-pay | Admitting: *Deleted

## 2019-06-21 NOTE — Telephone Encounter (Signed)

## 2019-06-24 ENCOUNTER — Encounter: Payer: Self-pay | Admitting: *Deleted

## 2019-06-24 ENCOUNTER — Ambulatory Visit (HOSPITAL_COMMUNITY)
Admission: RE | Admit: 2019-06-24 | Discharge: 2019-06-24 | Disposition: A | Discharge status: Home / Self Care | Payer: BC Managed Care – PPO | Source: Ambulatory Visit | Attending: Family | Admitting: Family

## 2019-06-24 ENCOUNTER — Other Ambulatory Visit: Payer: Self-pay

## 2019-06-24 ENCOUNTER — Encounter: Payer: Self-pay | Admitting: Surgery

## 2019-06-24 ENCOUNTER — Ambulatory Visit: Payer: BC Managed Care – PPO | Admitting: Surgery

## 2019-06-24 VITALS — BP 141/78 | HR 74 | Temp 97.9°F | Resp 20 | Ht 66.0 in | Wt 181.3 lb

## 2019-06-24 DIAGNOSIS — I83893 Varicose veins of bilateral lower extremities with other complications: Secondary | ICD-10-CM | POA: Insufficient documentation

## 2019-06-24 NOTE — Progress Notes (Signed)
[facility] [place]  Patient name: Debra Buchanan MRN: [id number] DOB: Sep 26, 1955 Sex: female   REQUESTING PROVIDER:    Self referral   REASON FOR CONSULT:    Varicose veins  HISTORY OF PRESENT ILLNESS:   Debra Buchanan is a 63 y.o. female, who is self-referred for evaluation of varicose veins.  She does not have any significant leg swelling.  She will occasionally get some pain and cramping at night.  She does not have any open wounds.  She denies any history of DVT.  Patient suffers from bipolar disorder and she is a non-smoker.  Anxiety  PAST MEDICAL HISTORY    Past Medical History:  Diagnosis Date  . Allergy   . Anemia   . Anxiety   . Bipolar disorder ([place])      FAMILY HISTORY   Family History  Problem Relation Age of Onset  . Breast cancer Mother   . Emphysema Father   . Heart attack Paternal Grandmother   . Colon cancer Neg Hx   . Esophageal cancer Neg Hx   . Stomach cancer Neg Hx     SOCIAL HISTORY:   Social History   Socioeconomic History  . Marital status: Divorced    Spouse name: Not on file  . Number of children: 2  . Years of education: Not on file  . Highest education level: Not on file  Occupational History  . Occupation: Fish farm manager: [employer]  Social Needs  . Financial resource strain: Not on file  . Food insecurity    Worry: Not on file    Inability: Not on file  . Transportation needs    Medical: Not on file    Non-medical: Not on file  Tobacco Use  . Smoking status: Never Smoker  . Smokeless tobacco: Never Used  Substance and Sexual Activity  . Alcohol use: Yes    Alcohol/week: 2.0 standard drinks    Types: 2 drink(s) per week    Comment: occasional  . Drug use: No  . Sexual activity: Not on file  Lifestyle  . Physical activity    Days per week: Not on file    Minutes per session: Not on file  . Stress: Not on file   Relationships  . Social Herbalist on phone: Not on file    Gets together: Not on file    Attends religious service: Not on file    Active member of club or organization: Not on file    Attends meetings of clubs or organizations: Not on file    Relationship status: Not on file  . Intimate partner violence    Fear of current or ex partner: Not on file    Emotionally abused: Not on file    Physically abused: Not on file    Forced sexual activity: Not on file  Other Topics Concern  . Not on file  Social History Narrative   Lives at home alone   Right-handed   Rare caffeine use    ALLERGIES:    Allergies  Allergen Reactions  . Iodine Swelling  . Penicillins     Unsure of reaction    CURRENT MEDICATIONS:    Current Outpatient Medications  Medication Sig Dispense Refill  . amoxicillin (AMOXIL) 875 MG tablet Take 875 mg by mouth 2 (two) times daily.    . clonazePAM (KLONOPIN) 0.5 MG tablet Take 1 tablet (0.5 mg total)  by mouth 2 (two) times daily. (Patient not taking: Reported on 03/31/2018) 60 tablet 3  . diclofenac (VOLTAREN) 75 MG EC tablet Take 1 tablet (75 mg total) by mouth 2 (two) times daily with a meal. (Patient not taking: Reported on 03/31/2018) 60 tablet 2  . gabapentin (NEURONTIN) 300 MG capsule Take 1 capsule (300 mg total) by mouth 3 (three) times daily. (Patient not taking: Reported on 03/31/2018) 90 capsule 11  . HYDROcodone-acetaminophen (NORCO/VICODIN) 5-325 MG tablet One tablet every four hours as needed for acute pain.  Limit of five days per [place]. 30 tablet 0  . HYDROcodone-Chlorpheniramine 5-4 MG/5ML SOLN Take by mouth.    . lamoTRIgine (LAMICTAL) 200 MG tablet Take 200 mg by mouth daily.      . meloxicam (MOBIC) 7.5 MG tablet Take 1 tablet (7.5 mg total) by mouth daily. 15 tablet 0  . predniSONE (STERAPRED UNI-PAK 21 TAB) 10 MG (21) TBPK tablet Take six pills the first day;5 pills the next day;4 pills the next day;3 pills the next day; 2  pills the next day,one the final day. 21 tablet 1  . QUEtiapine Fumarate (SEROQUEL PO) Take by mouth.     No current facility-administered medications for this visit.     REVIEW OF SYSTEMS:   [X]  denotes positive finding, [ ]  denotes negative finding Cardiac  Comments:  Chest pain or chest pressure:    Shortness of breath upon exertion:    Short of breath when lying flat:    Irregular heart rhythm:        Vascular    Pain in calf, thigh, or hip brought on by ambulation:    Pain in feet at night that wakes you up from your sleep:     Blood clot in your veins:    Leg swelling:         Pulmonary    Oxygen at home:    Productive cough:     Wheezing:         Neurologic    Sudden weakness in arms or legs:     Sudden numbness in arms or legs:     Sudden onset of difficulty speaking or slurred speech:    Temporary loss of vision in one eye:     Problems with dizziness:         Gastrointestinal    Blood in stool:      Vomited blood:         Genitourinary    Burning when urinating:     Blood in urine:        Psychiatric    Major depression:         Hematologic    Bleeding problems:    Problems with blood clotting too easily:        Skin    Rashes or ulcers:        Constitutional    Fever or chills:     PHYSICAL EXAM:   There were no vitals filed for this visit.  GENERAL: The patient is a well-nourished female, in no acute distress. The vital signs are documented above. CARDIAC: There is a regular rate and rhythm.  VASCULAR: Palpable pedal pulses.  Multiple spider veins bilaterally. PULMONARY: Nonlabored respirations ABDOMEN: Soft and non-tender with normal pitched bowel sounds.  MUSCULOSKELETAL: There are no major deformities or cyanosis. NEUROLOGIC: No focal weakness or paresthesias are detected. SKIN: There are no ulcers or rashes noted. PSYCHIATRIC: The patient has a normal affect.  STUDIES:   I have reviewed the following studies:    Venous  Reflux Times Normal value < 0.5 sec +---------+----------+---------+          Right (ms)Left (ms) +---------+----------+---------+ CFV      1416.00   1614.00   +---------+----------+---------+ Popliteal2494.00             +---------+----------+---------+ SSV prox 4760.00             +---------+----------+---------+  +------------------------------+----------+---------+ VEIN DIAMETERS:               Right (cm)Left (cm) +------------------------------+----------+---------+ GSV at Saphenofemoral junction0.53      0.62      +------------------------------+----------+---------+ GSV at prox thigh             0.54      0.25      +------------------------------+----------+---------+ GSV at mid thigh              0.44      0.23      +------------------------------+----------+---------+ GSV at distal thigh           0.39      0.27      +------------------------------+----------+---------+ GSV at knee                   0.43      0.19      +------------------------------+----------+---------+ GSV prox calf                 0.34      0.18      +------------------------------+----------+---------+ GSV mid calf                  0.25      0.13      +------------------------------+----------+---------+ GSV dist calf                                     +------------------------------+----------+---------+ SSV origin                    0.16      too small +------------------------------+----------+---------+ SSV prox                      0.15      too small +------------------------------+----------+---------+ SSV mid                       0.26      0.20      +------------------------------+----------+---------+       Summary: Right: Abnormal reflux times were noted in the common femoral vein, popliteal vein, and proximal small saphenous vein. No DVT from the common femoral vein to the popliteal vein. Left: Abnormal reflux times  were noted in the common femoral vein. No DVT from the common femoral vein to the popliteal vein. ASSESSMENT and PLAN   Chronic venous insufficiency: The patient has minimal edema.  Ultrasound does not show any hemodynamically significant superficial venous reflux.  There is some deep vein reflux.  However, she is not having any leg swelling at this time therefore I do not think compression stockings are warranted.  We did discuss that if she develops leg swelling she should wear compression stockings and have a repeat ultrasound performed.  The patient is concerned about her spider veins.  She spoke with Lattie Haw about possibly getting these injected.   Leia Alf, MD, FACS [facility] [phone] Pager [phone]

## 2019-07-01 ENCOUNTER — Telehealth: Payer: Self-pay

## 2019-07-01 NOTE — Telephone Encounter (Signed)
Called pt to schedule her sclerotherapy appt. She found out that [employer] plan is going to start covering sclero next year. At this time pt wants to wait and proceed in January. She is going to call her insurance to verify and will call us in a few months when she is ready to schedule.

## 2019-07-02 ENCOUNTER — Ambulatory Visit: Payer: BC Managed Care – PPO | Admitting: Licensed Clinical Social Worker

## 2019-07-11 ENCOUNTER — Ambulatory Visit (INDEPENDENT_AMBULATORY_CARE_PROVIDER_SITE_OTHER): Payer: BC Managed Care – PPO | Admitting: Adult Health

## 2019-07-11 ENCOUNTER — Other Ambulatory Visit: Payer: Self-pay

## 2019-07-11 ENCOUNTER — Encounter: Payer: Self-pay | Admitting: Adult Health

## 2019-07-11 DIAGNOSIS — F411 Generalized anxiety disorder: Secondary | ICD-10-CM | POA: Diagnosis not present

## 2019-07-11 DIAGNOSIS — F331 Major depressive disorder, recurrent, moderate: Secondary | ICD-10-CM

## 2019-07-11 DIAGNOSIS — G4700 Insomnia, unspecified: Secondary | ICD-10-CM

## 2019-07-11 NOTE — Progress Notes (Signed)
Debra Buchanan [id number] 08-Jan-1956 63 y.o.  Subjective:   Patient ID:  Debra Buchanan is a 64 y.o. (DOB 11/10/1955) female.  Chief Complaint: No chief complaint on file.   HPI Debra Buchanan presents to the office today for follow-up of anxiety, depression, and insomnia.  Describes mood today as "ok". Pleasant. Mood symptoms - reports depression, anxiety, and irritability - "some days are better than others". Stating "all things considered, I can't complain".  Going to work everyday - "sitting in office alone all day". Has taken FMLA days - 3 times. Stating "I'm tired of being at home" - "tired of being by myself".  Recently saw PCP - diagnosed with TD from "all the antipsychotics she's taken over the years". Wanting to discuss Ingrezza for treatment of symptoms - "all in my shoulders". Stable interest and motivation. Taking medications as prescribed.  Energy levels stable. Active, but does not have a regular exercise routine. Works as a Animal nutritionist - mostly onsite. Enjoys some usual interests and activities. Lives alone. Mostly staying at home. Talking with family and friends. Appetite adequate. Weight gain - 10 pounds. Sleeps better some nights than others. Averages 7 hours. Focus and concentration stable. Completing tasks. Managing aspects of household.  Denies SI or HI. Denies AH or VH.  Review of Systems:  Review of Systems  Musculoskeletal: Negative for gait problem.  Neurological: Negative for tremors.  Psychiatric/Behavioral:       Please refer to HPI   Medications: I have reviewed the patient's current medications.  Current Outpatient Medications  Medication Sig Dispense Refill  . Cholecalciferol 1.25 MG (50000 UT) TABS SMARTSIG:1 Tablet(s) By Mouth Once a Week    . clonazePAM (KLONOPIN) 0.5 MG tablet Take 1 tablet (0.5 mg total) by mouth 2 (two) times daily. 60 tablet 3  . fluticasone (FLONASE) 50 MCG/ACT nasal spray fluticasone propionate 50 mcg/actuation  nasal spray,suspension    . lamoTRIgine (LAMICTAL) 200 MG tablet Take 200 mg by mouth daily.      . QUEtiapine Fumarate (SEROQUEL PO) Take by mouth.     No current facility-administered medications for this visit.     Medication Side Effects: None  Allergies:  Allergies  Allergen Reactions  . Grass Extracts  [Gramineae Pollens] Cough, Hives, Itching, Shortness Of Breath and Swelling  . Shellfish Allergy Cough, Hives, Itching, Rash, Shortness Of Breath and Swelling  . Iodine Swelling  . Penicillins     Unsure of reaction    Past Medical History:  Diagnosis Date  . Allergy   . Anemia   . Anxiety   . Bipolar disorder ([place])     Family History  Problem Relation Age of Onset  . Breast cancer Mother   . Emphysema Father   . Heart attack Paternal Grandmother   . Colon cancer Neg Hx   . Esophageal cancer Neg Hx   . Stomach cancer Neg Hx     Social History   Socioeconomic History  . Marital status: Divorced    Spouse name: Not on file  . Number of children: 2  . Years of education: Not on file  . Highest education level: Not on file  Occupational History  . Occupation: Fish farm manager: [employer]  Social Needs  . Financial resource strain: Not on file  . Food insecurity    Worry: Not on file    Inability: Not on file  . Transportation needs    Medical: Not on file  Non-medical: Not on file  Tobacco Use  . Smoking status: Never Smoker  . Smokeless tobacco: Never Used  Substance and Sexual Activity  . Alcohol use: Yes    Alcohol/week: 2.0 standard drinks    Types: 2 drink(s) per week    Comment: occasional  . Drug use: No  . Sexual activity: Not on file  Lifestyle  . Physical activity    Days per week: Not on file    Minutes per session: Not on file  . Stress: Not on file  Relationships  . Social Herbalist on phone: Not on file    Gets together: Not on file    Attends religious service: Not on file    Active  member of club or organization: Not on file    Attends meetings of clubs or organizations: Not on file    Relationship status: Not on file  . Intimate partner violence    Fear of current or ex partner: Not on file    Emotionally abused: Not on file    Physically abused: Not on file    Forced sexual activity: Not on file  Other Topics Concern  . Not on file  Social History Narrative   Lives at home alone   Right-handed   Rare caffeine use    Past Medical History, Surgical history, Social history, and Family history were reviewed and updated as appropriate.   Please see review of systems for further details on the patient's review from today.   Objective:   Physical Exam:  There were no vitals taken for this visit.  Physical Exam Musculoskeletal:        General: No deformity.  Neurological:     Mental Status: She is alert and oriented to person, place, and time.     Coordination: Coordination normal.  Psychiatric:        Attention and Perception: Attention and perception normal. She does not perceive auditory or visual hallucinations.        Mood and Affect: Mood normal. Mood is not anxious or depressed. Affect is not labile, blunt, angry or inappropriate.        Speech: Speech normal.        Behavior: Behavior normal.        Thought Content: Thought content normal. Thought content is not paranoid or delusional. Thought content does not include homicidal or suicidal ideation. Thought content does not include homicidal or suicidal plan.        Cognition and Memory: Cognition and memory normal.        Judgment: Judgment normal.     Comments: Insight intact     Lab Review:     Component Value Date/Time   NA 138 09/29/2010 2228   K 3.6 09/29/2010 2228   CL 105 09/29/2010 2228   CO2 26 09/29/2010 2228   GLUCOSE 120 (H) 09/29/2010 2228   BUN 12 09/29/2010 2228   CREATININE 0.87 09/29/2010 2228   CALCIUM 9.3 09/29/2010 2228   GFRNONAA >60 09/29/2010 2228   GFRAA   09/29/2010 2228    >60        The eGFR has been calculated using the MDRD equation. This calculation has not been validated in all clinical situations. eGFR's persistently <60 mL/min signify possible Chronic Kidney Disease.       Component Value Date/Time   WBC 5.8 01/26/2013 1254   WBC 7.4 09/29/2010 2228   RBC 3.76 (A) 01/26/2013 1254   RBC 3.86 (L)  09/29/2010 2228   HGB 11.1 (A) 01/26/2013 1254   HGB 11.7 (L) 09/29/2010 2228   HCT 34.9 (A) 01/26/2013 1254   HCT 34.2 (L) 09/29/2010 2228   PLT 282 09/29/2010 2228   MCV 92.9 01/26/2013 1254   MCH 29.5 01/26/2013 1254   MCH 30.3 09/29/2010 2228   MCHC 31.8 01/26/2013 1254   MCHC 34.2 09/29/2010 2228   RDW 12.4 09/29/2010 2228   LYMPHSABS 2.6 09/29/2010 2228   MONOABS 0.7 09/29/2010 2228   EOSABS 0.3 09/29/2010 2228   BASOSABS 0.0 09/29/2010 2228    No results found for: POCLITH, LITHIUM   No results found for: PHENYTOIN, PHENOBARB, VALPROATE, CBMZ   .res Assessment: Plan:    Plan:  1. Has talked to PCP about TD - wanting to discuss Ingrezza - shoulder and neck. Patient to review Ingrezza and videos. Will contact office for further instructions.  2. Lamictal 224m daily 3. Seroquel 278m- 3 to 4 at bedtime 4. Clonazepam 0.25m53mID - per PCP.   RTC 3 months  Patient advised to contact office with any questions, adverse effects, or acute worsening in signs and symptoms.  Discussed potential benefits, risk, and side effects of benzodiazepines to include potential risk of tolerance and dependence, as well as possible drowsiness.  Advised patient not to drive if experiencing drowsiness and to take lowest possible effective dose to minimize risk of dependence and tolerance.  Discussed potential metabolic side effects associated with atypical antipsychotics, as well as potential risk for movement side effects. Advised pt to contact office if movement side effects occur.   There are no diagnoses linked to this encounter.    Please see After Visit Summary for patient specific instructions.  Future Appointments  Date Time Provider DepHarkers Island1/17/2020  4:00 PM Whitt, JulJunita Push[facility]outh [facility] None    No orders of the defined types were placed in this encounter.   -------------------------------

## 2019-07-15 ENCOUNTER — Other Ambulatory Visit: Payer: Self-pay | Admitting: Cardiology

## 2019-07-15 DIAGNOSIS — Z20822 Contact with and (suspected) exposure to covid-19: Secondary | ICD-10-CM

## 2019-07-16 ENCOUNTER — Ambulatory Visit (INDEPENDENT_AMBULATORY_CARE_PROVIDER_SITE_OTHER): Payer: BC Managed Care – PPO | Admitting: Licensed Clinical Social Worker

## 2019-07-16 DIAGNOSIS — F315 Bipolar disorder, current episode depressed, severe, with psychotic features: Secondary | ICD-10-CM

## 2019-07-17 LAB — NOVEL CORONAVIRUS, NAA: SARS-CoV-2, NAA: NOT DETECTED

## 2019-07-24 ENCOUNTER — Ambulatory Visit (INDEPENDENT_AMBULATORY_CARE_PROVIDER_SITE_OTHER): Payer: BC Managed Care – PPO | Admitting: Licensed Clinical Social Worker

## 2019-07-24 DIAGNOSIS — F315 Bipolar disorder, current episode depressed, severe, with psychotic features: Secondary | ICD-10-CM

## 2019-07-30 ENCOUNTER — Ambulatory Visit: Payer: BC Managed Care – PPO | Admitting: Licensed Clinical Social Worker

## 2019-07-31 ENCOUNTER — Other Ambulatory Visit: Payer: Self-pay

## 2019-07-31 ENCOUNTER — Ambulatory Visit (INDEPENDENT_AMBULATORY_CARE_PROVIDER_SITE_OTHER): Payer: BC Managed Care – PPO | Admitting: Licensed Clinical Social Worker

## 2019-07-31 DIAGNOSIS — Z20822 Contact with and (suspected) exposure to covid-19: Secondary | ICD-10-CM

## 2019-07-31 DIAGNOSIS — F315 Bipolar disorder, current episode depressed, severe, with psychotic features: Secondary | ICD-10-CM

## 2019-08-03 LAB — NOVEL CORONAVIRUS, NAA: SARS-CoV-2, NAA: NOT DETECTED

## 2019-08-05 ENCOUNTER — Encounter: Payer: Self-pay | Admitting: Adult Health

## 2019-08-05 ENCOUNTER — Ambulatory Visit (INDEPENDENT_AMBULATORY_CARE_PROVIDER_SITE_OTHER): Payer: BC Managed Care – PPO | Admitting: Adult Health

## 2019-08-05 ENCOUNTER — Other Ambulatory Visit: Payer: Self-pay

## 2019-08-05 DIAGNOSIS — G4700 Insomnia, unspecified: Secondary | ICD-10-CM

## 2019-08-05 DIAGNOSIS — F411 Generalized anxiety disorder: Secondary | ICD-10-CM | POA: Diagnosis not present

## 2019-08-05 DIAGNOSIS — F4323 Adjustment disorder with mixed anxiety and depressed mood: Secondary | ICD-10-CM

## 2019-08-05 DIAGNOSIS — F331 Major depressive disorder, recurrent, moderate: Secondary | ICD-10-CM | POA: Diagnosis not present

## 2019-08-05 MED ORDER — LAMOTRIGINE 25 MG PO TABS: ORAL_TABLET | ORAL | 2 refills | Status: DC

## 2019-08-05 NOTE — Progress Notes (Signed)
Debra Buchanan [id number] [birth year] 63 y.o.   Virtual Visit via Telephone Note  I connected with pt on 12/07//20 at  9:40 PM EDT by telephone and verified that I am speaking with the correct person using two identifiers.   I discussed the limitations, risks, security and privacy concerns of performing an evaluation and management service by telephone and the availability of in person appointments. I also discussed with the patient that there may be a patient responsible charge related to this service. The patient expressed understanding and agreed to proceed.   I discussed the assessment and treatment plan with the patient. The patient was provided an opportunity to ask questions and all were answered. The patient agreed with the plan and demonstrated an understanding of the instructions.   The patient was advised to call back or seek an in-person evaluation if the symptoms worsen or if the condition fails to improve as anticipated.  I provided 30 minutes of non-face-to-face time during this encounter.  The patient was located at home.  The provider was located at [place].  Subjective:   Patient ID:  Debra Buchanan is a 63 y.o. (DOB [birth year]) female.  Chief Complaint: No chief complaint on file.   HPI Debra Buchanan presents to the office today for follow-up of anxiety, depression, and insomnia.  Describes mood today as "not good". Pleasant. Mood symptoms - reports depression, anxiety, and irritability. Having mostly "bad days" over the past 3 weeks. Wakes up every morning "crying". Stating "some days are better than others". Tearful "for no reason". Trying to "hold it together" at work. Gets "set off" easily. Had thoughts of "tearing her house up with a golf club". Has increased visits with therapist.  Can't make decisions about things. Not seeing family - "sad" at Christmas. Stable interest and motivation. Taking medications as prescribed.  Energy levels low.  Active, but does not have a regular exercise routine. 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 Lives alone. Talking with her girls. Doing Zoom calls - one in [place] and other daughter in [place]). Mostly staying at home. Talking with family and friends on the phone. Appetite adequate. Weight gain - 10 to 12 pounds since March. Sleeps better some nights than others. Averages 6 to 7 hours - last few weeks waking up every few hours. Taking a while to get back to sleep.  Focus and concentration stable. Completing tasks. Managing aspects of household.  Denies SI or HI. Denies AH or VH.  Review of Systems:  Review of Systems  Musculoskeletal: Negative for gait problem.  Neurological: Negative for tremors.  Psychiatric/Behavioral: Depressed, Insomnia, Anxious, Irritable      Please refer to HPI   Medications: I have reviewed the patient's current medications.  Current Outpatient Medications  Medication Sig Dispense Refill  . Cholecalciferol 1.25 MG (50000 UT) TABS SMARTSIG:1 Tablet(s) By Mouth Once a Week    . clonazePAM (KLONOPIN) 0.5 MG tablet Take 1 tablet (0.5 mg total) by mouth 2 (two) times daily. 60 tablet 3  . fluticasone (FLONASE) 50 MCG/ACT nasal spray fluticasone propionate 50 mcg/actuation nasal spray,suspension    . lamoTRIgine (LAMICTAL) 200 MG tablet Take 200 mg by mouth daily.      Marland Kitchen lamoTRIgine (LAMICTAL) 25 MG tablet Take one tablet every morning x 7 days, then take two tablets every morning. 60 tablet 2  . QUEtiapine Fumarate (SEROQUEL PO) Take by mouth.     No current facility-administered medications for this visit.  Medication Side Effects: None  Allergies:  Allergies  Allergen Reactions  . Grass Extracts  [Gramineae Pollens] Cough, Hives, Itching, Shortness Of Breath and Swelling  . Shellfish Allergy Cough, Hives, Itching, Rash, Shortness Of Breath and Swelling  . Iodine Swelling  . Penicillins     Unsure of reaction    Past  Medical History:  Diagnosis Date  . Allergy   . Anemia   . Anxiety   . Bipolar disorder ([place])     Family History  Problem Relation Age of Onset  . Breast cancer Mother   . Emphysema Father   . Heart attack Paternal Grandmother   . Colon cancer Neg Hx   . Esophageal cancer Neg Hx   . Stomach cancer Neg Hx     Social History   Socioeconomic History  . Marital status: Divorced    Spouse name: Not on file  . Number of children: 2  . Years of education: Not on file  . Highest education level: Not on file  Occupational History  . Occupation: Fish farm manager: [employer]  Social Needs  . Financial resource strain: Not on file  . Food insecurity    Worry: Not on file    Inability: Not on file  . Transportation needs    Medical: Not on file    Non-medical: Not on file  Tobacco Use  . Smoking status: Never Smoker  . Smokeless tobacco: Never Used  Substance and Sexual Activity  . Alcohol use: Yes    Alcohol/week: 2.0 standard drinks    Types: 2 drink(s) per week    Comment: occasional  . Drug use: No  . Sexual activity: Not on file  Lifestyle  . Physical activity    Days per week: Not on file    Minutes per session: Not on file  . Stress: Not on file  Relationships  . Social Herbalist on phone: Not on file    Gets together: Not on file    Attends religious service: Not on file    Active member of club or organization: Not on file    Attends meetings of clubs or organizations: Not on file    Relationship status: Not on file  . Intimate partner violence    Fear of current or ex partner: Not on file    Emotionally abused: Not on file    Physically abused: Not on file    Forced sexual activity: Not on file  Other Topics Concern  . Not on file  Social History Narrative   Lives at home alone   Right-handed   Rare caffeine use    Past Medical History, Surgical history, Social history, and Family history were reviewed and  updated as appropriate.   Please see review of systems for further details on the patient's review from today.   Objective:   Physical Exam:  There were no vitals taken for this visit.  Physical Exam Musculoskeletal:        General: No deformity.  Neurological:     Mental Status: She is alert and oriented to person, place, and time.     Coordination: Coordination normal.  Psychiatric:        Attention and Perception: Attention and perception normal. She does not perceive auditory or visual hallucinations.        Mood and Affect: Mood normal. Mood is anxious, irritable, depressed. Affect is not labile, blunt, angry or inappropriate.  Speech: Speech normal.        Behavior: Behavior normal.        Thought Content: Thought content normal. Thought content is not paranoid or delusional. Thought content does not include homicidal or suicidal ideation. Thought content does not include homicidal or suicidal plan.        Cognition and Memory: Cognition and memory normal.        Judgment: Judgment normal.     Comments: Insight intact     Lab Review:     Component Value Date/Time   NA 138 09/29/2010 2228   K 3.6 09/29/2010 2228   CL 105 09/29/2010 2228   CO2 26 09/29/2010 2228   GLUCOSE 120 (H) 09/29/2010 2228   BUN 12 09/29/2010 2228   CREATININE 0.87 09/29/2010 2228   CALCIUM 9.3 09/29/2010 2228   GFRNONAA >60 09/29/2010 2228   GFRAA  09/29/2010 2228    >60        The eGFR has been calculated using the MDRD equation. This calculation has not been validated in all clinical situations. eGFR's persistently <60 mL/min signify possible Chronic Kidney Disease.       Component Value Date/Time   WBC 5.8 01/26/2013 1254   WBC 7.4 09/29/2010 2228   RBC 3.76 (A) 01/26/2013 1254   RBC 3.86 (L) 09/29/2010 2228   HGB 11.1 (A) 01/26/2013 1254   HGB 11.7 (L) 09/29/2010 2228   HCT 34.9 (A) 01/26/2013 1254   HCT 34.2 (L) 09/29/2010 2228   PLT 282 09/29/2010 2228   MCV 92.9  01/26/2013 1254   MCH 29.5 01/26/2013 1254   MCH 30.3 09/29/2010 2228   MCHC 31.8 01/26/2013 1254   MCHC 34.2 09/29/2010 2228   RDW 12.4 09/29/2010 2228   LYMPHSABS 2.6 09/29/2010 2228   MONOABS 0.7 09/29/2010 2228   EOSABS 0.3 09/29/2010 2228   BASOSABS 0.0 09/29/2010 2228    No results found for: POCLITH, LITHIUM   No results found for: PHENYTOIN, PHENOBARB, VALPROATE, CBMZ   .res Assessment: Plan:    Plan:  1. PCP started 48m Ingrezza - shoulder and neck. Plans to increase to 861mdaily. 2. Lamictal 20013maily 3. Seroquel 54m16m3 to 4 at bedtime. Has been taking 3 - plans to start taking 4 at bedtime 4. Clonazepam 0.5mg 71m - per PCP.  5. Add Lamictal 54mg 70mhe morning x 7 days, then 50mg e23m morning.  RTC 4 weeks  Patient advised to contact office with any questions, adverse effects, or acute worsening in signs and symptoms.  Discussed potential benefits, risk, and side effects of benzodiazepines to include potential risk of tolerance and dependence, as well as possible drowsiness.  Advised patient not to drive if experiencing drowsiness and to take lowest possible effective dose to minimize risk of dependence and tolerance.  Discussed potential metabolic side effects associated with atypical antipsychotics, as well as potential risk for movement side effects. Advised pt to contact office if movement side effects occur.   Diagnoses and all orders for this visit:  Adjustment disorder with mixed anxiety and depressed mood -     lamoTRIgine (LAMICTAL) 25 MG tablet; Take one tablet every morning x 7 days, then take two tablets every morning.  Generalized anxiety disorder -     lamoTRIgine (LAMICTAL) 25 MG tablet; Take one tablet every morning x 7 days, then take two tablets every morning.  Major depressive disorder, recurrent episode, moderate (HCC) -     lamoTRIgine (LAMICTAL) 25 MG tablet;  Take one tablet every morning x 7 days, then take two tablets every  morning.  Insomnia, unspecified type     Please see After Visit Summary for patient specific instructions.  Future Appointments  Date Time Provider [place]  08/13/2019  4:00 PM [place], Junita Push, [place] [facility] None

## 2019-08-13 ENCOUNTER — Ambulatory Visit (INDEPENDENT_AMBULATORY_CARE_PROVIDER_SITE_OTHER): Payer: BC Managed Care – PPO | Admitting: Licensed Clinical Social Worker

## 2019-08-13 DIAGNOSIS — F315 Bipolar disorder, current episode depressed, severe, with psychotic features: Secondary | ICD-10-CM

## 2019-08-20 ENCOUNTER — Ambulatory Visit: Payer: BC Managed Care – PPO | Admitting: Licensed Clinical Social Worker

## 2019-08-27 ENCOUNTER — Other Ambulatory Visit: Payer: Self-pay

## 2019-08-27 ENCOUNTER — Ambulatory Visit: Payer: BC Managed Care – PPO | Attending: Internal Medicine

## 2019-08-27 DIAGNOSIS — Z20822 Contact with and (suspected) exposure to covid-19: Secondary | ICD-10-CM

## 2019-08-29 LAB — NOVEL CORONAVIRUS, NAA: SARS-CoV-2, NAA: NOT DETECTED

## 2019-09-03 ENCOUNTER — Encounter: Payer: Self-pay | Admitting: Adult Health

## 2019-09-03 ENCOUNTER — Ambulatory Visit (INDEPENDENT_AMBULATORY_CARE_PROVIDER_SITE_OTHER): Payer: BC Managed Care – PPO | Admitting: Adult Health

## 2019-09-03 ENCOUNTER — Ambulatory Visit: Payer: BC Managed Care – PPO | Admitting: Adult Health

## 2019-09-03 DIAGNOSIS — F331 Major depressive disorder, recurrent, moderate: Secondary | ICD-10-CM | POA: Diagnosis not present

## 2019-09-03 DIAGNOSIS — F4323 Adjustment disorder with mixed anxiety and depressed mood: Secondary | ICD-10-CM | POA: Diagnosis not present

## 2019-09-03 DIAGNOSIS — F411 Generalized anxiety disorder: Secondary | ICD-10-CM

## 2019-09-03 DIAGNOSIS — G4700 Insomnia, unspecified: Secondary | ICD-10-CM

## 2019-09-03 NOTE — Progress Notes (Signed)
Debra Buchanan [id number] 08-08-1956 64 y.o.  Virtual Visit via Telephone Note  I connected with pt on 09/03/19 at 11:00 AM EST by telephone and verified that I am speaking with the correct person using two identifiers.   I discussed the limitations, risks, security and privacy concerns of performing an evaluation and management service by telephone and the availability of in person appointments. I also discussed with the patient that there may be a patient responsible charge related to this service. The patient expressed understanding and agreed to proceed.   I discussed the assessment and treatment plan with the patient. The patient was provided an opportunity to ask questions and all were answered. The patient agreed with the plan and demonstrated an understanding of the instructions.   The patient was advised to call back or seek an in-person evaluation if the symptoms worsen or if the condition fails to improve as anticipated.  I provided 30 minutes of non-face-to-face time during this encounter.  The patient was located at home.  The provider was located at [place].   Aloha Gell, NP   Subjective:   Patient ID:  Debra Buchanan is a 64 y.o. (DOB 1956-07-08) female.  Chief Complaint: No chief complaint on file.  HPI   Debra Buchanan presents for follow-up of adjustment issues, cognitive issues, anxiety, depression, and insomnia.  Describes mood today as "about the same". Pleasant. Mood symptoms - reports depression, anxiety, and irritability.  Had a day of "mania" on Decemeber 19, 2020. Stated "I was wide open that day". The rest of the time has felt down. Drinking wine every night - 4 to 5 glasses. Stating "I'm doing it out of boredom". Doesn't drink as much when she is with someone. Weight going up. Tearful - having crying spells. Does better when she's at work. Brother in Sports coach passed away. Was able to spend holidays with 2 daughters. Has been very  forgetful "lately". Having to ask questions of coworkers. Having to write down things. Stopping and starting bible lessons. Stating "I can forgets things in a second". Denies agitation - "no longer wanting to tear up things". Stable interest and motivation. Taking medications as prescribed.  Energy levels low. Active, but does not have a regular exercise routine. 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 Single. Lives alone. Talking with 2 daughters. Talking with family and friends on the phone. Appetite adequate. Weight gain.  Sleeps better some nights than others. Averages 6 to 8 hours. Waking up during the night some nights.  Focus and concentration difficulties "all the time". Completing tasks. Managing aspects of household. Work going well.  Denies SI or HI. Denies AH or VH.  Review of Systems:  Review of Systems  Musculoskeletal: Negative for gait problem.  Neurological: Negative for tremors and syncope.  Psychiatric/Behavioral:       Please refer to HPI    Medications: I have reviewed the patient's current medications.  Current Outpatient Medications  Medication Sig Dispense Refill  . Cholecalciferol 1.25 MG (50000 UT) TABS SMARTSIG:1 Tablet(s) By Mouth Once a Week    . clonazePAM (KLONOPIN) 0.5 MG tablet Take 1 tablet (0.5 mg total) by mouth 2 (two) times daily. 60 tablet 3  . fluticasone (FLONASE) 50 MCG/ACT nasal spray fluticasone propionate 50 mcg/actuation nasal spray,suspension    . lamoTRIgine (LAMICTAL) 200 MG tablet Take 200 mg by mouth daily.      Marland Kitchen lamoTRIgine (LAMICTAL) 25 MG tablet Take one tablet every  morning x 7 days, then take two tablets every morning. 60 tablet 2  . QUEtiapine Fumarate (SEROQUEL PO) Take by mouth.     No current facility-administered medications for this visit.    Medication Side Effects: None  Allergies:  Allergies  Allergen Reactions  . Grass Extracts  [Gramineae Pollens] Cough, Hives, Itching, Shortness Of  Breath and Swelling  . Shellfish Allergy Cough, Hives, Itching, Rash, Shortness Of Breath and Swelling  . Iodine Swelling  . Penicillins     Unsure of reaction    Past Medical History:  Diagnosis Date  . Allergy   . Anemia   . Anxiety   . Bipolar disorder ([place])     Family History  Problem Relation Age of Onset  . Breast cancer Mother   . Emphysema Father   . Heart attack Paternal Grandmother   . Colon cancer Neg Hx   . Esophageal cancer Neg Hx   . Stomach cancer Neg Hx     Social History   Socioeconomic History  . Marital status: Divorced    Spouse name: Not on file  . Number of children: 2  . Years of education: Not on file  . Highest education level: Not on file  Occupational History  . Occupation: Fish farm manager: [employer]  Tobacco Use  . Smoking status: Never Smoker  . Smokeless tobacco: Never Used  Substance and Sexual Activity  . Alcohol use: Yes    Alcohol/week: 2.0 standard drinks    Types: 2 drink(s) per week    Comment: occasional  . Drug use: No  . Sexual activity: Not on file  Other Topics Concern  . Not on file  Social History Narrative   Lives at home alone   Right-handed   Rare caffeine use   Social Determinants of Health   Financial Resource Strain:   . Difficulty of Paying Living Expenses: Not on file  Food Insecurity:   . Worried About Charity fundraiser in the Last Year: Not on file  . Ran Out of Food in the Last Year: Not on file  Transportation Needs:   . Lack of Transportation (Medical): Not on file  . Lack of Transportation (Non-Medical): Not on file  Physical Activity:   . Days of Exercise per Week: Not on file  . Minutes of Exercise per Session: Not on file  Stress:   . Feeling of Stress : Not on file  Social Connections:   . Frequency of Communication with Friends and Family: Not on file  . Frequency of Social Gatherings with Friends and Family: Not on file  . Attends Religious Services:  Not on file  . Active Member of Clubs or Organizations: Not on file  . Attends Archivist Meetings: Not on file  . Marital Status: Not on file  Intimate Partner Violence:   . Fear of Current or Ex-Partner: Not on file  . Emotionally Abused: Not on file  . Physically Abused: Not on file  . Sexually Abused: Not on file    Past Medical History, Surgical history, Social history, and Family history were reviewed and updated as appropriate.   Please see review of systems for further details on the patient's review from today.   Objective:   Physical Exam:  There were no vitals taken for this visit.  Physical Exam Constitutional:      General: She is not in acute distress.    Appearance: She is well-developed.  Musculoskeletal:        General: No deformity.  Neurological:     Mental Status: She is alert and oriented to person, place, and time.     Coordination: Coordination normal.  Psychiatric:        Attention and Perception: Attention and perception normal. She does not perceive auditory or visual hallucinations.        Mood and Affect: Mood is anxious and depressed. Affect is not labile, blunt, angry or inappropriate.        Speech: Speech normal.        Behavior: Behavior normal.        Thought Content: Thought content normal. Thought content is not paranoid or delusional. Thought content does not include homicidal or suicidal ideation. Thought content does not include homicidal or suicidal plan.        Cognition and Memory: Cognition and memory normal.        Judgment: Judgment normal.     Comments: Insight intact     Lab Review:     Component Value Date/Time   NA 138 09/29/2010 2228   K 3.6 09/29/2010 2228   CL 105 09/29/2010 2228   CO2 26 09/29/2010 2228   GLUCOSE 120 (H) 09/29/2010 2228   BUN 12 09/29/2010 2228   CREATININE 0.87 09/29/2010 2228   CALCIUM 9.3 09/29/2010 2228   GFRNONAA >60 09/29/2010 2228   GFRAA  09/29/2010 2228    >60        The  eGFR has been calculated using the MDRD equation. This calculation has not been validated in all clinical situations. eGFR's persistently <60 mL/min signify possible Chronic Kidney Disease.       Component Value Date/Time   WBC 5.8 01/26/2013 1254   WBC 7.4 09/29/2010 2228   RBC 3.76 (A) 01/26/2013 1254   RBC 3.86 (L) 09/29/2010 2228   HGB 11.1 (A) 01/26/2013 1254   HGB 11.7 (L) 09/29/2010 2228   HCT 34.9 (A) 01/26/2013 1254   HCT 34.2 (L) 09/29/2010 2228   PLT 282 09/29/2010 2228   MCV 92.9 01/26/2013 1254   MCH 29.5 01/26/2013 1254   MCH 30.3 09/29/2010 2228   MCHC 31.8 01/26/2013 1254   MCHC 34.2 09/29/2010 2228   RDW 12.4 09/29/2010 2228   LYMPHSABS 2.6 09/29/2010 2228   MONOABS 0.7 09/29/2010 2228   EOSABS 0.3 09/29/2010 2228   BASOSABS 0.0 09/29/2010 2228    No results found for: POCLITH, LITHIUM   No results found for: PHENYTOIN, PHENOBARB, VALPROATE, CBMZ   .res Assessment: Plan:    Plan:  1. PCP started 40m Ingrezza - shoulder and neck. Plans to increase to 861mdaily. 2. Lamictal 20059maily 3. Seroquel 73m58m3 to 4 at bedtime. Has been taking 3 - plans to start taking 4 at bedtime 4. Clonazepam 0.5mg 27m - per PCP.  5. Lamictal 73mg 16m0mg e89m morning.  RTC 4 weeks  Patient advised to contact office with any questions, adverse effects, or acute worsening in signs and symptoms.  Discussed potential benefits, risk, and side effects of benzodiazepines to include potential risk of tolerance and dependence, as well as possible drowsiness.  Advised patient not to drive if experiencing drowsiness and to take lowest possible effective dose to minimize risk of dependence and tolerance.  Counseled patient regarding potential benefits, risks, and side effects of Lamictal to include potential risk of Stevens-Johnson syndrome. Advised patient to stop taking Lamictal and contact office immediately if rash develops and to  seek urgent medical attention if rash is  severe and/or spreading quickly.  Discussed potential metabolic side effects associated with atypical antipsychotics, as well as potential risk for movement side effects. Advised pt to contact office if movement side effects occur.    Diagnoses and all orders for this visit:  Insomnia, unspecified type  Major depressive disorder, recurrent episode, moderate (HCC)  Generalized anxiety disorder  Adjustment disorder with mixed anxiety and depressed mood    Please see After Visit Summary for patient specific instructions.  Future Appointments  Date Time Provider [place]  09/05/2019  4:00 PM [place], Junita Push, LCSW [facility] None    No orders of the defined types were placed in this encounter.     -------------------------------

## 2019-09-05 ENCOUNTER — Ambulatory Visit (INDEPENDENT_AMBULATORY_CARE_PROVIDER_SITE_OTHER): Payer: BC Managed Care – PPO | Admitting: Licensed Clinical Social Worker

## 2019-09-05 DIAGNOSIS — F315 Bipolar disorder, current episode depressed, severe, with psychotic features: Secondary | ICD-10-CM

## 2019-09-11 ENCOUNTER — Ambulatory Visit (INDEPENDENT_AMBULATORY_CARE_PROVIDER_SITE_OTHER): Payer: BC Managed Care – PPO | Admitting: Licensed Clinical Social Worker

## 2019-09-11 DIAGNOSIS — F315 Bipolar disorder, current episode depressed, severe, with psychotic features: Secondary | ICD-10-CM

## 2019-09-24 ENCOUNTER — Ambulatory Visit (INDEPENDENT_AMBULATORY_CARE_PROVIDER_SITE_OTHER): Payer: BC Managed Care – PPO | Admitting: Licensed Clinical Social Worker

## 2019-09-24 ENCOUNTER — Other Ambulatory Visit: Payer: Self-pay | Admitting: *Deleted

## 2019-09-24 DIAGNOSIS — F315 Bipolar disorder, current episode depressed, severe, with psychotic features: Secondary | ICD-10-CM | POA: Diagnosis not present

## 2019-09-24 NOTE — Telephone Encounter (Signed)
Opened this encounter in error.

## 2019-10-03 ENCOUNTER — Encounter: Payer: Self-pay | Admitting: Adult Health

## 2019-10-03 ENCOUNTER — Ambulatory Visit (INDEPENDENT_AMBULATORY_CARE_PROVIDER_SITE_OTHER): Payer: BC Managed Care – PPO | Admitting: Adult Health

## 2019-10-03 DIAGNOSIS — G4700 Insomnia, unspecified: Secondary | ICD-10-CM | POA: Diagnosis not present

## 2019-10-03 DIAGNOSIS — F4323 Adjustment disorder with mixed anxiety and depressed mood: Secondary | ICD-10-CM | POA: Diagnosis not present

## 2019-10-03 DIAGNOSIS — F331 Major depressive disorder, recurrent, moderate: Secondary | ICD-10-CM

## 2019-10-03 DIAGNOSIS — F411 Generalized anxiety disorder: Secondary | ICD-10-CM | POA: Diagnosis not present

## 2019-10-03 MED ORDER — LAMOTRIGINE 200 MG PO TABS: 200.0000 mg | ORAL_TABLET | Freq: Every day | ORAL | 3 refills | Status: DC

## 2019-10-03 MED ORDER — QUETIAPINE FUMARATE 50 MG PO TABS: ORAL_TABLET | ORAL | 1 refills | Status: DC

## 2019-10-03 MED ORDER — LAMOTRIGINE 25 MG PO TABS: ORAL_TABLET | ORAL | 3 refills | Status: DC

## 2019-10-03 NOTE — Progress Notes (Signed)
Debra Buchanan [id number] 28-Jul-1956 64 y.o.  Virtual Visit via Telephone Note  I connected with pt on 10/03/19 at 11:00 AM EST by telephone and verified that I am speaking with the correct person using two identifiers.   I discussed the limitations, risks, security and privacy concerns of performing an evaluation and management service by telephone and the availability of in person appointments. I also discussed with the patient that there may be a patient responsible charge related to this service. The patient expressed understanding and agreed to proceed.   I discussed the assessment and treatment plan with the patient. The patient was provided an opportunity to ask questions and all were answered. The patient agreed with the plan and demonstrated an understanding of the instructions.   The patient was advised to call back or seek an in-person evaluation if the symptoms worsen or if the condition fails to improve as anticipated.  I provided 30 minutes of non-face-to-face time during this encounter.  The patient was located at home.  The provider was located at [place].   Aloha Gell, NP   Subjective:   Patient ID:  Debra Buchanan is a 64 y.o. (DOB 03-25-1956) female.  Chief Complaint:  Chief Complaint  Patient presents with  . Depression  . Anxiety  . Insomnia  . Other    BPD1  . Adjustment Disorder   HPI   Debra Buchanan presents for follow-up of adjustment issues, cognitive issues, anxiety, depression, and insomnia.  Describes mood today as "ok". Pleasant. Mood symptoms - reports decreased depression, anxiety, and irritability. Denies "crying spells". Stating "I'm doing pretty good right now". Feels like working from home has been "good" for her. Feels much less stressful. Getting all of her work completed. Decreased alcohol use - "not much at all".  Not feeling as "forgetful". Stable interest and motivation. Taking medications as prescribed.   Energy levels improving. Active, but does not have a regular exercise routine. 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 Single. Lives alone. Talking with 2 daughters. Talking with family and friends on the phone. Appetite adequate. Weight loss - 12 pounds.  Sleeps better some nights than others. Averages 6 to 8 hours. Focus and concentration has improved. Completing tasks. Managing aspects of household. Work going well.  Denies SI or HI. Denies AH or VH.  Review of Systems:  Review of Systems  Musculoskeletal: Negative for gait problem.  Neurological: Negative for tremors and syncope.  Psychiatric/Behavioral:       Please refer to HPI    Medications: I have reviewed the patient's current medications.  Current Outpatient Medications  Medication Sig Dispense Refill  . clonazePAM (KLONOPIN) 0.5 MG tablet Take 1 tablet (0.5 mg total) by mouth 2 (two) times daily. 60 tablet 3  . fluticasone (FLONASE) 50 MCG/ACT nasal spray fluticasone propionate 50 mcg/actuation nasal spray,suspension    . lamoTRIgine (LAMICTAL) 200 MG tablet Take 1 tablet (200 mg total) by mouth daily. 90 tablet 3  . lamoTRIgine (LAMICTAL) 25 MG tablet Take two tablets every morning. 180 tablet 3  . QUEtiapine (SEROQUEL) 50 MG tablet Take one to three tablets at bedtime. 270 tablet 1   No current facility-administered medications for this visit.    Medication Side Effects: None  Allergies:  Allergies  Allergen Reactions  . Grass Extracts  [Gramineae Pollens] Cough, Hives, Itching, Shortness Of Breath and Swelling  . Shellfish Allergy Cough, Hives, Itching, Rash, Shortness Of Breath and Swelling  .  Iodine Swelling  . Penicillins     Unsure of reaction    Past Medical History:  Diagnosis Date  . Allergy   . Anemia   . Anxiety   . Bipolar disorder ([place])     Family History  Problem Relation Age of Onset  . Breast cancer Mother   . Emphysema Father   . Heart attack Paternal  Grandmother   . Colon cancer Neg Hx   . Esophageal cancer Neg Hx   . Stomach cancer Neg Hx     Social History   Socioeconomic History  . Marital status: Divorced    Spouse name: Not on file  . Number of children: 2  . Years of education: Not on file  . Highest education level: Not on file  Occupational History  . Occupation: Fish farm manager: [employer]  Tobacco Use  . Smoking status: Never Smoker  . Smokeless tobacco: Never Used  Substance and Sexual Activity  . Alcohol use: Yes    Alcohol/week: 2.0 standard drinks    Types: 2 drink(s) per week    Comment: occasional  . Drug use: No  . Sexual activity: Not on file  Other Topics Concern  . Not on file  Social History Narrative   Lives at home alone   Right-handed   Rare caffeine use   Social Determinants of Health   Financial Resource Strain:   . Difficulty of Paying Living Expenses: Not on file  Food Insecurity:   . Worried About Charity fundraiser in the Last Year: Not on file  . Ran Out of Food in the Last Year: Not on file  Transportation Needs:   . Lack of Transportation (Medical): Not on file  . Lack of Transportation (Non-Medical): Not on file  Physical Activity:   . Days of Exercise per Week: Not on file  . Minutes of Exercise per Session: Not on file  Stress:   . Feeling of Stress : Not on file  Social Connections:   . Frequency of Communication with Friends and Family: Not on file  . Frequency of Social Gatherings with Friends and Family: Not on file  . Attends Religious Services: Not on file  . Active Member of Clubs or Organizations: Not on file  . Attends Archivist Meetings: Not on file  . Marital Status: Not on file  Intimate Partner Violence:   . Fear of Current or Ex-Partner: Not on file  . Emotionally Abused: Not on file  . Physically Abused: Not on file  . Sexually Abused: Not on file    Past Medical History, Surgical history, Social history, and  Family history were reviewed and updated as appropriate.   Please see review of systems for further details on the patient's review from today.   Objective:   Physical Exam:  There were no vitals taken for this visit.  Physical Exam Constitutional:      General: She is not in acute distress.    Appearance: She is well-developed.  Musculoskeletal:        General: No deformity.  Neurological:     Mental Status: She is alert and oriented to person, place, and time.     Coordination: Coordination normal.  Psychiatric:        Attention and Perception: Attention and perception normal. She does not perceive auditory or visual hallucinations.        Mood and Affect: Mood is anxious and depressed. Affect  is not labile, blunt, angry or inappropriate.        Speech: Speech normal.        Behavior: Behavior normal.        Thought Content: Thought content normal. Thought content is not paranoid or delusional. Thought content does not include homicidal or suicidal ideation. Thought content does not include homicidal or suicidal plan.        Cognition and Memory: Cognition and memory normal.        Judgment: Judgment normal.     Comments: Insight intact     Lab Review:     Component Value Date/Time   NA 138 09/29/2010 2228   K 3.6 09/29/2010 2228   CL 105 09/29/2010 2228   CO2 26 09/29/2010 2228   GLUCOSE 120 (H) 09/29/2010 2228   BUN 12 09/29/2010 2228   CREATININE 0.87 09/29/2010 2228   CALCIUM 9.3 09/29/2010 2228   GFRNONAA >60 09/29/2010 2228   GFRAA  09/29/2010 2228    >60        The eGFR has been calculated using the MDRD equation. This calculation has not been validated in all clinical situations. eGFR's persistently <60 mL/min signify possible Chronic Kidney Disease.       Component Value Date/Time   WBC 5.8 01/26/2013 1254   WBC 7.4 09/29/2010 2228   RBC 3.76 (A) 01/26/2013 1254   RBC 3.86 (L) 09/29/2010 2228   HGB 11.1 (A) 01/26/2013 1254   HGB 11.7 (L)  09/29/2010 2228   HCT 34.9 (A) 01/26/2013 1254   HCT 34.2 (L) 09/29/2010 2228   PLT 282 09/29/2010 2228   MCV 92.9 01/26/2013 1254   MCH 29.5 01/26/2013 1254   MCH 30.3 09/29/2010 2228   MCHC 31.8 01/26/2013 1254   MCHC 34.2 09/29/2010 2228   RDW 12.4 09/29/2010 2228   LYMPHSABS 2.6 09/29/2010 2228   MONOABS 0.7 09/29/2010 2228   EOSABS 0.3 09/29/2010 2228   BASOSABS 0.0 09/29/2010 2228    No results found for: POCLITH, LITHIUM   No results found for: PHENYTOIN, PHENOBARB, VALPROATE, CBMZ   .res Assessment: Plan:    Plan:  1. PCP started 38m Ingrezza - shoulder and neck. Plans to increase to 864mdaily. 2. Lamictal 20059mt hs and 26m25mery morning. 3. Seroquel 25mg33m to 4 at bedtime 4. Clonazepam 0.5mg T6m- per PCP.     RTC 4 weeks  Patient advised to contact office with any questions, adverse effects, or acute worsening in signs and symptoms.  Discussed potential benefits, risk, and side effects of benzodiazepines to include potential risk of tolerance and dependence, as well as possible drowsiness.  Advised patient not to drive if experiencing drowsiness and to take lowest possible effective dose to minimize risk of dependence and tolerance.  Counseled patient regarding potential benefits, risks, and side effects of Lamictal to include potential risk of Stevens-Johnson syndrome. Advised patient to stop taking Lamictal and contact office immediately if rash develops and to seek urgent medical attention if rash is severe and/or spreading quickly.  Discussed potential metabolic side effects associated with atypical antipsychotics, as well as potential risk for movement side effects. Advised pt to contact office if movement side effects occur.      Nisreen Daraheen today for depression, anxiety, insomnia, other and adjustment disorder.  Diagnoses and all orders for this visit:  Adjustment disorder with mixed anxiety and depressed mood -     lamoTRIgine (LAMICTAL)  200 MG tablet; Take 1 tablet (  200 mg total) by mouth daily. -     lamoTRIgine (LAMICTAL) 25 MG tablet; Take two tablets every morning.  Generalized anxiety disorder -     lamoTRIgine (LAMICTAL) 200 MG tablet; Take 1 tablet (200 mg total) by mouth daily. -     lamoTRIgine (LAMICTAL) 25 MG tablet; Take two tablets every morning.  Major depressive disorder, recurrent episode, moderate (HCC) -     lamoTRIgine (LAMICTAL) 200 MG tablet; Take 1 tablet (200 mg total) by mouth daily. -     lamoTRIgine (LAMICTAL) 25 MG tablet; Take two tablets every morning. -     QUEtiapine (SEROQUEL) 50 MG tablet; Take one to three tablets at bedtime.  Insomnia, unspecified type -     QUEtiapine (SEROQUEL) 50 MG tablet; Take one to three tablets at bedtime.    Please see After Visit Summary for patient specific instructions.  Future Appointments  Date Time Provider [place]  10/10/2019  4:00 PM Sonda Primes,  [facility] None  10/22/2019 11:15 AM [facility] NURSE [facility]  10/24/2019  4:00 PM Sonda Primes, LCSW [facility] None  12/09/2019  4:00 PM MayAlbertina Parr, [facility] None    No orders of the defined types were placed in this encounter.     -------------------------------

## 2019-10-07 ENCOUNTER — Ambulatory Visit (INDEPENDENT_AMBULATORY_CARE_PROVIDER_SITE_OTHER): Payer: BC Managed Care – PPO | Admitting: Adult Health

## 2019-10-07 ENCOUNTER — Encounter: Payer: Self-pay | Admitting: Adult Health

## 2019-10-07 DIAGNOSIS — F331 Major depressive disorder, recurrent, moderate: Secondary | ICD-10-CM

## 2019-10-07 DIAGNOSIS — F4323 Adjustment disorder with mixed anxiety and depressed mood: Secondary | ICD-10-CM | POA: Diagnosis not present

## 2019-10-07 DIAGNOSIS — F411 Generalized anxiety disorder: Secondary | ICD-10-CM

## 2019-10-07 DIAGNOSIS — G4700 Insomnia, unspecified: Secondary | ICD-10-CM

## 2019-10-07 NOTE — Progress Notes (Signed)
Debra Buchanan [id number] 25-Nov-1955 64 y.o.  Virtual Visit via Telephone Note  I connected with pt on 10/07/19 at  4:40 PM EST by telephone and verified that I am speaking with the correct person using two identifiers.   I discussed the limitations, risks, security and privacy concerns of performing an evaluation and management service by telephone and the availability of in person appointments. I also discussed with the patient that there may be a patient responsible charge related to this service. The patient expressed understanding and agreed to proceed.   I discussed the assessment and treatment plan with the patient. The patient was provided an opportunity to ask questions and all were answered. The patient agreed with the plan and demonstrated an understanding of the instructions.   The patient was advised to call back or seek an in-person evaluation if the symptoms worsen or if the condition fails to improve as anticipated.  I provided 30 minutes of non-face-to-face time during this encounter.  The patient was located at home.  The provider was located at [place].   Aloha Gell, NP   Subjective:   Patient ID:  Debra Buchanan is a 64 y.o. (DOB Mar 23, 1956) female.  Chief Complaint: No chief complaint on file.   HPI   Debra Buchanan presents for follow-up of adjustment issues, cognitive issues, anxiety, depression, and insomnia.  Describes mood today as "ok". Pleasant. Denies tearfulness. Mood symptoms - reports decreased depression, anxiety, and irritability. Stating "I'm doing alright". Wanting to discuss ADHD. Stating my daughter is concerned about me forgetting some things and thinks I may have ADHD.  Stating "I think it's just normal stuff, but I told her I would call".  Also stating "i've been this way my whole life". Would like to discuss options and possible testing. Stable interest and motivation. Taking medications as prescribed.  Energy levels  stable.  Active, but does not have a regular exercise routine. 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 Single. Lives alone. Talking with 2 daughters. Talking with family and friends on the phone. Appetite adequate. Weight loss. 12 pounds at last visit.  Sleeps better some nights than others. Averages 6 to 8 hours. Focus and concentration has improved. Completing tasks. Managing aspects of household. Work going well.  Denies SI or HI. Denies AH or VH.  Review of Systems:  Review of Systems  Musculoskeletal: Negative for gait problem.  Neurological: Negative for tremors.  Psychiatric/Behavioral:       Please refer to HPI    Medications: I have reviewed the patient's current medications.  Current Outpatient Medications  Medication Sig Dispense Refill  . clonazePAM (KLONOPIN) 0.5 MG tablet Take 1 tablet (0.5 mg total) by mouth 2 (two) times daily. 60 tablet 3  . fluticasone (FLONASE) 50 MCG/ACT nasal spray fluticasone propionate 50 mcg/actuation nasal spray,suspension    . lamoTRIgine (LAMICTAL) 200 MG tablet Take 1 tablet (200 mg total) by mouth daily. 90 tablet 3  . lamoTRIgine (LAMICTAL) 25 MG tablet Take two tablets every morning. 180 tablet 3  . QUEtiapine (SEROQUEL) 50 MG tablet Take one to three tablets at bedtime. 270 tablet 1   No current facility-administered medications for this visit.    Medication Side Effects: None  Allergies:  Allergies  Allergen Reactions  . Grass Extracts  [Gramineae Pollens] Cough, Hives, Itching, Shortness Of Breath and Swelling  . Shellfish Allergy Cough, Hives, Itching, Rash, Shortness Of Breath and Swelling  . Iodine Swelling  .  Penicillins     Unsure of reaction    Past Medical History:  Diagnosis Date  . Allergy   . Anemia   . Anxiety   . Bipolar disorder ([place])     Family History  Problem Relation Age of Onset  . Breast cancer Mother   . Emphysema Father   . Heart attack Paternal Grandmother   .  Colon cancer Neg Hx   . Esophageal cancer Neg Hx   . Stomach cancer Neg Hx     Social History   Socioeconomic History  . Marital status: Divorced    Spouse name: Not on file  . Number of children: 2  . Years of education: Not on file  . Highest education level: Not on file  Occupational History  . Occupation: Fish farm manager: [employer]  Tobacco Use  . Smoking status: Never Smoker  . Smokeless tobacco: Never Used  Substance and Sexual Activity  . Alcohol use: Yes    Alcohol/week: 2.0 standard drinks    Types: 2 drink(s) per week    Comment: occasional  . Drug use: No  . Sexual activity: Not on file  Other Topics Concern  . Not on file  Social History Narrative   Lives at home alone   Right-handed   Rare caffeine use   Social Determinants of Health   Financial Resource Strain:   . Difficulty of Paying Living Expenses: Not on file  Food Insecurity:   . Worried About Charity fundraiser in the Last Year: Not on file  . Ran Out of Food in the Last Year: Not on file  Transportation Needs:   . Lack of Transportation (Medical): Not on file  . Lack of Transportation (Non-Medical): Not on file  Physical Activity:   . Days of Exercise per Week: Not on file  . Minutes of Exercise per Session: Not on file  Stress:   . Feeling of Stress : Not on file  Social Connections:   . Frequency of Communication with Friends and Family: Not on file  . Frequency of Social Gatherings with Friends and Family: Not on file  . Attends Religious Services: Not on file  . Active Member of Clubs or Organizations: Not on file  . Attends Archivist Meetings: Not on file  . Marital Status: Not on file  Intimate Partner Violence:   . Fear of Current or Ex-Partner: Not on file  . Emotionally Abused: Not on file  . Physically Abused: Not on file  . Sexually Abused: Not on file    Past Medical History, Surgical history, Social history, and Family history  were reviewed and updated as appropriate.   Please see review of systems for further details on the patient's review from today.   Objective:   Physical Exam:  There were no vitals taken for this visit.  Physical Exam Constitutional:      General: She is not in acute distress.    Appearance: She is well-developed.  Musculoskeletal:        General: No deformity.  Neurological:     Mental Status: She is alert and oriented to person, place, and time.     Coordination: Coordination normal.  Psychiatric:        Attention and Perception: Attention and perception normal. She does not perceive auditory or visual hallucinations.        Mood and Affect: Mood normal. Mood is not anxious or depressed. Affect is  not labile, blunt, angry or inappropriate.        Speech: Speech normal.        Behavior: Behavior normal.        Thought Content: Thought content normal. Thought content is not paranoid or delusional. Thought content does not include homicidal or suicidal ideation. Thought content does not include homicidal or suicidal plan.        Cognition and Memory: Cognition and memory normal.        Judgment: Judgment normal.     Comments: Insight intact     Lab Review:     Component Value Date/Time   NA 138 09/29/2010 2228   K 3.6 09/29/2010 2228   CL 105 09/29/2010 2228   CO2 26 09/29/2010 2228   GLUCOSE 120 (H) 09/29/2010 2228   BUN 12 09/29/2010 2228   CREATININE 0.87 09/29/2010 2228   CALCIUM 9.3 09/29/2010 2228   GFRNONAA >60 09/29/2010 2228   GFRAA  09/29/2010 2228    >60        The eGFR has been calculated using the MDRD equation. This calculation has not been validated in all clinical situations. eGFR's persistently <60 mL/min signify possible Chronic Kidney Disease.       Component Value Date/Time   WBC 5.8 01/26/2013 1254   WBC 7.4 09/29/2010 2228   RBC 3.76 (A) 01/26/2013 1254   RBC 3.86 (L) 09/29/2010 2228   HGB 11.1 (A) 01/26/2013 1254   HGB 11.7 (L)  09/29/2010 2228   HCT 34.9 (A) 01/26/2013 1254   HCT 34.2 (L) 09/29/2010 2228   PLT 282 09/29/2010 2228   MCV 92.9 01/26/2013 1254   MCH 29.5 01/26/2013 1254   MCH 30.3 09/29/2010 2228   MCHC 31.8 01/26/2013 1254   MCHC 34.2 09/29/2010 2228   RDW 12.4 09/29/2010 2228   LYMPHSABS 2.6 09/29/2010 2228   MONOABS 0.7 09/29/2010 2228   EOSABS 0.3 09/29/2010 2228   BASOSABS 0.0 09/29/2010 2228    No results found for: POCLITH, LITHIUM   No results found for: PHENYTOIN, PHENOBARB, VALPROATE, CBMZ   .res Assessment: Plan:    1. PCP started 58m Ingrezza - shoulder and neck. Plans to increase to 836mdaily. 2. Lamictal 200110mt hs and 64m56mery morning. 3. Seroquel 25mg75m to 4 at bedtime 4. Clonazepam 0.5mg T37m- per PCP.    Discussed ADHD - testing, neurology appt, taking online tests, DSM 5 criteria for diagnosing. Patient planning to do some online testing and talk with family doctor. Will call and advise what route she would like to take.   RTC 4 weeks  Patient advised to contact office with any questions, adverse effects, or acute worsening in signs and symptoms.  Discussed potential benefits, risk, and side effects of benzodiazepines to include potential risk of tolerance and dependence, as well as possible drowsiness.  Advised patient not to drive if experiencing drowsiness and to take lowest possible effective dose to minimize risk of dependence and tolerance.  Counseled patient regarding potential benefits, risks, and side effects of Lamictal to include potential risk of Stevens-Johnson syndrome. Advised patient to stop taking Lamictal and contact office immediately if rash develops and to seek urgent medical attention if rash is severe and/or spreading quickly.  Discussed potential metabolic side effects associated with atypical antipsychotics, as well as potential risk for movement side effects. Advised pt to contact office if movement side effects occur.    Diagnoses and  all orders for this visit:  Adjustment  disorder with mixed anxiety and depressed mood  Generalized anxiety disorder  Major depressive disorder, recurrent episode, moderate (HCC)  Insomnia, unspecified type    Please see After Visit Summary for patient specific instructions.  Future Appointments  Date Time Provider [place]  10/10/2019  4:00 PM Sonda Primes, [place] [facility] None  10/22/2019 11:15 AM [facility] NURSE [facility]  10/24/2019  4:00 PM Sonda Primes, LCSW [facility] None  12/09/2019  4:00 PM MayAlbertina Parr, [facility] None    No orders of the defined types were placed in this encounter.     -------------------------------

## 2019-10-10 ENCOUNTER — Ambulatory Visit (INDEPENDENT_AMBULATORY_CARE_PROVIDER_SITE_OTHER): Payer: BC Managed Care – PPO | Admitting: Licensed Clinical Social Worker

## 2019-10-10 DIAGNOSIS — F315 Bipolar disorder, current episode depressed, severe, with psychotic features: Secondary | ICD-10-CM | POA: Diagnosis not present

## 2019-10-17 DIAGNOSIS — G3184 Mild cognitive impairment, so stated: Secondary | ICD-10-CM | POA: Insufficient documentation

## 2019-10-17 DIAGNOSIS — R69 Illness, unspecified: Secondary | ICD-10-CM | POA: Insufficient documentation

## 2019-10-22 ENCOUNTER — Other Ambulatory Visit: Payer: Self-pay

## 2019-10-22 ENCOUNTER — Ambulatory Visit (INDEPENDENT_AMBULATORY_CARE_PROVIDER_SITE_OTHER): Payer: BC Managed Care – PPO

## 2019-10-22 DIAGNOSIS — I8393 Asymptomatic varicose veins of bilateral lower extremities: Secondary | ICD-10-CM

## 2019-10-22 NOTE — Progress Notes (Signed)
Treated bilateral legs spider and reticular veins with Asclera 1% administered with a 27g butterfly.  Patient received a total of 83mL. Pt tolerated well. Discussed with pt that another session would likely be needed. Provided post procedure care instructions on both handout and verbal. Will follow PRN.     Photos: Yes.    Compression stockings applied: Yes.

## 2019-10-24 ENCOUNTER — Ambulatory Visit (INDEPENDENT_AMBULATORY_CARE_PROVIDER_SITE_OTHER): Payer: BC Managed Care – PPO | Admitting: Licensed Clinical Social Worker

## 2019-10-24 DIAGNOSIS — F315 Bipolar disorder, current episode depressed, severe, with psychotic features: Secondary | ICD-10-CM | POA: Diagnosis not present

## 2019-10-29 ENCOUNTER — Ambulatory Visit: Payer: BC Managed Care – PPO | Attending: Internal Medicine

## 2019-10-29 ENCOUNTER — Other Ambulatory Visit: Payer: Self-pay

## 2019-10-29 DIAGNOSIS — Z23 Encounter for immunization: Secondary | ICD-10-CM

## 2019-10-29 NOTE — Progress Notes (Signed)
   Covid-19 Vaccination Clinic  Name:  Debra Buchanan    MRN: [id number] DOB: [birth year]  10/29/2019  [name] was observed post Covid-19 immunization for 15 minutes without incident. She was provided with Vaccine Information Sheet and instruction to access the V-Safe system.   [name] was instructed to call 911 with any severe reactions post vaccine: Marland Kitchen Difficulty breathing  . Swelling of face and throat  . A fast heartbeat  . A bad rash all over body  . Dizziness and weakness   Immunizations Administered    Name Date Dose VIS Date Route   Moderna COVID-19 Vaccine 10/29/2019 10:36 AM 0.5 mL 07/30/2019 Intramuscular   Manufacturer: Moderna   Lot: [id number]   [id number]

## 2019-11-06 ENCOUNTER — Telehealth: Payer: Self-pay

## 2019-11-06 NOTE — Telephone Encounter (Signed)
Called pt to f/u on sclerotherapy treatment. She said "so far so good", she is happy with results and will call us if she needs anything. No questions/concerns at this time.

## 2019-11-07 ENCOUNTER — Telehealth: Payer: Self-pay | Admitting: Adult Health

## 2019-11-07 ENCOUNTER — Ambulatory Visit (INDEPENDENT_AMBULATORY_CARE_PROVIDER_SITE_OTHER): Payer: BC Managed Care – PPO | Admitting: Licensed Clinical Social Worker

## 2019-11-07 DIAGNOSIS — F315 Bipolar disorder, current episode depressed, severe, with psychotic features: Secondary | ICD-10-CM

## 2019-11-07 NOTE — Telephone Encounter (Signed)
Pt called to request referral for ADHD testing. Stated you had talked about this at last visit.   [phone]

## 2019-11-07 NOTE — Telephone Encounter (Signed)
[facility] [phone].

## 2019-11-08 NOTE — Telephone Encounter (Signed)
Pt. Made aware.

## 2019-11-12 ENCOUNTER — Ambulatory Visit (INDEPENDENT_AMBULATORY_CARE_PROVIDER_SITE_OTHER): Payer: BC Managed Care – PPO | Admitting: Licensed Clinical Social Worker

## 2019-11-12 DIAGNOSIS — F315 Bipolar disorder, current episode depressed, severe, with psychotic features: Secondary | ICD-10-CM | POA: Diagnosis not present

## 2019-11-21 ENCOUNTER — Ambulatory Visit (INDEPENDENT_AMBULATORY_CARE_PROVIDER_SITE_OTHER): Payer: BC Managed Care – PPO | Admitting: Licensed Clinical Social Worker

## 2019-11-21 DIAGNOSIS — F315 Bipolar disorder, current episode depressed, severe, with psychotic features: Secondary | ICD-10-CM | POA: Diagnosis not present

## 2019-11-26 ENCOUNTER — Ambulatory Visit: Payer: BC Managed Care – PPO | Attending: Internal Medicine

## 2019-11-26 DIAGNOSIS — Z23 Encounter for immunization: Secondary | ICD-10-CM

## 2019-11-26 NOTE — Progress Notes (Signed)
   Covid-19 Vaccination Clinic  Name:  Debra Buchanan    MRN: [id number] DOB: [birth year]  11/26/2019  Ms. Gregg was observed post Covid-19 immunization for 30 minutes based on pre-vaccination screening without incident. She was provided with Vaccine Information Sheet and instruction to access the V-Safe system.   Ms. Bernardy was instructed to call 911 with any severe reactions post vaccine: Marland Kitchen Difficulty breathing  . Swelling of face and throat  . A fast heartbeat  . A bad rash all over body  . Dizziness and weakness   Immunizations Administered    Name Date Dose VIS Date Route   Moderna COVID-19 Vaccine 11/26/2019 10:32 AM 0.5 mL 07/30/2019 Intramuscular   Manufacturer: Moderna   Lot: [id number]   [id number]

## 2019-11-27 ENCOUNTER — Ambulatory Visit (INDEPENDENT_AMBULATORY_CARE_PROVIDER_SITE_OTHER): Payer: BC Managed Care – PPO | Admitting: Licensed Clinical Social Worker

## 2019-11-27 DIAGNOSIS — F315 Bipolar disorder, current episode depressed, severe, with psychotic features: Secondary | ICD-10-CM | POA: Diagnosis not present

## 2019-12-09 ENCOUNTER — Ambulatory Visit (INDEPENDENT_AMBULATORY_CARE_PROVIDER_SITE_OTHER): Payer: BC Managed Care – PPO | Admitting: Psychology

## 2019-12-09 ENCOUNTER — Ambulatory Visit: Payer: BC Managed Care – PPO | Admitting: Psychiatry

## 2019-12-09 DIAGNOSIS — F411 Generalized anxiety disorder: Secondary | ICD-10-CM | POA: Diagnosis not present

## 2019-12-10 ENCOUNTER — Ambulatory Visit: Payer: BC Managed Care – PPO | Admitting: Psychology

## 2019-12-27 ENCOUNTER — Ambulatory Visit: Payer: BC Managed Care – PPO | Admitting: Psychology

## 2020-01-08 ENCOUNTER — Ambulatory Visit (INDEPENDENT_AMBULATORY_CARE_PROVIDER_SITE_OTHER): Payer: BC Managed Care – PPO | Admitting: Psychology

## 2020-01-08 DIAGNOSIS — F411 Generalized anxiety disorder: Secondary | ICD-10-CM

## 2020-01-13 ENCOUNTER — Other Ambulatory Visit: Payer: Self-pay

## 2020-01-13 ENCOUNTER — Ambulatory Visit (INDEPENDENT_AMBULATORY_CARE_PROVIDER_SITE_OTHER): Payer: BC Managed Care – PPO | Admitting: Cardiovascular Disease

## 2020-01-13 ENCOUNTER — Encounter: Payer: Self-pay | Admitting: Cardiovascular Disease

## 2020-01-13 ENCOUNTER — Encounter: Payer: Self-pay | Admitting: *Deleted

## 2020-01-13 ENCOUNTER — Telehealth: Payer: Self-pay | Admitting: Cardiovascular Disease

## 2020-01-13 VITALS — BP 132/74 | HR 74 | Ht 66.0 in | Wt 190.8 lb

## 2020-01-13 DIAGNOSIS — R0602 Shortness of breath: Secondary | ICD-10-CM

## 2020-01-13 DIAGNOSIS — R630 Anorexia: Secondary | ICD-10-CM

## 2020-01-13 NOTE — Progress Notes (Signed)
CARDIOLOGY CONSULT NOTE  Patient ID: Debra Buchanan MRN: [id number] DOB/AGE: [birth year] 64 y.o.  Admit date: (Not on file) Primary Physician: Celene Squibb, MD  Reason for Consultation: Dyspnea on exertion  HPI: Debra Buchanan is a 64 y.o. female who is being seen today for the evaluation of dyspnea on exertion at the request of Celene Squibb, MD.   I reviewed notes from her PCP.  She saw her PCP on 12/30/19 and complained of a 29-month history of exertional dyspnea worsening in the past 2 to 3 weeks.  She also describes exertional fatigue.  This can occur when she walks long distances or when doing chores around the house.  I reviewed labs dated 03/19/19: Hemoglobin 11.8, platelets 341, white blood cells 5.5, BUN 16, creatinine 0.88, sodium 142, potassium 4.8, total cholesterol 209, triglycerides 85, HDL 86, LDL 106.  Upon speaking with her, she tells me that she used to be able to walk up a flight of stairs without difficulty and has been progressively more short of breath over the last several weeks and this began about 4 to 5 months ago.  She denies exertional chest pain and tightness.  She denies leg swelling, orthopnea, dizziness, and paroxysmal nocturnal dyspnea.  She has had issues with her appetite over the last several weeks.  She denies abdominal pain, nausea, vomiting, diarrhea, and constipation.  She had an episode about a month ago where she was brushing her teeth but she lost control of her right hand for a few seconds.  She denies any associated slurred speech.  She denies any difficulty with gait and balance.  She denies a history of smoking.  She lives alone and has been snacking more.  Social history: She is a Animal nutritionist for [employer]  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  She lives alone.    Allergies  Allergen Reactions  . Grass Extracts  [Gramineae Pollens] Cough, Hives, Itching, Shortness Of Breath and Swelling  .  Shellfish Allergy Cough, Hives, Itching, Rash, Shortness Of Breath and Swelling  . Iodine Swelling  . Penicillins     Unsure of reaction    Current Outpatient Medications  Medication Sig Dispense Refill  . aspirin EC 81 MG tablet Take 81 mg by mouth daily.    . D3-50 1.25 MG (50000 UT) capsule Take 50,000 Units by mouth once a week.    . fexofenadine (ALLEGRA) 180 MG tablet Take 180 mg by mouth daily.    . fluticasone (FLONASE) 50 MCG/ACT nasal spray Place 1 spray into both nostrils in the morning.     . lamoTRIgine (LAMICTAL) 200 MG tablet Take 200 mg by mouth at bedtime.    . lamoTRIgine (LAMICTAL) 25 MG tablet Take two tablets every morning. 180 tablet 3  . QUEtiapine (SEROQUEL) 50 MG tablet Take one to three tablets at bedtime. 270 tablet 1  . Valbenazine Tosylate (INGREZZA) 80 MG CAPS Take 1 capsule by mouth at bedtime.     No current facility-administered medications for this visit.    Past Medical History:  Diagnosis Date  . Allergy   . Anemia   . Anxiety   . Bipolar disorder [facility])     Past Surgical History:  Procedure Laterality Date  . CARPAL TUNNEL RELEASE  2001  . [place]  . TUBAL LIGATION      Social History   Socioeconomic History  . Marital status: Divorced  Spouse name: Not on file  . Number of children: 2  . Years of education: Not on file  . Highest education level: Not on file  Occupational History  . Occupation: Fish farm manager: [employer]  Tobacco Use  . Smoking status: Never Smoker  . Smokeless tobacco: Never Used  Substance and Sexual Activity  . Alcohol use: Yes    Alcohol/week: 2.0 standard drinks    Types: 2 drink(s) per week    Comment: occasional  . Drug use: No  . Sexual activity: Not on file  Other Topics Concern  . Not on file  Social History Narrative   Lives at home alone   Right-handed   Rare caffeine use   Social Determinants of Health   Financial Resource Strain:   .  Difficulty of Paying Living Expenses:   Food Insecurity:   . Worried About Charity fundraiser in the Last Year:   . Arboriculturist in the Last Year:   Transportation Needs:   . Film/video editor (Medical):   Marland Kitchen Lack of Transportation (Non-Medical):   Physical Activity:   . Days of Exercise per Week:   . Minutes of Exercise per Session:   Stress:   . Feeling of Stress :   Social Connections:   . Frequency of Communication with Friends and Family:   . Frequency of Social Gatherings with Friends and Family:   . Attends Religious Services:   . Active Member of Clubs or Organizations:   . Attends Archivist Meetings:   Marland Kitchen Marital Status:   Intimate Partner Violence:   . Fear of Current or Ex-Partner:   . Emotionally Abused:   Marland Kitchen Physically Abused:   . Sexually Abused:      No family history of premature CAD in 1st degree relatives.  Current Meds  Medication Sig  . aspirin EC 81 MG tablet Take 81 mg by mouth daily.  . D3-50 1.25 MG (50000 UT) capsule Take 50,000 Units by mouth once a week.  . fexofenadine (ALLEGRA) 180 MG tablet Take 180 mg by mouth daily.  . fluticasone (FLONASE) 50 MCG/ACT nasal spray Place 1 spray into both nostrils in the morning.   . lamoTRIgine (LAMICTAL) 200 MG tablet Take 200 mg by mouth at bedtime.  . lamoTRIgine (LAMICTAL) 25 MG tablet Take two tablets every morning.  Marland Kitchen QUEtiapine (SEROQUEL) 50 MG tablet Take one to three tablets at bedtime.  Min[place] Tosylate (INGREZZA) 80 MG CAPS Take 1 capsule by mouth at bedtime.      Review of systems complete and found to be negative unless listed above in HPI   [facility], LPN was present throughout the entirety of the encounter.  Physical exam Blood pressure 132/74, pulse 74, height 5\' 6"  (1.676 m), weight 190 lb 12.8 oz (86.5 kg), SpO2 98 %. General: NAD Neck: No JVD, no thyromegaly or thyroid nodule.  Lungs: Clear to auscultation bilaterally with normal respiratory effort. CV:  Nondisplaced PMI. Regular rate and rhythm, normal S1/S2, no S3/S4, no murmur.  No peripheral edema.  No carotid bruit.    Abdomen: Soft, nontender, no distention.  Skin: Intact without lesions or rashes.  Neurologic: Alert and oriented x 3.  Psych: Normal affect. Extremities: No clubbing or cyanosis.  HEENT: Normal.   ECG: I personally reviewed the ECG performed today which demonstrates sinus rhythm with low voltage and PACs.   Labs: Lab Results  Component Value Date/Time  K 3.6 09/29/2010 10:28 PM   BUN 12 09/29/2010 10:28 PM   CREATININE 0.87 09/29/2010 10:28 PM   HGB 11.1 (A) 01/26/2013 12:54 PM   HGB 11.7 (L) 09/29/2010 10:28 PM     Lipids: No results found for: LDLCALC, LDLDIRECT, CHOL, TRIG, HDL      ASSESSMENT AND PLAN:   1.  Dyspnea on exertion: Unclear etiology.  Non-smoker.  Symptoms are suspicious for ischemic heart disease.  I will obtain an exercise Myoview stress test.  I will also obtain a CBC to rule out anemia.  2.  Diminished appetite: She has not had any blood work since July 2020.  I will obtain a comprehensive metabolic panel, CBC, and TSH.    Disposition: Follow up virtual visit 6 weeks   Signed: Kate Sable, M.D., F.A.C.C.  01/13/2020, 10:59 AM

## 2020-01-13 NOTE — Telephone Encounter (Signed)
Pre-cert Verification for the following procedure    EXERCISE STRESS MYOVIEW   DATE:   01/20/2020  LOCATION:  [facility]

## 2020-01-13 NOTE — Patient Instructions (Addendum)
Medication Instructions:  Continue all current medications.  Labwork:  CMET, CBC, TSH - orders given today.    Office will contact with results via phone or letter.    Testing/Procedures:  Your physician has requested that you have an exercise stress myoview. For further information please visit [email or web address]. Please follow instruction sheet, as given.  Office will contact with results via phone or letter.    Follow-Up: 6 weeks   Any Other Special Instructions Will Be Listed Below (If Applicable).  If you need a refill on your cardiac medications before your next appointment, please call your pharmacy.

## 2020-01-15 LAB — CBC
HCT: 36.1 % (ref 35.0–45.0)
Hemoglobin: 12.1 g/dL (ref 11.7–15.5)
MCH: 30.3 pg (ref 27.0–33.0)
MCHC: 33.5 g/dL (ref 32.0–36.0)
MCV: 90.3 fL (ref 80.0–100.0)
MPV: 9.7 fL (ref 7.5–12.5)
Platelets: 359 10*3/uL (ref 140–400)
RBC: 4 10*6/uL (ref 3.80–5.10)
RDW: 12.4 % (ref 11.0–15.0)
WBC: 7.3 10*3/uL (ref 3.8–10.8)

## 2020-01-15 LAB — COMPREHENSIVE METABOLIC PANEL
AG Ratio: 1.6 (calc) (ref 1.0–2.5)
ALT: 19 U/L (ref 6–29)
AST: 14 U/L (ref 10–35)
Albumin: 4.4 g/dL (ref 3.6–5.1)
Alkaline phosphatase (APISO): 96 U/L (ref 37–153)
BUN: 16 mg/dL (ref 7–25)
CO2: 30 mmol/L (ref 20–32)
Calcium: 9.8 mg/dL (ref 8.6–10.4)
Chloride: 104 mmol/L (ref 98–110)
Creat: 0.89 mg/dL (ref 0.50–0.99)
Globulin: 2.8 g/dL (calc) (ref 1.9–3.7)
Glucose, Bld: 89 mg/dL (ref 65–139)
Potassium: 4.4 mmol/L (ref 3.5–5.3)
Sodium: 140 mmol/L (ref 135–146)
Total Bilirubin: 0.6 mg/dL (ref 0.2–1.2)
Total Protein: 7.2 g/dL (ref 6.1–8.1)

## 2020-01-15 LAB — TSH: TSH: 2.08 mIU/L (ref 0.40–4.50)

## 2020-01-16 ENCOUNTER — Ambulatory Visit (INDEPENDENT_AMBULATORY_CARE_PROVIDER_SITE_OTHER): Payer: BC Managed Care – PPO | Admitting: Psychology

## 2020-01-16 DIAGNOSIS — F411 Generalized anxiety disorder: Secondary | ICD-10-CM

## 2020-01-17 ENCOUNTER — Other Ambulatory Visit: Payer: Self-pay

## 2020-01-17 ENCOUNTER — Other Ambulatory Visit (HOSPITAL_COMMUNITY)
Admission: RE | Admit: 2020-01-17 | Discharge: 2020-01-17 | Disposition: A | Discharge status: Home / Self Care | Payer: BC Managed Care – PPO | Source: Ambulatory Visit | Attending: Cardiovascular Disease | Admitting: Cardiovascular Disease

## 2020-01-17 DIAGNOSIS — Z20822 Contact with and (suspected) exposure to covid-19: Secondary | ICD-10-CM | POA: Insufficient documentation

## 2020-01-17 DIAGNOSIS — Z01812 Encounter for preprocedural laboratory examination: Secondary | ICD-10-CM | POA: Diagnosis not present

## 2020-01-18 LAB — SARS CORONAVIRUS 2 (TAT 6-24 HRS): SARS Coronavirus 2: NEGATIVE

## 2020-01-20 ENCOUNTER — Encounter: Payer: Self-pay | Admitting: *Deleted

## 2020-01-20 ENCOUNTER — Encounter (HOSPITAL_BASED_OUTPATIENT_CLINIC_OR_DEPARTMENT_OTHER)
Admission: RE | Admit: 2020-01-20 | Discharge: 2020-01-20 | Disposition: A | Discharge status: Home / Self Care | Payer: BC Managed Care – PPO | Source: Ambulatory Visit | Attending: Cardiovascular Disease | Admitting: Cardiovascular Disease

## 2020-01-20 ENCOUNTER — Encounter (HOSPITAL_COMMUNITY)
Admission: RE | Admit: 2020-01-20 | Discharge: 2020-01-20 | Disposition: A | Discharge status: Home / Self Care | Payer: BC Managed Care – PPO | Source: Ambulatory Visit | Attending: Cardiovascular Disease | Admitting: Cardiovascular Disease

## 2020-01-20 ENCOUNTER — Other Ambulatory Visit: Payer: Self-pay

## 2020-01-20 ENCOUNTER — Telehealth: Payer: Self-pay | Admitting: *Deleted

## 2020-01-20 ENCOUNTER — Ambulatory Visit (INDEPENDENT_AMBULATORY_CARE_PROVIDER_SITE_OTHER): Payer: BC Managed Care – PPO | Admitting: Adult Health

## 2020-01-20 ENCOUNTER — Encounter (HOSPITAL_COMMUNITY): Payer: Self-pay

## 2020-01-20 ENCOUNTER — Encounter: Payer: Self-pay | Admitting: Adult Health

## 2020-01-20 DIAGNOSIS — F411 Generalized anxiety disorder: Secondary | ICD-10-CM

## 2020-01-20 DIAGNOSIS — R0602 Shortness of breath: Secondary | ICD-10-CM | POA: Insufficient documentation

## 2020-01-20 DIAGNOSIS — F331 Major depressive disorder, recurrent, moderate: Secondary | ICD-10-CM

## 2020-01-20 DIAGNOSIS — G4700 Insomnia, unspecified: Secondary | ICD-10-CM | POA: Diagnosis not present

## 2020-01-20 DIAGNOSIS — F902 Attention-deficit hyperactivity disorder, combined type: Secondary | ICD-10-CM

## 2020-01-20 LAB — NM MYOCAR MULTI W/SPECT W/WALL MOTION / EF
Estimated workload: 8.3 METS
Exercise duration (min): 6 min
Exercise duration (sec): 27 s
LV dias vol: 77 mL (ref 46–106)
LV sys vol: 24 mL
MPHR: 156 {beats}/min
Peak HR: 155 {beats}/min
Percent HR: 99 %
RATE: 0.42
RPE: 15
Rest HR: 61 {beats}/min
SDS: 0
SRS: 3
SSS: 3
TID: 1.35

## 2020-01-20 MED ORDER — TECHNETIUM TC 99M TETROFOSMIN IV KIT
10.0000 | PACK | Freq: Once | INTRAVENOUS | Status: AC | PRN
  Administered 2020-01-20: 10.55 via INTRAVENOUS

## 2020-01-20 MED ORDER — QUETIAPINE FUMARATE 50 MG PO TABS: ORAL_TABLET | ORAL | 1 refills | Status: DC

## 2020-01-20 MED ORDER — ESZOPICLONE 3 MG PO TABS: 3.0000 mg | ORAL_TABLET | Freq: Every day | ORAL | 2 refills | Status: DC

## 2020-01-20 MED ORDER — REGADENOSON 0.4 MG/5ML IV SOLN
INTRAVENOUS | Status: AC
  Filled 2020-01-20: qty 5

## 2020-01-20 MED ORDER — LAMOTRIGINE 200 MG PO TABS: 200.0000 mg | ORAL_TABLET | Freq: Every day | ORAL | 3 refills | Status: DC

## 2020-01-20 MED ORDER — LAMOTRIGINE 25 MG PO TABS: ORAL_TABLET | ORAL | 3 refills | Status: DC

## 2020-01-20 MED ORDER — TECHNETIUM TC 99M TETROFOSMIN IV KIT
30.0000 | PACK | Freq: Once | INTRAVENOUS | Status: AC
  Administered 2020-01-20: 28.7 via INTRAVENOUS

## 2020-01-20 MED ORDER — SODIUM CHLORIDE FLUSH 0.9 % IV SOLN
INTRAVENOUS | Status: AC
  Administered 2020-01-20: 10 mL via INTRAVENOUS
  Filled 2020-01-20: qty 10

## 2020-01-20 NOTE — Telephone Encounter (Signed)
-----   Message from Herminio Commons, MD sent at 01/18/2020  9:48 AM EDT ----- Negative

## 2020-01-20 NOTE — Progress Notes (Signed)
Debra Buchanan [id number] [birth year] 64 y.o.  Subjective:   Patient ID:  Debra Buchanan is a 64 y.o. (DOB 1956/05/21) female.  Chief Complaint: No chief complaint on file.   HPI Debra Buchanan presents to the office today for follow-up of adjustment issues, BPD, ADHD, anxiety, depression, and insomnia.  Describes mood today as "ok". Pleasant. Denies tearfulness. Mood symptoms - reports decreased depression, anxiety, and irritability. Stating "I'm doing pretty good". Finishing up school year. Daughter visiting from [place]. Planning to go to the beach over the summer. Has received both Covid vaccinations. Chamita working well for TD. Was seen by CAS last week and diagnosed with ADHD. Was prescribed Vyvanse and is waiting for a PA approval to start medication. Stable interest and motivation. Taking medications as prescribed.  Energy levels stable. Active, but does not have a regular exercise routine.  Enjoys some usual interests and activities. Single. Lives alone. Has 2 daughters - [place] and [place]. Talking with family and friends. Appetite adequate. Weight stable  Sleeps better some nights than others. Taking several hours to get to sleep. Would like to try Lunesta - "it worked well in the past".  Focus and concentration difficulties - starting on Vyvanse 86m daily for ADHD. Completing tasks. Managing aspects of household. Work going well.  Denies SI or HI. Denies AH or VH.   Review of Systems:  Review of Systems  Musculoskeletal: Negative for gait problem.  Neurological: Negative for tremors.  Psychiatric/Behavioral:       Please refer to HPI    Medications: I have reviewed the patient's current medications.  Current Outpatient Medications  Medication Sig Dispense Refill  . aspirin EC 81 MG tablet Take 81 mg by mouth daily.    . D3-50 1.25 MG (50000 UT) capsule Take 50,000 Units by mouth once a week.    . Eszopiclone 3 MG TABS Take 1 tablet (3 mg total) by mouth at bedtime.  Take immediately before bedtime 30 tablet 2  . fexofenadine (ALLEGRA) 180 MG tablet Take 180 mg by mouth daily.    . fluticasone (FLONASE) 50 MCG/ACT nasal spray Place 1 spray into both nostrils in the morning.     . lamoTRIgine (LAMICTAL) 200 MG tablet Take 1 tablet (200 mg total) by mouth at bedtime. 90 tablet 3  . lamoTRIgine (LAMICTAL) 25 MG tablet Take two tablets every morning. 180 tablet 3  . prednisoLONE acetate (PRED FORTE) 1 % ophthalmic suspension prednisolone acetate 1 % eye drops,suspension    . QUEtiapine (SEROQUEL) 50 MG tablet Take one to three tablets at bedtime. 270 tablet 1  . Valbenazine Tosylate (INGREZZA) 80 MG CAPS Take 1 capsule by mouth at bedtime.     No current facility-administered medications for this visit.    Medication Side Effects: None  Allergies:  Allergies  Allergen Reactions  . Grass Extracts  [Gramineae Pollens] Cough, Hives, Itching, Shortness Of Breath and Swelling  . Shellfish Allergy Cough, Hives, Itching, Rash, Shortness Of Breath and Swelling  . Iodine Swelling  . Penicillins     Unsure of reaction    Past Medical History:  Diagnosis Date  . Allergy   . Anemia   . Anxiety   . [place] disorder [place]     Family History  Problem Relation Age of Onset  . Breast cancer Mother   . Emphysema Father   . Heart attack Paternal Grandmother   . Colon cancer Neg Hx   . Esophageal cancer Neg Hx   . Stomach  cancer Neg Hx     Social History   Socioeconomic History  . Marital status: Divorced    Spouse name: Not on file  . Number of children: 2  . Years of education: Not on file  . Highest education level: Not on file  Occupational History  . Occupation: Fish farm manager: [employer]  Tobacco Use  . Smoking status: Never Smoker  . Smokeless tobacco: Never Used  Substance and Sexual Activity  . Alcohol use: Yes    Alcohol/week: 2.0 standard drinks    Types: 2 drink(s) per week    Comment: occasional  .  Drug use: No  . Sexual activity: Not on file  Other Topics Concern  . Not on file  Social History Narrative   Lives at home alone   Right-handed   Rare caffeine use   Social Determinants of Health   Financial Resource Strain:   . Difficulty of Paying Living Expenses:   Food Insecurity:   . Worried About Charity fundraiser in the Last Year:   . Arboriculturist in the Last Year:   Transportation Needs:   . Film/video editor (Medical):   Marland Kitchen Lack of Transportation (Non-Medical):   Physical Activity:   . Days of Exercise per Week:   . Minutes of Exercise per Session:   Stress:   . Feeling of Stress :   Social Connections:   . Frequency of Communication with Friends and Family:   . Frequency of Social Gatherings with Friends and Family:   . Attends Religious Services:   . Active Member of Clubs or Organizations:   . Attends Archivist Meetings:   Marland Kitchen Marital Status:   Intimate Partner Violence:   . Fear of Current or Ex-Partner:   . Emotionally Abused:   Marland Kitchen Physically Abused:   . Sexually Abused:     Past Medical History, Surgical history, Social history, and Family history were reviewed and updated as appropriate.   Please see review of systems for further details on the patient's review from today.   Objective:   Physical Exam:  There were no vitals taken for this visit.  Physical Exam  Lab Review:     Component Value Date/Time   NA 140 01/14/2020 1440   K 4.4 01/14/2020 1440   CL 104 01/14/2020 1440   CO2 30 01/14/2020 1440   GLUCOSE 89 01/14/2020 1440   BUN 16 01/14/2020 1440   CREATININE 0.89 01/14/2020 1440   CALCIUM 9.8 01/14/2020 1440   PROT 7.2 01/14/2020 1440   AST 14 01/14/2020 1440   ALT 19 01/14/2020 1440   BILITOT 0.6 01/14/2020 1440   GFRNONAA >60 09/29/2010 2228   GFRAA  09/29/2010 2228    >60        The eGFR has been calculated using the MDRD equation. This calculation has not been validated in all  clinical situations. eGFR's persistently <60 mL/min signify possible Chronic Kidney Disease.       Component Value Date/Time   WBC 7.3 01/14/2020 1440   RBC 4.00 01/14/2020 1440   HGB 12.1 01/14/2020 1440   HCT 36.1 01/14/2020 1440   PLT 359 01/14/2020 1440   MCV 90.3 01/14/2020 1440   MCV 92.9 01/26/2013 1254   MCH 30.3 01/14/2020 1440   MCHC 33.5 01/14/2020 1440   RDW 12.4 01/14/2020 1440   LYMPHSABS 2.6 09/29/2010 2228   MONOABS 0.7 09/29/2010 2228   EOSABS 0.3  09/29/2010 2228   BASOSABS 0.0 09/29/2010 2228    No results found for: POCLITH, LITHIUM   No results found for: PHENYTOIN, PHENOBARB, VALPROATE, CBMZ   .res Assessment: Plan:    Plan:  Add Lunesta 21m at hs   Lamictal 2075mat hs and 5052mvery morning. Seroquel 11m84m3 to 4 at bedtime - will taper off with adding Lunesta  Seen by CAS on 01/17/2020 - diagnosed with ADHD - staring on Vyvanse 20mg30mly when PA approved.   RTC 4 weeks  Patient advised to contact office with any questions, adverse effects, or acute worsening in signs and symptoms.  Discussed potential benefits, risk, and side effects of benzodiazepines to include potential risk of tolerance and dependence, as well as possible drowsiness.  Advised patient not to drive if experiencing drowsiness and to take lowest possible effective dose to minimize risk of dependence and tolerance.  Counseled patient regarding potential benefits, risks, and side effects of Lamictal to include potential risk of Stevens-Johnson syndrome. Advised patient to stop taking Lamictal and contact office immediately if rash develops and to seek urgent medical attention if rash is severe and/or spreading quickly.  Discussed potential metabolic side effects associated with atypical antipsychotics, as well as potential risk for movement side effects. Advised pt to contact office if movement side effects occur.    Diagnoses and all orders for this visit:  Attention  deficit hyperactivity disorder (ADHD), combined type  Major depressive disorder, recurrent episode, moderate (HCC) -     QUEtiapine (SEROQUEL) 50 MG tablet; Take one to three tablets at bedtime. -     lamoTRIgine (LAMICTAL) 25 MG tablet; Take two tablets every morning. -     lamoTRIgine (LAMICTAL) 200 MG tablet; Take 1 tablet (200 mg total) by mouth at bedtime.  Insomnia, unspecified type -     QUEtiapine (SEROQUEL) 50 MG tablet; Take one to three tablets at bedtime. -     Eszopiclone 3 MG TABS; Take 1 tablet (3 mg total) by mouth at bedtime. Take immediately before bedtime  Generalized anxiety disorder -     lamoTRIgine (LAMICTAL) 25 MG tablet; Take two tablets every morning. -     lamoTRIgine (LAMICTAL) 200 MG tablet; Take 1 tablet (200 mg total) by mouth at bedtime.     Please see After Visit Summary for patient specific instructions.  Future Appointments  Date Time Provider [place]  3:00 PM BarkeDolores L[facility]  02/06/2020 12:00 PM BarkeClarice PoleBH-[facility] None  02/24/2020  2:00 PM Mozingo, ReginBerdie OgrenCP-CP None  02/26/2020 10:20 AM KonesHerminio CommonsCVD-[facility]    No orders of the defined types were placed in this encounter.   -------------------------------

## 2020-01-20 NOTE — Telephone Encounter (Signed)
Laurine Blazer, [place]  [phone]-GRM PM EDT    No answer. Will notify via my chart. Copy to pcp.

## 2020-01-21 ENCOUNTER — Other Ambulatory Visit: Payer: Self-pay | Admitting: Obstetrics and Gynecology

## 2020-01-21 DIAGNOSIS — R928 Other abnormal and inconclusive findings on diagnostic imaging of breast: Secondary | ICD-10-CM

## 2020-01-22 ENCOUNTER — Ambulatory Visit: Admission: RE | Admit: 2020-01-22 | Payer: BC Managed Care – PPO | Source: Ambulatory Visit

## 2020-01-22 ENCOUNTER — Other Ambulatory Visit: Payer: Self-pay

## 2020-01-22 ENCOUNTER — Ambulatory Visit
Admission: RE | Admit: 2020-01-22 | Discharge: 2020-01-22 | Disposition: A | Discharge status: Home / Self Care | Payer: BC Managed Care – PPO | Source: Ambulatory Visit | Attending: Obstetrics and Gynecology | Admitting: Obstetrics and Gynecology

## 2020-01-22 ENCOUNTER — Telehealth: Payer: Self-pay

## 2020-01-22 DIAGNOSIS — R928 Other abnormal and inconclusive findings on diagnostic imaging of breast: Secondary | ICD-10-CM

## 2020-01-22 DIAGNOSIS — I10 Essential (primary) hypertension: Secondary | ICD-10-CM

## 2020-01-22 NOTE — Telephone Encounter (Signed)
-----   Message from Herminio Commons, MD sent at 01/21/2020 12:42 PM EDT ----- This patient lives in [place]. BP demonstrated a hypertensive response to exercise and her primary complaints related to shortness of breath with exertion when I initially evaluated her.  Please obtain an echocardiogram for further assessment.

## 2020-01-22 NOTE — Telephone Encounter (Signed)
Pt notified,echo scheduled

## 2020-01-23 ENCOUNTER — Ambulatory Visit: Payer: BC Managed Care – PPO | Admitting: Psychology

## 2020-01-30 ENCOUNTER — Ambulatory Visit: Payer: BC Managed Care – PPO | Admitting: Psychology

## 2020-02-03 ENCOUNTER — Other Ambulatory Visit: Payer: BC Managed Care – PPO

## 2020-02-05 ENCOUNTER — Other Ambulatory Visit (HOSPITAL_COMMUNITY): Payer: BC Managed Care – PPO

## 2020-02-06 ENCOUNTER — Ambulatory Visit: Payer: BC Managed Care – PPO | Admitting: Psychology

## 2020-02-10 ENCOUNTER — Ambulatory Visit (HOSPITAL_COMMUNITY)
Admission: RE | Admit: 2020-02-10 | Discharge: 2020-02-10 | Disposition: A | Discharge status: Home / Self Care | Payer: Medicare PPO | Source: Ambulatory Visit | Attending: Cardiovascular Disease | Admitting: Cardiovascular Disease

## 2020-02-10 ENCOUNTER — Other Ambulatory Visit: Payer: Self-pay

## 2020-02-10 DIAGNOSIS — I10 Essential (primary) hypertension: Secondary | ICD-10-CM | POA: Diagnosis present

## 2020-02-10 NOTE — Progress Notes (Signed)
*  PRELIMINARY RESULTS* Echocardiogram 2D Echocardiogram has been performed.  Samuel Germany 02/10/2020, 10:23 AM

## 2020-02-11 ENCOUNTER — Telehealth (INDEPENDENT_AMBULATORY_CARE_PROVIDER_SITE_OTHER): Payer: Medicare PPO | Admitting: Cardiology

## 2020-02-11 ENCOUNTER — Encounter: Payer: Self-pay | Admitting: Cardiology

## 2020-02-11 VITALS — Ht 66.0 in | Wt 188.0 lb

## 2020-02-11 DIAGNOSIS — R0600 Dyspnea, unspecified: Secondary | ICD-10-CM

## 2020-02-11 DIAGNOSIS — R0609 Other forms of dyspnea: Secondary | ICD-10-CM

## 2020-02-11 NOTE — Progress Notes (Signed)
Virtual Visit via Telephone Note   This visit type was conducted due to national recommendations for restrictions regarding the COVID-19 Pandemic (e.g. social distancing) in an effort to limit this patient's exposure and mitigate transmission in our community.  Due to her co-morbid illnesses, this patient is at least at moderate risk for complications without adequate follow up.  This format is felt to be most appropriate for this patient at this time.  The patient did not have access to video technology/had technical difficulties with video requiring transitioning to audio format only (telephone).  All issues noted in this document were discussed and addressed.  No physical exam could be performed with this format.  Please refer to the patient's chart for her  consent to telehealth for [facility].   The patient was identified using 2 identifiers.  Date:  02/11/2020   ID:  Debra Buchanan, DOB 1956/06/10, MRN [id number]  Patient Location: Home Provider Location: Office  PCP:  Celene Squibb, MD  Cardiologist:  Carlyle Dolly, MD  Electrophysiologist:  None   Evaluation Performed:  Follow-Up Visit  Chief Complaint:  Follow up  History of Present Illness:    Debra Buchanan is a 64 y.o. female seen today for follow up of the following medical problems.   Seen by Dr Bronson Ing initially for DOE , this is our first visit together.  1. DOE - on initialy evaluation by Dr Bronson Ing she reported a 5 month history of DOE - denied any chest pain, no LE edema, or orthopnea - 12/2019 nuclear stress: Duke treadmill score of 6.5, tolerated 8.3 METs, no ischemia. Normal exercise capacity. With exercise bp 187/63  - 12/2019 echo LVEF 55-60%, grade I DDx, normal RV,    The patient does not have symptoms concerning for COVID-19 infection (fever, chills, cough, or new shortness of breath).    Past Medical History:  Diagnosis Date  . Allergy   . Anemia   . Anxiety   . Bipolar disorder  [facility])    Past Surgical History:  Procedure Laterality Date  . CARPAL TUNNEL RELEASE  2001  . [place]  . TUBAL LIGATION       No outpatient medications have been marked as taking for the 02/11/20 encounter (Appointment) with Arnoldo Lenis, MD.     Allergies:   Grass extracts  [gramineae pollens], Shellfish allergy, Iodine, and Penicillins   Social History   Tobacco Use  . Smoking status: Never Smoker  . Smokeless tobacco: Never Used  Vaping Use  . Vaping Use: Never assessed  Substance Use Topics  . Alcohol use: Yes    Alcohol/week: 2.0 standard drinks    Types: 2 drink(s) per week    Comment: occasional  . Drug use: No     Family Hx: The patient's family history includes Breast cancer in her mother; Emphysema in her father; Heart attack in her paternal grandmother. There is no history of Colon cancer, Esophageal cancer, or Stomach cancer.  ROS:   Please see the history of present illness.     All other systems reviewed and are negative.   Prior CV studies:   The following studies were reviewed today:    Labs/Other Tests and Data Reviewed:    EKG:  No ECG reviewed.  Recent Labs: 01/14/2020: ALT 19; BUN 16; Creat 0.89; Hemoglobin 12.1; Platelets 359; Potassium 4.4; Sodium 140; TSH 2.08   Recent Lipid Panel No results found for: CHOL, TRIG, HDL, CHOLHDL, LDLCALC, LDLDIRECT  Wt Readings from Last 3 Encounters:  01/13/20 190 lb 12.8 oz (86.5 kg)  06/24/19 181 lb 4.8 oz (82.2 kg)  09/13/18 168 lb (76.2 kg)     Objective:    Vital Signs:   Today's Vitals   02/11/20 0956  Weight: 188 lb (85.3 kg)  Height: 5\' 6"  (1.676 m)   Body mass index is 30.34 kg/m. Normal affect. Normal speech pattern and tone. Comfortable, no apparent distress. No audible signs of sob or wheezing.   ASSESSMENT & PLAN:    1. DOE - overall benign cardiac testing. Exercise stress test shows she tolerates age appropriate level of exertion, no signs of ischemia  by EKG or imaging. Echo with just very mild diastolic dysfunction, other wise normal - no plans for any further cardiac testing. Once again her exercise tolerance is normal for her age. She has gained 20 lbs since Jan 2020, perhaps her symptoms are more related to weight gain and deconditioning. If progression of symptoms pcp could consider pulmonoary workup   F/u as needed  COVID-19 Education: The signs and symptoms of COVID-19 were discussed with the patient and how to seek care for testing (follow up with PCP or arrange E-visit).  The importance of social distancing was discussed today.  Time:   Today, I have spent 14 minutes with the patient with telehealth technology discussing the above problems.     Medication Adjustments/Labs and Tests Ordered: Current medicines are reviewed at length with the patient today.  Concerns regarding medicines are outlined above.   Tests Ordered: No orders of the defined types were placed in this encounter.   Medication Changes: No orders of the defined types were placed in this encounter.   Follow Up:  As needed  Signed, Carlyle Dolly, MD  02/11/2020 8:26 AM    [facility]

## 2020-02-11 NOTE — Patient Instructions (Signed)
Your physician recommends that you schedule a follow-up appointment in: AS NEEDED WITH DR BRANCH  Your physician recommends that you continue on your current medications as directed. Please refer to the Current Medication list given to you today.  Thank you for choosing  [facility]!!    

## 2020-02-24 ENCOUNTER — Telehealth (INDEPENDENT_AMBULATORY_CARE_PROVIDER_SITE_OTHER): Payer: Medicare PPO | Admitting: Adult Health

## 2020-02-24 DIAGNOSIS — F411 Generalized anxiety disorder: Secondary | ICD-10-CM | POA: Diagnosis not present

## 2020-02-24 DIAGNOSIS — F331 Major depressive disorder, recurrent, moderate: Secondary | ICD-10-CM

## 2020-02-24 DIAGNOSIS — G4700 Insomnia, unspecified: Secondary | ICD-10-CM

## 2020-02-24 DIAGNOSIS — F902 Attention-deficit hyperactivity disorder, combined type: Secondary | ICD-10-CM

## 2020-02-24 DIAGNOSIS — F4323 Adjustment disorder with mixed anxiety and depressed mood: Secondary | ICD-10-CM

## 2020-02-24 MED ORDER — AMPHETAMINE-DEXTROAMPHET ER 20 MG PO CP24: 20.0000 mg | ORAL_CAPSULE | Freq: Every day | ORAL | 0 refills | Status: DC

## 2020-02-24 NOTE — Progress Notes (Signed)
Debra Buchanan [id number] [birth year] 64 y.o.  Virtual Visit via Telephone Note  I connected with pt on 02/24/20 at  2:00 PM EDT by telephone and verified that I am speaking with the correct person using two identifiers.   I discussed the limitations, risks, security and privacy concerns of performing an evaluation and management service by telephone and the availability of in person appointments. I also discussed with the patient that there may be a patient responsible charge related to this service. The patient expressed understanding and agreed to proceed.   I discussed the assessment and treatment plan with the patient. The patient was provided an opportunity to ask questions and all were answered. The patient agreed with the plan and demonstrated an understanding of the instructions.   The patient was advised to call back or seek an in-person evaluation if the symptoms worsen or if the condition fails to improve as anticipated.  I provided 30 minutes of non-face-to-face time during this encounter.  The patient was located at home.  The provider was located at [place].   Aloha Gell, NP   Subjective:   Patient ID:  Debra Buchanan is a 64 y.o. (DOB 16-Aug-1956) female.  Chief Complaint: No chief complaint on file.   HPI Debra Buchanan presents for follow-up of adjustment issues, BPD, ADHD, anxiety, depression, and insomnia.  Describes mood today as "ok". Pleasant. Denies tearfulness. Mood symptoms - denies depression, anxiety, and irritability. Stating "I'm doing alright". Able to get things done around the house. Visiting her sister at [facility] this week. Stable interest and motivation. Taking medications as prescribed. Seeing therapist. Energy levels stable. Active, has a regular exercise routine. Joined the [employer].  Enjoys some usual interests and activities. Single. Lives alone. Has 2 daughters - [place] and [place]. Talking with family and friends.   Appetite adequate. Weight stable  Sleeps well most nights. Averages 8 hours a night with Lunesta 79m and Seroquel 222m Focus and concentration about the same with addition of Vyvanse 2046mCompleting tasks. Managing aspects of household. 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 Denies AH or VH.  Review of Systems:  Review of Systems  Musculoskeletal: Negative for gait problem.  Neurological: Negative for tremors.  Psychiatric/Behavioral:       Please refer to HPI    Medications: I have reviewed the patient's current medications.  Current Outpatient Medications  Medication Sig Dispense Refill  . amphetamine-dextroamphetamine (ADDERALL XR) 20 MG 24 hr capsule Take 1 capsule (20 mg total) by mouth daily. 30 capsule 0  . aspirin EC 81 MG tablet Take 81 mg by mouth daily.    . D3-50 1.25 MG (50000 UT) capsule Take 50,000 Units by mouth once a week.    . Eszopiclone 3 MG TABS Take 1 tablet (3 mg total) by mouth at bedtime. Take immediately before bedtime 30 tablet 2  . fexofenadine (ALLEGRA) 180 MG tablet Take 180 mg by mouth daily.    . fluticasone (FLONASE) 50 MCG/ACT nasal spray Place 1 spray into both nostrils in the morning.     . lamoTRIgine (LAMICTAL) 200 MG tablet Take 1 tablet (200 mg total) by mouth at bedtime. 90 tablet 3  . lamoTRIgine (LAMICTAL) 25 MG tablet Take two tablets every morning. 180 tablet 3  . prednisoLONE acetate (PRED FORTE) 1 % ophthalmic suspension prednisolone acetate 1 % eye drops,suspension    . QUEtiapine (SEROQUEL) 50 MG tablet Take one to three tablets  at bedtime. 270 tablet 1  . Valbenazine Tosylate (INGREZZA) 80 MG CAPS Take 1 capsule by mouth at bedtime.     No current facility-administered medications for this visit.    Medication Side Effects: None  Allergies:  Allergies  Allergen Reactions  . Grass Extracts  [Gramineae Pollens] Cough, Hives, Itching, Shortness Of Breath and Swelling  . Shellfish Allergy Cough, Hives,  Itching, Rash, Shortness Of Breath and Swelling  . Iodine Swelling  . Penicillins     Unsure of reaction    Past Medical History:  Diagnosis Date  . Allergy   . Anemia   . Anxiety   . Bipolar disorder ([place])     Family History  Problem Relation Age of Onset  . Breast cancer Mother   . Emphysema Father   . Heart attack Paternal Grandmother   . Colon cancer Neg Hx   . Esophageal cancer Neg Hx   . Stomach cancer Neg Hx     Social History   Socioeconomic History  . Marital status: Divorced    Spouse name: Not on file  . Number of children: 2  . Years of education: Not on file  . Highest education level: Not on file  Occupational History  . Occupation: Fish farm manager: [employer]  Tobacco Use  . Smoking status: Never Smoker  . Smokeless tobacco: Never Used  Vaping Use  . Vaping Use: Never assessed  Substance and Sexual Activity  . Alcohol use: Yes    Alcohol/week: 2.0 standard drinks    Types: 2 drink(s) per week    Comment: occasional  . Drug use: No  . Sexual activity: Not on file  Other Topics Concern  . Not on file  Social History Narrative   Lives at home alone   Right-handed   Rare caffeine use   Social Determinants of Health   Financial Resource Strain:   . Difficulty of Paying Living Expenses:   Food Insecurity:   . Worried About Charity fundraiser in the Last Year:   . Arboriculturist in the Last Year:   Transportation Needs:   . Film/video editor (Medical):   Marland Kitchen Lack of Transportation (Non-Medical):   Physical Activity:   . Days of Exercise per Week:   . Minutes of Exercise per Session:   Stress:   . Feeling of Stress :   Social Connections:   . Frequency of Communication with Friends and Family:   . Frequency of Social Gatherings with Friends and Family:   . Attends Religious Services:   . Active Member of Clubs or Organizations:   . Attends Archivist Meetings:   Marland Kitchen Marital Status:   Intimate  Partner Violence:   . Fear of Current or Ex-Partner:   . Emotionally Abused:   Marland Kitchen Physically Abused:   . Sexually Abused:     Past Medical History, Surgical history, Social history, and Family history were reviewed and updated as appropriate.   Please see review of systems for further details on the patient's review from today.   Objective:   Physical Exam:  There were no vitals taken for this visit.  Physical Exam Constitutional:      General: She is not in acute distress. Musculoskeletal:        General: No deformity.  Neurological:     Mental Status: She is alert and oriented to person, place, and time.     Coordination: Coordination normal.  Psychiatric:        Attention and Perception: Attention and perception normal. She does not perceive auditory or visual hallucinations.        Mood and Affect: Mood normal. Mood is not anxious or depressed. Affect is not labile, blunt, angry or inappropriate.        Speech: Speech normal.        Behavior: Behavior normal.        Thought Content: Thought content normal. Thought content is not paranoid or delusional. Thought content does not include homicidal or suicidal ideation. Thought content does not include homicidal or suicidal plan.        Cognition and Memory: Cognition and memory normal.        Judgment: Judgment normal.     Comments: Insight intact     Lab Review:     Component Value Date/Time   NA 140 01/14/2020 1440   K 4.4 01/14/2020 1440   CL 104 01/14/2020 1440   CO2 30 01/14/2020 1440   GLUCOSE 89 01/14/2020 1440   BUN 16 01/14/2020 1440   CREATININE 0.89 01/14/2020 1440   CALCIUM 9.8 01/14/2020 1440   PROT 7.2 01/14/2020 1440   AST 14 01/14/2020 1440   ALT 19 01/14/2020 1440   BILITOT 0.6 01/14/2020 1440   GFRNONAA >60 09/29/2010 2228   GFRAA  09/29/2010 2228    >60        The eGFR has been calculated using the MDRD equation. This calculation has not been validated in all clinical situations. eGFR's  persistently <60 mL/min signify possible Chronic Kidney Disease.       Component Value Date/Time   WBC 7.3 01/14/2020 1440   RBC 4.00 01/14/2020 1440   HGB 12.1 01/14/2020 1440   HCT 36.1 01/14/2020 1440   PLT 359 01/14/2020 1440   MCV 90.3 01/14/2020 1440   MCV 92.9 01/26/2013 1254   MCH 30.3 01/14/2020 1440   MCHC 33.5 01/14/2020 1440   RDW 12.4 01/14/2020 1440   LYMPHSABS 2.6 09/29/2010 2228   MONOABS 0.7 09/29/2010 2228   EOSABS 0.3 09/29/2010 2228   BASOSABS 0.0 09/29/2010 2228    No results found for: POCLITH, LITHIUM   No results found for: PHENYTOIN, PHENOBARB, VALPROATE, CBMZ   .res Assessment: Plan:     Plan:  Lunesta 3m at hs   Lamictal 20107mat hs and 5032mvery morning. Seroquel 65m79m3 to 4 at bedtime  Stop Vyvanse 20mg47mly  Add Adderall XR 20mg 3my  Recent BP check at PCP - WNL  RTC 4 weeks  Patient advised to contact office with any questions, adverse effects, or acute worsening in signs and symptoms.  Discussed potential benefits, risk, and side effects of benzodiazepines to include potential risk of tolerance and dependence, as well as possible drowsiness.  Advised patient not to drive if experiencing drowsiness and to take lowest possible effective dose to minimize risk of dependence and tolerance.  Counseled patient regarding potential benefits, risks, and side effects of Lamictal to include potential risk of Stevens-Johnson syndrome. Advised patient to stop taking Lamictal and contact office immediately if rash develops and to seek urgent medical attention if rash is severe and/or spreading quickly.  Discussed potential metabolic side effects associated with atypical antipsychotics, as well as potential risk for movement side effects. Advised pt to contact office if movement side effects occur.     Diagnoses and all orders for this visit:  Adjustment disorder with mixed anxiety and depressed mood  Attention deficit hyperactivity  disorder (ADHD), combined type  Generalized anxiety disorder  Insomnia, unspecified type  Major depressive disorder, recurrent episode, moderate (HCC)  Other orders -     amphetamine-dextroamphetamine (ADDERALL XR) 20 MG 24 hr capsule; Take 1 capsule (20 mg total) by mouth daily.    Please see After Visit Summary for patient specific instructions.  No future appointments.  No orders of the defined types were placed in this encounter.     -------------------------------

## 2020-02-26 ENCOUNTER — Telehealth: Payer: BC Managed Care – PPO | Admitting: Cardiovascular Disease

## 2020-03-19 ENCOUNTER — Encounter: Payer: Self-pay | Admitting: Adult Health

## 2020-03-19 ENCOUNTER — Telehealth (INDEPENDENT_AMBULATORY_CARE_PROVIDER_SITE_OTHER): Payer: Medicare PPO | Admitting: Adult Health

## 2020-03-19 DIAGNOSIS — F4323 Adjustment disorder with mixed anxiety and depressed mood: Secondary | ICD-10-CM

## 2020-03-19 DIAGNOSIS — F902 Attention-deficit hyperactivity disorder, combined type: Secondary | ICD-10-CM | POA: Diagnosis not present

## 2020-03-19 DIAGNOSIS — G4700 Insomnia, unspecified: Secondary | ICD-10-CM

## 2020-03-19 DIAGNOSIS — F331 Major depressive disorder, recurrent, moderate: Secondary | ICD-10-CM

## 2020-03-19 DIAGNOSIS — F411 Generalized anxiety disorder: Secondary | ICD-10-CM | POA: Diagnosis not present

## 2020-03-19 MED ORDER — AMPHETAMINE-DEXTROAMPHETAMINE 20 MG PO TABS
20.0000 mg | ORAL_TABLET | Freq: Every day | ORAL | 0 refills | Status: DC
Start: 2020-03-19 — End: 2020-04-08

## 2020-03-19 NOTE — Progress Notes (Signed)
Debra Buchanan [id number] 08-19-1956 64 y.o.  Virtual Visit via Telephone Note  I connected with pt on 03/19/20 at  9:00 AM EDT by telephone and verified that I am speaking with the correct person using two identifiers.   I discussed the limitations, risks, security and privacy concerns of performing an evaluation and management service by telephone and the availability of in person appointments. I also discussed with the patient that there may be a patient responsible charge related to this service. The patient expressed understanding and agreed to proceed.   I discussed the assessment and treatment plan with the patient. The patient was provided an opportunity to ask questions and all were answered. The patient agreed with the plan and demonstrated an understanding of the instructions.   The patient was advised to call back or seek an in-person evaluation if the symptoms worsen or if the condition fails to improve as anticipated.  I provided 30 minutes of non-face-to-face time during this encounter.  The patient was located at home.  The provider was located at [place].   Aloha Gell, NP   Subjective:   Patient ID:  Debra Buchanan is a 64 y.o. (DOB 1956-05-09) female.  Chief Complaint: No chief complaint on file.   HPI Debra Buchanan presents for follow-up of adjustment issues, BPD, ADHD, anxiety, depression, and insomnia.  Describes mood today as "ok". Pleasant. Tearful at times. Mood symptoms - reports depression, anxiety, and irritability "at times". Had 3 days last week of feeling "down". Stating "I get lonely sometimes". Also stating "I'm doing better now". Feeling more positive this week - "trying to stay busy". Visiting sister at [place] to work on August. Stable interest and motivation. Taking medications as prescribed. Seeing therapist. Energy levels stable. Active, exercising some. Joined the [employer]. Walking dog wice a day. Plans to work with a  Physiological scientist.  Enjoys some usual interests and activities. Single. Lives alone. Has 2 daughters - [place] and [place]. Talking with family and friends.  Appetite adequate. Weight stable  Sleeps better some nights than others. Averages 6 to 8 hours. Taking Lunesta 14m and Seroquel 293m Focus and concentration difficulties - does not feel like the Adderall XR or Vyvanse have been helpful.  Would like to try and IR medication. Completing tasks. Managing aspects of household. 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 Denies AH or VH.   Review of Systems:  Review of Systems  Musculoskeletal: Negative for gait problem.  Neurological: Negative for tremors.  Psychiatric/Behavioral:       Please refer to HPI    Medications: I have reviewed the patient's current medications.  Current Outpatient Medications  Medication Sig Dispense Refill  . amphetamine-dextroamphetamine (ADDERALL XR) 20 MG 24 hr capsule Take 1 capsule (20 mg total) by mouth daily. 30 capsule 0  . amphetamine-dextroamphetamine (ADDERALL) 20 MG tablet Take 1 tablet (20 mg total) by mouth daily. 30 tablet 0  . aspirin EC 81 MG tablet Take 81 mg by mouth daily.    . D3-50 1.25 MG (50000 UT) capsule Take 50,000 Units by mouth once a week.    . Eszopiclone 3 MG TABS Take 1 tablet (3 mg total) by mouth at bedtime. Take immediately before bedtime 30 tablet 2  . fexofenadine (ALLEGRA) 180 MG tablet Take 180 mg by mouth daily.    . fluticasone (FLONASE) 50 MCG/ACT nasal spray Place 1 spray into both nostrils in the morning.     .Marland Kitchen  lamoTRIgine (LAMICTAL) 200 MG tablet Take 1 tablet (200 mg total) by mouth at bedtime. 90 tablet 3  . lamoTRIgine (LAMICTAL) 25 MG tablet Take two tablets every morning. 180 tablet 3  . prednisoLONE acetate (PRED FORTE) 1 % ophthalmic suspension prednisolone acetate 1 % eye drops,suspension    . QUEtiapine (SEROQUEL) 50 MG tablet Take one to three tablets at bedtime. 270 tablet 1  .  Valbenazine Tosylate (INGREZZA) 80 MG CAPS Take 1 capsule by mouth at bedtime.     No current facility-administered medications for this visit.    Medication Side Effects: None  Allergies:  Allergies  Allergen Reactions  . Grass Extracts  [Gramineae Pollens] Cough, Hives, Itching, Shortness Of Breath and Swelling  . Shellfish Allergy Cough, Hives, Itching, Rash, Shortness Of Breath and Swelling  . Iodine Swelling  . Penicillins     Unsure of reaction    Past Medical History:  Diagnosis Date  . Allergy   . Anemia   . Anxiety   . Bipolar disorder ([place])     Family History  Problem Relation Age of Onset  . Breast cancer Mother   . Emphysema Father   . Heart attack Paternal Grandmother   . Colon cancer Neg Hx   . Esophageal cancer Neg Hx   . Stomach cancer Neg Hx     Social History   Socioeconomic History  . Marital status: Divorced    Spouse name: Not on file  . Number of children: 2  . Years of education: Not on file  . Highest education level: Not on file  Occupational History  . Occupation: Fish farm manager: [employer]  Tobacco Use  . Smoking status: Never Smoker  . Smokeless tobacco: Never Used  Vaping Use  . Vaping Use: Never assessed  Substance and Sexual Activity  . Alcohol use: Yes    Alcohol/week: 2.0 standard drinks    Types: 2 drink(s) per week    Comment: occasional  . Drug use: No  . Sexual activity: Not on file  Other Topics Concern  . Not on file  Social History Narrative   Lives at home alone   Right-handed   Rare caffeine use   Social Determinants of Health   Financial Resource Strain:   . Difficulty of Paying Living Expenses:   Food Insecurity:   . Worried About Charity fundraiser in the Last Year:   . Arboriculturist in the Last Year:   Transportation Needs:   . Film/video editor (Medical):   Marland Kitchen Lack of Transportation (Non-Medical):   Physical Activity:   . Days of Exercise per Week:   .  Minutes of Exercise per Session:   Stress:   . Feeling of Stress :   Social Connections:   . Frequency of Communication with Friends and Family:   . Frequency of Social Gatherings with Friends and Family:   . Attends Religious Services:   . Active Member of Clubs or Organizations:   . Attends Archivist Meetings:   Marland Kitchen Marital Status:   Intimate Partner Violence:   . Fear of Current or Ex-Partner:   . Emotionally Abused:   Marland Kitchen Physically Abused:   . Sexually Abused:     Past Medical History, Surgical history, Social history, and Family history were reviewed and updated as appropriate.   Please see review of systems for further details on the patient's review from today.   Objective:  Physical Exam:  There were no vitals taken for this visit.  Physical Exam Neurological:     Mental Status: She is alert and oriented to person, place, and time.     Cranial Nerves: No dysarthria.  Psychiatric:        Attention and Perception: Attention and perception normal.        Mood and Affect: Mood normal.        Speech: Speech normal.        Behavior: Behavior is cooperative.        Thought Content: Thought content normal. Thought content is not paranoid or delusional. Thought content does not include homicidal or suicidal ideation. Thought content does not include homicidal or suicidal plan.        Cognition and Memory: Cognition and memory normal.        Judgment: Judgment normal.     Comments: Insight intact     Lab Review:     Component Value Date/Time   NA 140 01/14/2020 1440   K 4.4 01/14/2020 1440   CL 104 01/14/2020 1440   CO2 30 01/14/2020 1440   GLUCOSE 89 01/14/2020 1440   BUN 16 01/14/2020 1440   CREATININE 0.89 01/14/2020 1440   CALCIUM 9.8 01/14/2020 1440   PROT 7.2 01/14/2020 1440   AST 14 01/14/2020 1440   ALT 19 01/14/2020 1440   BILITOT 0.6 01/14/2020 1440   GFRNONAA >60 09/29/2010 2228   GFRAA  09/29/2010 2228    >60        The eGFR has been  calculated using the MDRD equation. This calculation has not been validated in all clinical situations. eGFR's persistently <60 mL/min signify possible Chronic Kidney Disease.       Component Value Date/Time   WBC 7.3 01/14/2020 1440   RBC 4.00 01/14/2020 1440   HGB 12.1 01/14/2020 1440   HCT 36.1 01/14/2020 1440   PLT 359 01/14/2020 1440   MCV 90.3 01/14/2020 1440   MCV 92.9 01/26/2013 1254   MCH 30.3 01/14/2020 1440   MCHC 33.5 01/14/2020 1440   RDW 12.4 01/14/2020 1440   LYMPHSABS 2.6 09/29/2010 2228   MONOABS 0.7 09/29/2010 2228   EOSABS 0.3 09/29/2010 2228   BASOSABS 0.0 09/29/2010 2228    No results found for: POCLITH, LITHIUM   No results found for: PHENYTOIN, PHENOBARB, VALPROATE, CBMZ   .res Assessment: Plan:    Plan:  Lunesta 66m at hs   Lamictal 2086mat hs and 5063mvery morning. Seroquel 85m58m3 to 4 at bedtime   Add Adderall 20mg23mry morning  Stop Vyvanse 20mg 32my  Stop Adderall XR 20mg d64m   Recent BP check at PCP - WNL  RTC 4 weeks  Patient advised to contact office with any questions, adverse effects, or acute worsening in signs and symptoms.  Discussed potential benefits, risk, and side effects of benzodiazepines to include potential risk of tolerance and dependence, as well as possible drowsiness.  Advised patient not to drive if experiencing drowsiness and to take lowest possible effective dose to minimize risk of dependence and tolerance.  Counseled patient regarding potential benefits, risks, and side effects of Lamictal to include potential risk of Stevens-Johnson syndrome. Advised patient to stop taking Lamictal and contact office immediately if rash develops and to seek urgent medical attention if rash is severe and/or spreading quickly.  Discussed potential metabolic side effects associated with atypical antipsychotics, as well as potential risk for movement side effects. Advised pt to contact  office if movement side effects  occur.   Diagnoses and all orders for this visit:  Adjustment disorder with mixed anxiety and depressed mood  Attention deficit hyperactivity disorder (ADHD), combined type  Generalized anxiety disorder  Insomnia, unspecified type  Major depressive disorder, recurrent episode, moderate ([place])  Other orders -     amphetamine-dextroamphetamine (ADDERALL) 20 MG tablet; Take 1 tablet (20 mg total) by mouth daily.    Please see After Visit Summary for patient specific instructions.  No future appointments.  No orders of the defined types were placed in this encounter.     -------------------------------

## 2020-04-08 ENCOUNTER — Telehealth: Payer: Self-pay | Admitting: Adult Health

## 2020-04-08 ENCOUNTER — Other Ambulatory Visit: Payer: Self-pay | Admitting: Adult Health

## 2020-04-08 DIAGNOSIS — F902 Attention-deficit hyperactivity disorder, combined type: Secondary | ICD-10-CM

## 2020-04-08 MED ORDER — AMPHETAMINE-DEXTROAMPHETAMINE 30 MG PO TABS: 30.0000 mg | ORAL_TABLET | Freq: Every day | ORAL | 0 refills | Status: DC

## 2020-04-08 NOTE — Telephone Encounter (Signed)
Script sent  

## 2020-04-08 NOTE — Telephone Encounter (Signed)
Does she want to increase to 30mg  day.

## 2020-04-08 NOTE — Telephone Encounter (Signed)
Pt left message stating Adderall not strong enough.Not noticing any change. [phone] contact #

## 2020-04-08 NOTE — Telephone Encounter (Signed)
Pt returned call stating yes, wants to increase to 30 mg. Frederick Peers

## 2020-04-08 NOTE — Telephone Encounter (Signed)
Left message to call back and let us know if she wants to increase to 30 mg

## 2020-04-22 ENCOUNTER — Other Ambulatory Visit: Payer: Self-pay | Admitting: Adult Health

## 2020-04-22 DIAGNOSIS — G4700 Insomnia, unspecified: Secondary | ICD-10-CM

## 2020-05-14 ENCOUNTER — Ambulatory Visit (INDEPENDENT_AMBULATORY_CARE_PROVIDER_SITE_OTHER): Payer: Medicare PPO | Admitting: Psychology

## 2020-05-14 DIAGNOSIS — F411 Generalized anxiety disorder: Secondary | ICD-10-CM

## 2020-05-18 ENCOUNTER — Other Ambulatory Visit: Payer: Self-pay

## 2020-05-18 ENCOUNTER — Telehealth: Payer: Self-pay | Admitting: Adult Health

## 2020-05-18 DIAGNOSIS — F902 Attention-deficit hyperactivity disorder, combined type: Secondary | ICD-10-CM

## 2020-05-18 NOTE — Telephone Encounter (Signed)
Last refill 04/08/20 Pended for Barnett Applebaum to send

## 2020-05-18 NOTE — Telephone Encounter (Signed)
Pt lm requesting a refill on her Adderall.Last seen 7/22 Telehealth. Fill Rx at the [facility].

## 2020-05-19 MED ORDER — AMPHETAMINE-DEXTROAMPHETAMINE 30 MG PO TABS: 30.0000 mg | ORAL_TABLET | Freq: Every day | ORAL | 0 refills | Status: DC

## 2020-05-28 ENCOUNTER — Ambulatory Visit (INDEPENDENT_AMBULATORY_CARE_PROVIDER_SITE_OTHER): Payer: Medicare PPO | Admitting: Psychology

## 2020-05-28 DIAGNOSIS — F411 Generalized anxiety disorder: Secondary | ICD-10-CM

## 2020-06-17 ENCOUNTER — Ambulatory Visit (INDEPENDENT_AMBULATORY_CARE_PROVIDER_SITE_OTHER): Payer: Medicare PPO | Admitting: Adult Health

## 2020-06-17 ENCOUNTER — Encounter: Payer: Self-pay | Admitting: Adult Health

## 2020-06-17 ENCOUNTER — Other Ambulatory Visit: Payer: Self-pay

## 2020-06-17 ENCOUNTER — Ambulatory Visit: Payer: Medicare PPO | Admitting: Psychology

## 2020-06-17 DIAGNOSIS — F331 Major depressive disorder, recurrent, moderate: Secondary | ICD-10-CM

## 2020-06-17 DIAGNOSIS — F411 Generalized anxiety disorder: Secondary | ICD-10-CM | POA: Diagnosis not present

## 2020-06-17 DIAGNOSIS — F902 Attention-deficit hyperactivity disorder, combined type: Secondary | ICD-10-CM | POA: Diagnosis not present

## 2020-06-17 DIAGNOSIS — G4700 Insomnia, unspecified: Secondary | ICD-10-CM

## 2020-06-17 MED ORDER — AMPHETAMINE-DEXTROAMPHETAMINE 30 MG PO TABS: 30.0000 mg | ORAL_TABLET | Freq: Every day | ORAL | 0 refills | Status: DC

## 2020-06-17 MED ORDER — QUETIAPINE FUMARATE 50 MG PO TABS: ORAL_TABLET | ORAL | 3 refills | Status: DC

## 2020-06-17 MED ORDER — LAMOTRIGINE 25 MG PO TABS: ORAL_TABLET | ORAL | 3 refills | Status: DC

## 2020-06-17 MED ORDER — LAMOTRIGINE 200 MG PO TABS: 200.0000 mg | ORAL_TABLET | Freq: Every day | ORAL | 3 refills | Status: DC

## 2020-06-17 MED ORDER — DIAZEPAM 5 MG PO TABS: ORAL_TABLET | ORAL | 2 refills | Status: DC

## 2020-06-17 NOTE — Progress Notes (Signed)
Debra Buchanan [id number] 02/24/1956 64 y.o.  Subjective:   Patient ID:  Debra Buchanan is a 64 y.o. (DOB 08-Mar-1956) female.  Chief Complaint: No chief complaint on file.   HPI [name] presents to the office today for follow-up of adjustment issues, BPD, ADHD, anxiety, depression, and insomnia.  Describes mood today as "not doing well". Pleasant. Tearful at times. Mood symptoms - reports depression, anxiety, and irritability. Stating "I'm very frustrated". Increased stress in work setting - having chest pains. School going through accreditation and staff are having increased responsibilities. Going into work and not knowing when she will get off. Trying to "calm" herself into getting up and going to work everyday. Feels overwhelmed. Stating "I try to have a good day and be positive". 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 Stating the "expectations are unrealistic". Stable interest and motivation. Taking medications as prescribed. Seeing therapist. Energy levels decreased. Active, unable to exercise with current work schedule.  Enjoys some usual interests and activities. Single. Lives alone. Has 2 daughters - [place] and [place]. Talking with family and friends.  Appetite adequate. Weight stable.  Sleeps better some nights than others. Averages 6 to 8 hours. Focus and concentration improved with Adderall. Completing tasks. Managing aspects of household. Works as a Animal nutritionist.  Denies SI or HI. Denies AH or VH.  Review of Systems:  Review of Systems  Musculoskeletal: Negative for gait problem.  Neurological: Negative for tremors.  Psychiatric/Behavioral:       Please refer to HPI    Medications: I have reviewed the patient's current medications.  Current Outpatient Medications  Medication Sig Dispense Refill  . amphetamine-dextroamphetamine (ADDERALL) 30 MG tablet Take 1 tablet by mouth daily. 30 tablet 0  . aspirin EC 81 MG tablet Take 81 mg by mouth daily.    .  D3-50 1.25 MG (50000 UT) capsule Take 50,000 Units by mouth once a week.    . diazepam (VALIUM) 5 MG tablet Take one tablet daily. 30 tablet 2  . Eszopiclone 3 MG TABS TAKE ONE TABLET (3MG TOTAL) BY MOUTH AT BEDTIME. TAKE IMMEDIATELY BEFORE BEDTIME. 30 tablet 2  . fexofenadine (ALLEGRA) 180 MG tablet Take 180 mg by mouth daily.    . fluticasone (FLONASE) 50 MCG/ACT nasal spray Place 1 spray into both nostrils in the morning.     . lamoTRIgine (LAMICTAL) 200 MG tablet Take 1 tablet (200 mg total) by mouth at bedtime. 90 tablet 3  . lamoTRIgine (LAMICTAL) 25 MG tablet Take two tablets every morning. 180 tablet 3  . prednisoLONE acetate (PRED FORTE) 1 % ophthalmic suspension prednisolone acetate 1 % eye drops,suspension    . QUEtiapine (SEROQUEL) 50 MG tablet Take one to three tablets at bedtime. 270 tablet 3  . Valbenazine Tosylate (INGREZZA) 80 MG CAPS Take 1 capsule by mouth at bedtime.     No current facility-administered medications for this visit.    Medication Side Effects: None  Allergies:  Allergies  Allergen Reactions  . Grass Extracts  [Gramineae Pollens] Cough, Hives, Itching, Shortness Of Breath and Swelling  . Shellfish Allergy Cough, Hives, Itching, Rash, Shortness Of Breath and Swelling  . Iodine Swelling  . Penicillins     Unsure of reaction    Past Medical History:  Diagnosis Date  . Allergy   . Anemia   . Anxiety   . Bipolar disorder ([place])     Family History  Problem Relation Age of Onset  . Breast cancer Mother   .  Emphysema Father   . Heart attack Paternal Grandmother   . Colon cancer Neg Hx   . Esophageal cancer Neg Hx   . Stomach cancer Neg Hx     Social History   Socioeconomic History  . Marital status: Divorced    Spouse name: Not on file  . Number of children: 2  . Years of education: Not on file  . Highest education level: Not on file  Occupational History  . Occupation: Fish farm manager: [employer]  Tobacco Use   . Smoking status: Never Smoker  . Smokeless tobacco: Never Used  Vaping Use  . Vaping Use: Never assessed  Substance and Sexual Activity  . Alcohol use: Yes    Alcohol/week: 2.0 standard drinks    Types: 2 drink(s) per week    Comment: occasional  . Drug use: No  . Sexual activity: Not on file  Other Topics Concern  . Not on file  Social History Narrative   Lives at home alone   Right-handed   Rare caffeine use   Social Determinants of Health   Financial Resource Strain:   . Difficulty of Paying Living Expenses: Not on file  Food Insecurity:   . Worried About Charity fundraiser in the Last Year: Not on file  . Ran Out of Food in the Last Year: Not on file  Transportation Needs:   . Lack of Transportation (Medical): Not on file  . Lack of Transportation (Non-Medical): Not on file  Physical Activity:   . Days of Exercise per Week: Not on file  . Minutes of Exercise per Session: Not on file  Stress:   . Feeling of Stress : Not on file  Social Connections:   . Frequency of Communication with Friends and Family: Not on file  . Frequency of Social Gatherings with Friends and Family: Not on file  . Attends Religious Services: Not on file  . Active Member of Clubs or Organizations: Not on file  . Attends Archivist Meetings: Not on file  . Marital Status: Not on file  Intimate Partner Violence:   . Fear of Current or Ex-Partner: Not on file  . Emotionally Abused: Not on file  . Physically Abused: Not on file  . Sexually Abused: Not on file    Past Medical History, Surgical history, Social history, and Family history were reviewed and updated as appropriate.   Please see review of systems for further details on the patient's review from today.   Objective:   Physical Exam:  There were no vitals taken for this visit.  Physical Exam Constitutional:      General: She is not in acute distress. Musculoskeletal:        General: No deformity.  Neurological:      Mental Status: She is alert and oriented to person, place, and time.     Coordination: Coordination normal.  Psychiatric:        Attention and Perception: Attention and perception normal. She does not perceive auditory or visual hallucinations.        Mood and Affect: Mood normal. Mood is not anxious or depressed. Affect is not labile, blunt, angry or inappropriate.        Speech: Speech normal.        Behavior: Behavior normal.        Thought Content: Thought content normal. Thought content is not paranoid or delusional. Thought content does not include homicidal or suicidal  ideation. Thought content does not include homicidal or suicidal plan.        Cognition and Memory: Cognition and memory normal.        Judgment: Judgment normal.     Comments: Insight intact     Lab Review:     Component Value Date/Time   NA 140 01/14/2020 1440   K 4.4 01/14/2020 1440   CL 104 01/14/2020 1440   CO2 30 01/14/2020 1440   GLUCOSE 89 01/14/2020 1440   BUN 16 01/14/2020 1440   CREATININE 0.89 01/14/2020 1440   CALCIUM 9.8 01/14/2020 1440   PROT 7.2 01/14/2020 1440   AST 14 01/14/2020 1440   ALT 19 01/14/2020 1440   BILITOT 0.6 01/14/2020 1440   GFRNONAA >60 09/29/2010 2228   GFRAA  09/29/2010 2228    >60        The eGFR has been calculated using the MDRD equation. This calculation has not been validated in all clinical situations. eGFR's persistently <60 mL/min signify possible Chronic Kidney Disease.       Component Value Date/Time   WBC 7.3 01/14/2020 1440   RBC 4.00 01/14/2020 1440   HGB 12.1 01/14/2020 1440   HCT 36.1 01/14/2020 1440   PLT 359 01/14/2020 1440   MCV 90.3 01/14/2020 1440   MCV 92.9 01/26/2013 1254   MCH 30.3 01/14/2020 1440   MCHC 33.5 01/14/2020 1440   RDW 12.4 01/14/2020 1440   LYMPHSABS 2.6 09/29/2010 2228   MONOABS 0.7 09/29/2010 2228   EOSABS 0.3 09/29/2010 2228   BASOSABS 0.0 09/29/2010 2228    No results found for: POCLITH, LITHIUM   No  results found for: PHENYTOIN, PHENOBARB, VALPROATE, CBMZ   .res Assessment: Plan:    Plan:  Lunesta 69m at hs   Lamictal 203mat hs and 5022mvery morning. Seroquel 40m32m3 to 4 at bedtime  Adderall 30mg59mry morning Valium 5mg d49my prn anxiety  Out of work 06/17/2020 through 06/19/2020.  Recent BP check at PCP - WNL  RTC 4 weeks  Patient advised to contact office with any questions, adverse effects, or acute worsening in signs and symptoms.  Discussed potential benefits, risk, and side effects of benzodiazepines to include potential risk of tolerance and dependence, as well as possible drowsiness.  Advised patient not to drive if experiencing drowsiness and to take lowest possible effective dose to minimize risk of dependence and tolerance.  Counseled patient regarding potential benefits, risks, and side effects of Lamictal to include potential risk of Stevens-Johnson syndrome. Advised patient to stop taking Lamictal and contact office immediately if rash develops and to seek urgent medical attention if rash is severe and/or spreading quickly.  Discussed potential metabolic side effects associated with atypical antipsychotics, as well as potential risk for movement side effects. Advised pt to contact office if movement side effects occur.    Diagnoses and all orders for this visit:  Major depressive disorder, recurrent episode, moderate (HCC) -     QUEtiapine (SEROQUEL) 50 MG tablet; Take one to three tablets at bedtime. -     lamoTRIgine (LAMICTAL) 25 MG tablet; Take two tablets every morning. -     lamoTRIgine (LAMICTAL) 200 MG tablet; Take 1 tablet (200 mg total) by mouth at bedtime.  Insomnia, unspecified type -     QUEtiapine (SEROQUEL) 50 MG tablet; Take one to three tablets at bedtime.  Generalized anxiety disorder -     lamoTRIgine (LAMICTAL) 25 MG tablet; Take two tablets every morning. -  lamoTRIgine (LAMICTAL) 200 MG tablet; Take 1 tablet (200 mg total) by  mouth at bedtime. -     diazepam (VALIUM) 5 MG tablet; Take one tablet daily.  Attention deficit hyperactivity disorder (ADHD), combined type -     amphetamine-dextroamphetamine (ADDERALL) 30 MG tablet; Take 1 tablet by mouth daily.     Please see After Visit Summary for patient specific instructions.  No future appointments.  No orders of the defined types were placed in this encounter.   -------------------------------

## 2020-07-15 ENCOUNTER — Encounter: Payer: Self-pay | Admitting: Adult Health

## 2020-07-15 ENCOUNTER — Ambulatory Visit (INDEPENDENT_AMBULATORY_CARE_PROVIDER_SITE_OTHER): Payer: Medicare PPO | Admitting: Adult Health

## 2020-07-15 ENCOUNTER — Other Ambulatory Visit: Payer: Self-pay

## 2020-07-15 DIAGNOSIS — F902 Attention-deficit hyperactivity disorder, combined type: Secondary | ICD-10-CM | POA: Diagnosis not present

## 2020-07-15 DIAGNOSIS — F411 Generalized anxiety disorder: Secondary | ICD-10-CM | POA: Diagnosis not present

## 2020-07-15 DIAGNOSIS — G4700 Insomnia, unspecified: Secondary | ICD-10-CM | POA: Diagnosis not present

## 2020-07-15 DIAGNOSIS — F331 Major depressive disorder, recurrent, moderate: Secondary | ICD-10-CM | POA: Diagnosis not present

## 2020-07-15 MED ORDER — ESZOPICLONE 3 MG PO TABS: ORAL_TABLET | ORAL | 2 refills | Status: DC

## 2020-07-15 NOTE — Progress Notes (Signed)
Debra Buchanan [id number] 10-12-1955 64 y.o.  Subjective:   Patient ID:  Debra Buchanan is a 64 y.o. (DOB 1956-08-11) female.  Chief Complaint: No chief complaint on file.   HPI DIAMOND JENTZ presents to the office today for follow-up of adjustment issues, BPD, ADHD, anxiety, depression, and insomnia.  Describes mood today as "better". Pleasant. Tearful at times. Mood symptoms - reports decreased depression, anxiety, and irritability. Stating "I'm doing alright". Work is "still stressful". Typically enjoys what she does, but feels it's more like a "job". Looking forward to the holidays. Visiting daughters in [place] and [place]. Stable interest and motivation. Taking medications as prescribed. Seeing therapist. Energy levels improved. Active, does not have a regular exercise routine.  Enjoys some usual interests and activities. Single. Lives alone. Has 2 daughters. Talking with family and friends.  Appetite adequate. Weight stable.  Sleeps better some nights than others. Averages 6 to 8 hours. Focus and concentration improved. Completing tasks. Managing aspects of household. Works as a Animal nutritionist.  Denies SI or HI. Denies AH or VH.  Review of Systems:  Review of Systems  Musculoskeletal: Negative for gait problem.  Neurological: Negative for tremors.  Psychiatric/Behavioral:       Please refer to HPI    Medications: I have reviewed the patient's current medications.  Current Outpatient Medications  Medication Sig Dispense Refill  . amphetamine-dextroamphetamine (ADDERALL) 30 MG tablet Take 1 tablet by mouth daily. 30 tablet 0  . aspirin EC 81 MG tablet Take 81 mg by mouth daily.    . D3-50 1.25 MG (50000 UT) capsule Take 50,000 Units by mouth once a week.    . diazepam (VALIUM) 5 MG tablet Take one tablet daily. 30 tablet 2  . Eszopiclone 3 MG TABS TAKE ONE TABLET (3MG TOTAL) BY MOUTH AT BEDTIME. TAKE IMMEDIATELY BEFORE BEDTIME. 30 tablet 2  . fexofenadine  (ALLEGRA) 180 MG tablet Take 180 mg by mouth daily.    . fluticasone (FLONASE) 50 MCG/ACT nasal spray Place 1 spray into both nostrils in the morning.     . lamoTRIgine (LAMICTAL) 200 MG tablet Take 1 tablet (200 mg total) by mouth at bedtime. 90 tablet 3  . lamoTRIgine (LAMICTAL) 25 MG tablet Take two tablets every morning. 180 tablet 3  . prednisoLONE acetate (PRED FORTE) 1 % ophthalmic suspension prednisolone acetate 1 % eye drops,suspension    . QUEtiapine (SEROQUEL) 50 MG tablet Take one to three tablets at bedtime. 270 tablet 3  . Valbenazine Tosylate (INGREZZA) 80 MG CAPS Take 1 capsule by mouth at bedtime.     No current facility-administered medications for this visit.    Medication Side Effects: None  Allergies:  Allergies  Allergen Reactions  . Grass Extracts  [Gramineae Pollens] Cough, Hives, Itching, Shortness Of Breath and Swelling  . Shellfish Allergy Cough, Hives, Itching, Rash, Shortness Of Breath and Swelling  . Iodine Swelling  . Penicillins     Unsure of reaction    Past Medical History:  Diagnosis Date  . Allergy   . Anemia   . Anxiety   . Bipolar disorder ([place])     Family History  Problem Relation Age of Onset  . Breast cancer Mother   . Emphysema Father   . Heart attack Paternal Grandmother   . Colon cancer Neg Hx   . Esophageal cancer Neg Hx   . Stomach cancer Neg Hx     Social History   Socioeconomic History  . Marital status: Divorced  Spouse name: Not on file  . Number of children: 2  . Years of education: Not on file  . Highest education level: Not on file  Occupational History  . Occupation: Fish farm manager: [employer]  Tobacco Use  . Smoking status: Never Smoker  . Smokeless tobacco: Never Used  Vaping Use  . Vaping Use: Never assessed  Substance and Sexual Activity  . Alcohol use: Yes    Alcohol/week: 2.0 standard drinks    Types: 2 drink(s) per week    Comment: occasional  . Drug use: No  .  Sexual activity: Not on file  Other Topics Concern  . Not on file  Social History Narrative   Lives at home alone   Right-handed   Rare caffeine use   Social Determinants of Health   Financial Resource Strain:   . Difficulty of Paying Living Expenses: Not on file  Food Insecurity:   . Worried About Charity fundraiser in the Last Year: Not on file  . Ran Out of Food in the Last Year: Not on file  Transportation Needs:   . Lack of Transportation (Medical): Not on file  . Lack of Transportation (Non-Medical): Not on file  Physical Activity:   . Days of Exercise per Week: Not on file  . Minutes of Exercise per Session: Not on file  Stress:   . Feeling of Stress : Not on file  Social Connections:   . Frequency of Communication with Friends and Family: Not on file  . Frequency of Social Gatherings with Friends and Family: Not on file  . Attends Religious Services: Not on file  . Active Member of Clubs or Organizations: Not on file  . Attends Archivist Meetings: Not on file  . Marital Status: Not on file  Intimate Partner Violence:   . Fear of Current or Ex-Partner: Not on file  . Emotionally Abused: Not on file  . Physically Abused: Not on file  . Sexually Abused: Not on file    Past Medical History, Surgical history, Social history, and Family history were reviewed and updated as appropriate.   Please see review of systems for further details on the patient's review from today.   Objective:   Physical Exam:  There were no vitals taken for this visit.  Physical Exam Constitutional:      General: She is not in acute distress. Musculoskeletal:        General: No deformity.  Neurological:     Mental Status: She is alert and oriented to person, place, and time.     Coordination: Coordination normal.  Psychiatric:        Attention and Perception: Attention and perception normal. She does not perceive auditory or visual hallucinations.        Mood and Affect:  Mood normal. Mood is not anxious or depressed. Affect is not labile, blunt, angry or inappropriate.        Speech: Speech normal.        Behavior: Behavior normal.        Thought Content: Thought content normal. Thought content is not paranoid or delusional. Thought content does not include homicidal or suicidal ideation. Thought content does not include homicidal or suicidal plan.        Cognition and Memory: Cognition and memory normal.        Judgment: Judgment normal.     Comments: Insight intact     Lab Review:  Component Value Date/Time   NA 140 01/14/2020 1440   K 4.4 01/14/2020 1440   CL 104 01/14/2020 1440   CO2 30 01/14/2020 1440   GLUCOSE 89 01/14/2020 1440   BUN 16 01/14/2020 1440   CREATININE 0.89 01/14/2020 1440   CALCIUM 9.8 01/14/2020 1440   PROT 7.2 01/14/2020 1440   AST 14 01/14/2020 1440   ALT 19 01/14/2020 1440   BILITOT 0.6 01/14/2020 1440   GFRNONAA >60 09/29/2010 2228   GFRAA  09/29/2010 2228    >60        The eGFR has been calculated using the MDRD equation. This calculation has not been validated in all clinical situations. eGFR's persistently <60 mL/min signify possible Chronic Kidney Disease.       Component Value Date/Time   WBC 7.3 01/14/2020 1440   RBC 4.00 01/14/2020 1440   HGB 12.1 01/14/2020 1440   HCT 36.1 01/14/2020 1440   PLT 359 01/14/2020 1440   MCV 90.3 01/14/2020 1440   MCV 92.9 01/26/2013 1254   MCH 30.3 01/14/2020 1440   MCHC 33.5 01/14/2020 1440   RDW 12.4 01/14/2020 1440   LYMPHSABS 2.6 09/29/2010 2228   MONOABS 0.7 09/29/2010 2228   EOSABS 0.3 09/29/2010 2228   BASOSABS 0.0 09/29/2010 2228    No results found for: POCLITH, LITHIUM   No results found for: PHENYTOIN, PHENOBARB, VALPROATE, CBMZ   .res Assessment: Plan:    Plan:  Lunesta 24m at hs   Lamictal 2059mat hs and 5028mvery morning. Seroquel 67m36m3 to 4 at bedtime  Adderall 30mg17mry morning Valium 5mg d30my prn anxiety  Recent BP check at  PCP - WNL  RTC 4 weeks  Patient advised to contact office with any questions, adverse effects, or acute worsening in signs and symptoms.  Discussed potential benefits, risk, and side effects of benzodiazepines to include potential risk of tolerance and dependence, as well as possible drowsiness.  Advised patient not to drive if experiencing drowsiness and to take lowest possible effective dose to minimize risk of dependence and tolerance.  Counseled patient regarding potential benefits, risks, and side effects of Lamictal to include potential risk of Stevens-Johnson syndrome. Advised patient to stop taking Lamictal and contact office immediately if rash develops and to seek urgent medical attention if rash is severe and/or spreading quickly.  Discussed potential metabolic side effects associated with atypical antipsychotics, as well as potential risk for movement side effects. Advised pt to contact office if movement side effects occur.    Diagnoses and all orders for this visit:  Insomnia, unspecified type -     Eszopiclone 3 MG TABS; TAKE ONE TABLET (3MG TOTAL) BY MOUTH AT BEDTIME. TAKE IMMEDIATELY BEFORE BEDTIME.  Major depressive disorder, recurrent episode, moderate (HCC)  Generalized anxiety disorder     Please see After Visit Summary for patient specific instructions.  No future appointments.  No orders of the defined types were placed in this encounter.   -------------------------------

## 2020-07-27 ENCOUNTER — Other Ambulatory Visit: Payer: Self-pay | Admitting: Adult Health

## 2020-07-27 DIAGNOSIS — F902 Attention-deficit hyperactivity disorder, combined type: Secondary | ICD-10-CM

## 2020-08-09 ENCOUNTER — Ambulatory Visit (INDEPENDENT_AMBULATORY_CARE_PROVIDER_SITE_OTHER): Payer: Medicare PPO

## 2020-08-09 ENCOUNTER — Other Ambulatory Visit: Payer: Self-pay

## 2020-08-09 ENCOUNTER — Encounter (HOSPITAL_COMMUNITY): Payer: Self-pay

## 2020-08-09 ENCOUNTER — Ambulatory Visit (HOSPITAL_COMMUNITY)
Admission: EM | Admit: 2020-08-09 | Discharge: 2020-08-09 | Disposition: A | Discharge status: Home / Self Care | Payer: Medicare PPO | Attending: Family Medicine | Admitting: Family Medicine

## 2020-08-09 DIAGNOSIS — M25551 Pain in right hip: Secondary | ICD-10-CM | POA: Diagnosis not present

## 2020-08-09 DIAGNOSIS — R1031 Right lower quadrant pain: Secondary | ICD-10-CM | POA: Insufficient documentation

## 2020-08-09 DIAGNOSIS — R52 Pain, unspecified: Secondary | ICD-10-CM | POA: Diagnosis present

## 2020-08-09 LAB — POCT URINALYSIS DIPSTICK, ED / UC
Glucose, UA: NEGATIVE mg/dL
Leukocytes,Ua: NEGATIVE
Nitrite: NEGATIVE
Protein, ur: 30 mg/dL — AB
Specific Gravity, Urine: >=1.03 (ref 1.005–1.030)
Urobilinogen, UA: 0.2 mg/dL (ref 0.0–1.0)
pH: 5 (ref 5.0–8.0)

## 2020-08-09 LAB — CBC
HCT: 37 % (ref 36.0–46.0)
Hemoglobin: 12.2 g/dL (ref 12.0–15.0)
MCH: 29.8 pg (ref 26.0–34.0)
MCHC: 33 g/dL (ref 30.0–36.0)
MCV: 90.5 fL (ref 80.0–100.0)
Platelets: 341 10*3/uL (ref 150–400)
RBC: 4.09 MIL/uL (ref 3.87–5.11)
RDW: 12.7 % (ref 11.5–15.5)
WBC: 7.2 10*3/uL (ref 4.0–10.5)
nRBC: 0 % (ref 0.0–0.2)

## 2020-08-09 MED ORDER — KETOROLAC TROMETHAMINE 30 MG/ML IJ SOLN
INTRAMUSCULAR | Status: AC
  Filled 2020-08-09: qty 1

## 2020-08-09 MED ORDER — KETOROLAC TROMETHAMINE 30 MG/ML IJ SOLN
30.0000 mg | Freq: Once | INTRAMUSCULAR | Status: AC
  Administered 2020-08-09: 30 mg via INTRAMUSCULAR

## 2020-08-09 NOTE — ED Provider Notes (Signed)
[place]    CSN: [id number] Arrival date & time: 08/09/20  1300    History   Chief Complaint Chief Complaint  Patient presents with  . Abdominal Pain    Lower    HPI Debra Buchanan is a 64 y.o. female presents to urgent care today with complaints of lower abdominal pain.  Patient reports pain in right lower abdomen and hip x2 weeks.  Pain has been constant and unchanged, worse with any movement and in the evening.  She denies any injury or fall. She reports decrease in appetite.  Patient denies any recent fever, chills, dysuria, N/V, diarrhea.  Patient was seen by PCP on Tuesday and symptoms felt related to constipation though persist despite treatment.  Patient has also tried Tylenol without relief.   Past Medical History:  Diagnosis Date  . Allergy   . Anemia   . Anxiety   . Bipolar disorder [facility])     Patient Active Problem List   Diagnosis Date Noted  . Minimal cognitive impairment 10/17/2019  . Taking medication for chronic disease 10/17/2019  . Abnormal involuntary movement 03/23/2016  . Tardive dyskinesia 08/29/2005  . Bipolar disorder ([place]) 08/29/2001    Past Surgical History:  Procedure Laterality Date  . CARPAL TUNNEL RELEASE  2001  . [place]  . TUBAL LIGATION      OB History   No obstetric history on file.      Home Medications    Prior to Admission medications   Medication Sig Start Date End Date Taking? Authorizing Provider  linaCLOtide (LINZESS PO) Take by mouth.   Yes [provider]  amphetamine-dextroamphetamine (ADDERALL) 30 MG tablet TAKE ONE (1) TABLET BY MOUTH EVERY DAY 07/27/20   Mozingo, Berdie Ogren, NP  aspirin EC 81 MG tablet Take 81 mg by mouth daily.    [provider]  D3-50 1.25 MG (50000 UT) capsule Take 50,000 Units by mouth once a week. 10/29/19   [provider]  diazepam (VALIUM) 5 MG tablet Take one tablet daily. 06/17/20   Mozingo, Berdie Ogren, NP   Eszopiclone 3 MG TABS TAKE ONE TABLET (3MG  TOTAL) BY MOUTH AT BEDTIME. TAKE IMMEDIATELY BEFORE BEDTIME. 07/15/20   Mozingo, Berdie Ogren, NP  fexofenadine (ALLEGRA) 180 MG tablet Take 180 mg by mouth daily.    [provider]  fluticasone (FLONASE) 50 MCG/ACT nasal spray Place 1 spray into both nostrils in the morning.     [provider]  lamoTRIgine (LAMICTAL) 200 MG tablet Take 1 tablet (200 mg total) by mouth at bedtime. 06/17/20   Mozingo, Berdie Ogren, NP  lamoTRIgine (LAMICTAL) 25 MG tablet Take two tablets every morning. 06/17/20   Mozingo, Berdie Ogren, NP  prednisoLONE acetate (PRED FORTE) 1 % ophthalmic suspension prednisolone acetate 1 % eye drops,suspension    [provider]  QUEtiapine (SEROQUEL) 50 MG tablet Take one to three tablets at bedtime. 06/17/20   Mozingo, Berdie Ogren, NP  Valbenazine Tosylate [facility]) 80 MG CAPS Take 1 capsule by mouth at bedtime.    [provider]    Family History Family History  Problem Relation Age of Onset  . Breast cancer Mother   . Emphysema Father   . Heart attack Paternal Grandmother   . Colon cancer Neg Hx   . Esophageal cancer Neg Hx   . Stomach cancer Neg Hx     Social History Social History   Tobacco Use  . Smoking status: Never Smoker  .  Smokeless tobacco: Never Used  Substance Use Topics  . Alcohol use: Yes    Alcohol/week: 2.0 standard drinks    Types: 2 drink(s) per week    Comment: occasional  . Drug use: No     Allergies   Grass extracts  [gramineae pollens], Shellfish allergy, Iodine, and Penicillins   Review of Systems As stated in HPI otherwise negative   Physical Exam Triage Vital Signs ED Triage Vitals [08/09/20 1318]  Enc Vitals Group     BP (!) 155/78     Pulse Rate 82     Resp 17     Temp 97.7 F (36.5 C)     Temp Source Oral     SpO2 100 %     Weight      Height      Head Circumference      Peak Flow      Pain Score 7     Pain Loc       Pain Edu?      Excl. in [place]?    No data found.  Updated Vital Signs BP 126/73 (BP Location: Left Arm)   Pulse 78   Temp 98.3 F (36.8 C)   Resp 18   SpO2 100%   Visual Acuity Right Eye Distance:   Left Eye Distance:   Bilateral Distance:    Right Eye Near:   Left Eye Near:    Bilateral Near:     Physical Exam Constitutional:      General: She is not in acute distress.    Appearance: She is well-developed. She is not ill-appearing.  Cardiovascular:     Rate and Rhythm: Normal rate and regular rhythm.  Pulmonary:     Effort: Pulmonary effort is normal.     Breath sounds: Normal breath sounds.  Abdominal:     General: Bowel sounds are normal. There is no distension.     Palpations: Abdomen is soft.     Tenderness: There is no right CVA tenderness, left CVA tenderness, guarding or rebound. Negative signs include Murphy's sign and McBurney's sign.     Hernia: No hernia is present.  Musculoskeletal:     Comments: Patient with tenderness upon palpation of right inguinal region on psoas muscle.  No hernia.  No rebound or guarding  Skin:    General: Skin is warm and dry.  Neurological:     General: No focal deficit present.     Mental Status: She is alert.  Psychiatric:        Mood and Affect: Mood normal.        Behavior: Behavior normal.       UC Treatments / Results  Labs (all labs ordered are listed, but only abnormal results are displayed) Labs Reviewed  POCT URINALYSIS DIPSTICK, ED / UC - Abnormal; Notable for the following components:      Result Value   Bilirubin Urine SMALL (*)    Ketones, ur TRACE (*)    Hgb urine dipstick SMALL (*)    Protein, ur 30 (*)    All other components within normal limits  CBC    EKG   Radiology No results found.  Procedures Procedures (including critical care time)  Medications Ordered in UC Medications  ketorolac (TORADOL) 30 MG/ML injection 30 mg (30 mg Intramuscular Given 08/09/20 1511)    Initial  Impression / Assessment and Plan / UC Course  I have reviewed the triage vital signs and the nursing notes.  Pertinent labs &  imaging results that were available during my care of the patient were reviewed by me and considered in my medical decision making (see chart for details).  Groin pain, right -Initial presentation of abdominal pain to PCP earlier this week.  Treated for constipation without relief of symptoms -Exam actually suspicious for musculoskeletal cause of patient's pain.  WBC wnl therefore low suspicion of infectious etiology. -X-ray of hip unremarkable -Consider psoas muscle irritation.  Limited testing due to discomfort -Toradol in office for pain -Okay to take Motrin or Aleve as needed starting tomorrow -Follow-up PCP and return for any worsening symptoms  Reviewed expections re: course of current medical issues. Questions answered. Outlined signs and symptoms indicating need for more acute intervention. Pt verbalized understanding. AVS given  Final Clinical Impressions(s) / UC Diagnoses   Final diagnoses:  Groin pain, right     Discharge Instructions     I am uncertain what is causing your lower abdominal/groin pain.  It is difficult to tell whether your symptoms are musculoskeletal or GI related.  The x-ray of your hip is negative.  I have checked labs to make sure there is no infection.  I will follow up with these labs later today.  You have been given a shot of Toradol in the office.  Please do not take any more ibuprofen, Aleve or Advil until tomorrow.  Please call your primary care to make an appointment for follow-up    ED Prescriptions    None     PDMP not reviewed this encounter.   Rudolpho Sevin, NP 08/11/20 [phone]

## 2020-08-09 NOTE — ED Triage Notes (Signed)
Pt states that she has a pain in her lower right abdominal started two weeks ago. Pt states she tried tylenol and it did not touch the pain at all. Any movement intensifies the pain.

## 2020-08-09 NOTE — ED Triage Notes (Signed)
Pt present lower abdominal pain. Symptom started two weeks ago. Pt states that it takes a few minutes to get the stream going to urinate. Pt state that its the RLQ area with tenderness.

## 2020-08-09 NOTE — Discharge Instructions (Addendum)
I am uncertain what is causing your lower abdominal/groin pain.  It is difficult to tell whether your symptoms are musculoskeletal or GI related.  The x-ray of your hip is negative.  I have checked labs to make sure there is no infection.  I will follow up with these labs later today.  You have been given a shot of Toradol in the office.  Please do not take any more ibuprofen, Aleve or Advil until tomorrow.  Please call your primary care to make an appointment for follow-up

## 2020-08-25 ENCOUNTER — Other Ambulatory Visit: Payer: Self-pay | Admitting: Adult Health

## 2020-08-25 DIAGNOSIS — F902 Attention-deficit hyperactivity disorder, combined type: Secondary | ICD-10-CM

## 2020-09-03 ENCOUNTER — Ambulatory Visit: Payer: Medicare PPO | Admitting: Nurse Practitioner

## 2020-09-03 ENCOUNTER — Encounter: Payer: Self-pay | Admitting: Nurse Practitioner

## 2020-09-03 VITALS — BP 124/88 | HR 76 | Ht 63.78 in | Wt 169.2 lb

## 2020-09-03 DIAGNOSIS — R1011 Right upper quadrant pain: Secondary | ICD-10-CM

## 2020-09-03 DIAGNOSIS — R6881 Early satiety: Secondary | ICD-10-CM | POA: Diagnosis not present

## 2020-09-03 DIAGNOSIS — R634 Abnormal weight loss: Secondary | ICD-10-CM | POA: Diagnosis not present

## 2020-09-03 DIAGNOSIS — K5900 Constipation, unspecified: Secondary | ICD-10-CM

## 2020-09-03 MED ORDER — PLENVU 140 G PO SOLR
1.0000 | ORAL | 0 refills | Status: DC
Start: 2020-09-03 — End: 2023-06-26

## 2020-09-03 NOTE — Progress Notes (Signed)
ASSESSMENT AND PLAN    # 65 yo female with unintentional weight loss of ~ 20 pounds since June, RLQ discomfort, early satiety, and new bowel changes (constipation). She developed constipation after starting oral iron but it has persisted despite discontinuation of iron several weeks ago. Etiology of symptoms unclear but need to rule out upper / lower gastrointestinal neoplasm.  --Normal TSH in May 2021 --Start Miralax 1 capful in 8 oz daily --Try to increase water intake to at least 48 oz daily --For for further evaluation will schedule patient for  EGD and colonoscopy.  The risks and benefits of EGD and colonoscopy with possible polypectomy / biopsies were discussed and the patient agrees to proceed.  --If EGD / colonoscopy negative and weight loss continues consider imaging.    # Chronic intermittent [place] anemia ( for years). Recent recurrent anemia for which Neurology started her on oral iron. She stopped it after two weeks because of constipation. Hgb on 08/09/20 was 12.2   # Tardive dyskinesia, on Ingrezza  HISTORY OF PRESENT ILLNESS    Primary Gastroenterologist : Previously Dr. Deatra Ina  Chief Complaint : Right lower abdominal discomfort, constipation, weight loss, gets full easy, no appetite  Debra Buchanan is a 65 y.o. female with PMH / [place] significant for,  but not necessarily limited to: Tardive dyskinesia, bipolar disorder, diverticulosis, anxiety, depression, ADHD.  Patient sees Neurology for history of tardive dyskinesia for which she takes Ingrezza. Neurology started her on iron several weeks ago. She stopped the iron after a couple of weeks due to constipation. She gives a history of longstanding anemia. She hasn't seen any blood in her stool. Takes a baby asa, no other NSAIDS.Recent hgb was normal.   Patient seen at [facility] on 12/12 for two week history of right groin pain.. Right hip xray was unremarkable. CBC normal.  Diagnosed with musculoskeletal pain.  Patient recalls  that the pain started in her right lower back but eventually moved around to RLQ. The pain became constant. She cannot recall if it got worse with activity but eating made it worse.  Tylenol didn't help. She had no associated urinary symptoms. She is up to date on GYN evaluation. She sees a Restaurant manager, fast food on a regular basis but a back adjustment didn't help the pain.  PCP thought the pain may be secondary to constipation. Patient took 10 days of Linzess with good results but no improvement in pain. The pain did spontaneously resolve last week.  Debra Buchanan is still struggling with constipation weeks after stopping oral iron. She has sensation of incomplete evacuation . She recently started Aricept. No other recent medication changes other than the iron.    Patient complains of a diminished appetitie and early satiety. No nausea or vomiting. She has unintentionally lost about 20 pounds since June.    Previous Endoscopic Evaluations / Pertinent Studies:  March 2014 screening colonoscopy  --diverticulosis and a 10 mm polyp ( non-adenomatous )  Past Medical History:  Diagnosis Date  . ADHD   . Allergy   . Anemia   . Anxiety   . Bipolar disorder ([place])   . Depression   . Insomnia   . Memory impairment      Past Surgical History:  Procedure Laterality Date  . CARPAL TUNNEL RELEASE Bilateral 2001  . [place]  . TUBAL LIGATION    . UMBILICAL HERNIA REPAIR     Family History  Problem Relation Age of Onset  . Breast  cancer Mother   . Emphysema Father   . Heart attack Paternal Grandfather   . Colon cancer Paternal Uncle   . Esophageal cancer Neg Hx   . Stomach cancer Neg Hx    Social History   Tobacco Use  . Smoking status: Never Smoker  . Smokeless tobacco: Never Used  Vaping Use  . Vaping Use: Never used  Substance Use Topics  . Alcohol use: Yes    Alcohol/week: 2.0 standard drinks    Types: 2 Standard drinks or equivalent per week    Comment: occasional, 1-2 per  week  . Drug use: No   Current Outpatient Medications  Medication Sig Dispense Refill  . amphetamine-dextroamphetamine (ADDERALL) 30 MG tablet TAKE ONE (1) TABLET BY MOUTH EVERY DAY 30 tablet 0  . aspirin EC 81 MG tablet Take 81 mg by mouth daily.    . clonazePAM (KLONOPIN) 0.5 MG tablet Take 0.5 mg by mouth 3 (three) times daily as needed for anxiety.    . diazepam (VALIUM) 5 MG tablet Take one tablet daily. 30 tablet 2  . donepezil (ARICEPT) 10 MG tablet Take 1 tablet by mouth daily.    . Eszopiclone 3 MG TABS TAKE ONE TABLET (3MG  TOTAL) BY MOUTH AT BEDTIME. TAKE IMMEDIATELY BEFORE BEDTIME. 30 tablet 2  . fexofenadine (ALLEGRA) 180 MG tablet Take 180 mg by mouth as needed.    . fluticasone (FLONASE) 50 MCG/ACT nasal spray Place 1 spray into both nostrils in the morning.     . lamoTRIgine (LAMICTAL) 200 MG tablet Take 1 tablet (200 mg total) by mouth at bedtime. 90 tablet 3  . lamoTRIgine (LAMICTAL) 25 MG tablet Take two tablets every morning. 180 tablet 3  . QUEtiapine (SEROQUEL) 50 MG tablet Take one to three tablets at bedtime. 270 tablet 3  . Valbenazine Tosylate (INGREZZA) 80 MG CAPS Take 1 capsule by mouth at bedtime.     No current facility-administered medications for this visit.   Allergies  Allergen Reactions  . Grass Extracts  [Gramineae Pollens] Cough, Hives, Itching, Shortness Of Breath and Swelling  . Shellfish Allergy Cough, Hives, Itching, Rash, Shortness Of Breath and Swelling  . Iodine Swelling  . Other Hives, Itching and Cough    Pine Nuts  . Penicillins     Unsure of reaction     Review of Systems: Positive for anxiety, night sweats, sleeping problems, excessive thirst. All other systems reviewed and negative except where noted in HPI.   PHYSICAL EXAM :    Wt Readings from Last 3 Encounters:  09/03/20 169 lb 4 oz (76.8 kg)  02/11/20 188 lb (85.3 kg)  01/13/20 190 lb 12.8 oz (86.5 kg)    BP 124/88 (BP Location: Left Arm, Patient Position: Sitting, Cuff  Size: Normal)   Pulse 76   Ht 5' 3.78" (1.62 m) Comment: height measured without shoes  Wt 169 lb 4 oz (76.8 kg)   BMI 29.25 kg/m  Constitutional:  Pleasant female in no acute distress. Psychiatric: Normal mood and affect. Behavior is normal. EENT: Pupils normal.  Conjunctivae are normal. No scleral icterus. Neck supple.  Cardiovascular: Normal rate, regular rhythm. No edema Pulmonary/chest: Effort normal and breath sounds normal. No wheezing, rales or rhonchi. Abdominal: Soft, nondistended, nontender. Bowel sounds active throughout. There are no masses palpable. No hepatomegaly. Neurological: Alert and oriented to person place and time. Skin: Skin is warm and dry. No rashes noted.  08-31-1991, NP  09/03/2020, 1:54 PM

## 2020-09-03 NOTE — Patient Instructions (Addendum)
If you are age 66 or older, your body mass index should be between 23-30. Your Body mass index is 29.25 kg/m. If this is out of the aforementioned range listed, please consider follow up with your Primary Care Provider.  If you are age 30 or younger, your body mass index should be between 19-25. Your Body mass index is 29.25 kg/m. If this is out of the aformentioned range listed, please consider follow up with your Primary Care Provider.   You have been scheduled for a colonoscopy. Please follow written instructions given to you at your visit today.  Please pick up your prep supplies at the pharmacy within the next 1-3 days. If you use inhalers (even only as needed), please bring them with you on the day of your procedure.  Follow up pending the results of your Colonoscopy/Endoscopy or as needed.  Thank you for entrusting me with your care and choosing [facility]  Willette Cluster, NP-C

## 2020-09-04 NOTE — Progress Notes (Signed)
Reviewed and agree with management plans. ? ?Kimberly L. Beavers, MD, MPH  ?

## 2020-09-09 ENCOUNTER — Ambulatory Visit (INDEPENDENT_AMBULATORY_CARE_PROVIDER_SITE_OTHER): Payer: Medicare PPO | Admitting: Psychology

## 2020-09-09 DIAGNOSIS — F411 Generalized anxiety disorder: Secondary | ICD-10-CM | POA: Diagnosis not present

## 2020-09-22 ENCOUNTER — Other Ambulatory Visit: Payer: Self-pay | Admitting: Adult Health

## 2020-09-22 DIAGNOSIS — F902 Attention-deficit hyperactivity disorder, combined type: Secondary | ICD-10-CM

## 2020-09-24 ENCOUNTER — Ambulatory Visit: Payer: Medicare PPO | Admitting: Gastroenterology

## 2020-10-07 ENCOUNTER — Telehealth: Payer: Self-pay | Admitting: Adult Health

## 2020-10-07 NOTE — Telephone Encounter (Signed)
Next visit is 10/14/20. Debra Buchanan is asking if she can get a work note for next week to be off due to work stressors. She will come pick the note up.

## 2020-10-07 NOTE — Telephone Encounter (Signed)
Rtc to patient and she reports a lot of stressors like she was dealing with this past October as previously discussed with Barnett Applebaum. Patient has apt next week on 10/14/2020 but needs to pick up a note before then. Please date for Monday 10/12/2020, she has some things she needs to finish this week only she can complete, contact patient when note is ready, leave her a vm if she doesn't answer tomorrow.

## 2020-10-07 NOTE — Telephone Encounter (Signed)
Can we call and follow up with her. Ok to take leave from work.

## 2020-10-08 ENCOUNTER — Encounter: Payer: Medicare PPO | Admitting: Gastroenterology

## 2020-10-08 NOTE — Telephone Encounter (Signed)
She says she will try to come today to pick it up.

## 2020-10-08 NOTE — Telephone Encounter (Signed)
Will leave not up front.

## 2020-10-08 NOTE — Telephone Encounter (Signed)
Debra Buchanan can you let patient know letter is ready

## 2020-10-08 NOTE — Telephone Encounter (Signed)
Noted  

## 2020-10-14 ENCOUNTER — Encounter: Payer: Self-pay | Admitting: Adult Health

## 2020-10-14 ENCOUNTER — Ambulatory Visit (INDEPENDENT_AMBULATORY_CARE_PROVIDER_SITE_OTHER): Payer: Medicare PPO | Admitting: Adult Health

## 2020-10-14 ENCOUNTER — Other Ambulatory Visit: Payer: Self-pay

## 2020-10-14 DIAGNOSIS — G4700 Insomnia, unspecified: Secondary | ICD-10-CM

## 2020-10-14 DIAGNOSIS — F411 Generalized anxiety disorder: Secondary | ICD-10-CM

## 2020-10-14 DIAGNOSIS — F902 Attention-deficit hyperactivity disorder, combined type: Secondary | ICD-10-CM

## 2020-10-14 DIAGNOSIS — F331 Major depressive disorder, recurrent, moderate: Secondary | ICD-10-CM | POA: Diagnosis not present

## 2020-10-14 MED ORDER — ESZOPICLONE 3 MG PO TABS: ORAL_TABLET | ORAL | 2 refills | Status: DC

## 2020-10-14 MED ORDER — DIAZEPAM 5 MG PO TABS: ORAL_TABLET | ORAL | 2 refills | Status: DC

## 2020-10-14 MED ORDER — AMPHETAMINE-DEXTROAMPHETAMINE 30 MG PO TABS: ORAL_TABLET | ORAL | 0 refills | Status: DC

## 2020-10-14 NOTE — Progress Notes (Signed)
ZILLAH ALEXIE [id number] 1955/11/26 65 y.o.  Subjective:   Patient ID:  Debra Buchanan is a 65 y.o. (DOB [birth year]) female.  Chief Complaint: No chief complaint on file.   HPI Debra Buchanan presents to the office today for follow-up of adjustment issues, BPD, ADHD, anxiety, depression, and insomnia.  Describes mood today as "improving". Pleasant. Tearful at times. Mood symptoms - reports decreased depression, anxiety, and irritability. Stating "I'm doing better". Work remains "stressful". Was taken out of work for a week with worsening mood symptoms. Feels like she will be able to return to work next Tuesday. Stable interest and motivation. Taking medications as prescribed. Plans to see therapist.   Energy levels improved. Active, does not have a regular exercise routine.  Enjoys some usual interests and activities. Single. Lives alone. Has 2 daughters. Talking with family and friends.  Appetite adequate. Weight stable.  Sleeps better some nights than others. Averages 6 to 8 hours. Focus and concentration improved. Completing tasks. Managing aspects of household. Works as a Animal nutritionist.  Denies SI or HI.  Denies AH or VH.  Review of Systems:  Review of Systems  Musculoskeletal: Negative for gait problem.  Neurological: Negative for tremors.  Psychiatric/Behavioral:       Please refer to HPI    Medications: I have reviewed the patient's current medications.  Current Outpatient Medications  Medication Sig Dispense Refill  . amphetamine-dextroamphetamine (ADDERALL) 30 MG tablet TAKE ONE (1) TABLET BY MOUTH EVERY DAY 30 tablet 0  . aspirin EC 81 MG tablet Take 81 mg by mouth daily.    . diazepam (VALIUM) 5 MG tablet Take one tablet daily. 30 tablet 2  . donepezil (ARICEPT) 10 MG tablet Take 1 tablet by mouth daily.    . Eszopiclone 3 MG TABS TAKE ONE TABLET (3MG TOTAL) BY MOUTH AT BEDTIME. TAKE IMMEDIATELY BEFORE BEDTIME. 30 tablet 2  . fexofenadine (ALLEGRA) 180 MG  tablet Take 180 mg by mouth as needed.    . fluticasone (FLONASE) 50 MCG/ACT nasal spray Place 1 spray into both nostrils in the morning.     . lamoTRIgine (LAMICTAL) 200 MG tablet Take 1 tablet (200 mg total) by mouth at bedtime. 90 tablet 3  . lamoTRIgine (LAMICTAL) 25 MG tablet Take two tablets every morning. 180 tablet 3  . PEG-KCl-NaCl-NaSulf-Na Asc-C (PLENVU) 140 g SOLR Take 1 kit by mouth as directed. Manufacturer's coupon Universal coupon code:BIN: P2366821; GROUP: [id number]; PCN: [id number]; ID: [id number]; PAY NO MORE $50; NO prior authorization 1 each 0  . QUEtiapine (SEROQUEL) 50 MG tablet Take one to three tablets at bedtime. 270 tablet 3  . Valbenazine Tosylate (INGREZZA) 80 MG CAPS Take 1 capsule by mouth at bedtime.     No current facility-administered medications for this visit.    Medication Side Effects: None  Allergies:  Allergies  Allergen Reactions  . Grass Extracts  [Gramineae Pollens] Cough, Hives, Itching, Shortness Of Breath and Swelling  . Shellfish Allergy Cough, Hives, Itching, Rash, Shortness Of Breath and Swelling  . Iodine Swelling  . Other Hives, Itching and Cough    Pine Nuts  . Penicillins     Unsure of reaction    Past Medical History:  Diagnosis Date  . ADHD   . Allergy   . Anemia   . Anxiety   . Bipolar disorder ([place])   . Depression   . Insomnia   . Memory impairment     Family History  Problem Relation Age of Onset  .  Breast cancer Mother   . Emphysema Father   . Heart attack Paternal Grandfather   . Colon cancer Paternal Uncle   . Esophageal cancer Neg Hx   . Stomach cancer Neg Hx     Social History   Socioeconomic History  . Marital status: Divorced    Spouse name: Not on file  . Number of children: 2  . Years of education: Not on file  . Highest education level: Not on file  Occupational History  . Occupation: Fish farm manager: [employer]  Tobacco Use  . Smoking status: Never Smoker  . Smokeless  tobacco: Never Used  Vaping Use  . Vaping Use: Never used  Substance and Sexual Activity  . Alcohol use: Yes    Alcohol/week: 2.0 standard drinks    Types: 2 Standard drinks or equivalent per week    Comment: occasional, 1-2 per week  . Drug use: No  . Sexual activity: Not on file  Other Topics Concern  . Not on file  Social History Narrative   Lives at home alone   Right-handed   Rare caffeine use   Social Determinants of Health   Financial Resource Strain: Not on file  Food Insecurity: Not on file  Transportation Needs: Not on file  Physical Activity: Not on file  Stress: Not on file  Social Connections: Not on file  Intimate Partner Violence: Not on file    Past Medical History, Surgical history, Social history, and Family history were reviewed and updated as appropriate.   Please see review of systems for further details on the patient's review from today.   Objective:   Physical Exam:  There were no vitals taken for this visit.  Physical Exam Constitutional:      General: She is not in acute distress. Musculoskeletal:        General: No deformity.  Neurological:     Mental Status: She is alert and oriented to person, place, and time.     Coordination: Coordination normal.  Psychiatric:        Attention and Perception: Attention and perception normal. She does not perceive auditory or visual hallucinations.        Mood and Affect: Mood normal. Mood is not anxious or depressed. Affect is not labile, blunt, angry or inappropriate.        Speech: Speech normal.        Behavior: Behavior normal.        Thought Content: Thought content normal. Thought content is not paranoid or delusional. Thought content does not include homicidal or suicidal ideation. Thought content does not include homicidal or suicidal plan.        Cognition and Memory: Cognition and memory normal.        Judgment: Judgment normal.     Comments: Insight intact     Lab Review:      Component Value Date/Time   NA 140 01/14/2020 1440   K 4.4 01/14/2020 1440   CL 104 01/14/2020 1440   CO2 30 01/14/2020 1440   GLUCOSE 89 01/14/2020 1440   BUN 16 01/14/2020 1440   CREATININE 0.89 01/14/2020 1440   CALCIUM 9.8 01/14/2020 1440   PROT 7.2 01/14/2020 1440   AST 14 01/14/2020 1440   ALT 19 01/14/2020 1440   BILITOT 0.6 01/14/2020 1440   GFRNONAA >60 09/29/2010 2228   GFRAA  09/29/2010 2228    >60        The eGFR  has been calculated using the MDRD equation. This calculation has not been validated in all clinical situations. eGFR's persistently <60 mL/min signify possible Chronic Kidney Disease.       Component Value Date/Time   WBC 7.2 08/09/2020 1501   RBC 4.09 08/09/2020 1501   HGB 12.2 08/09/2020 1501   HCT 37.0 08/09/2020 1501   PLT 341 08/09/2020 1501   MCV 90.5 08/09/2020 1501   MCV 92.9 01/26/2013 1254   MCH 29.8 08/09/2020 1501   MCHC 33.0 08/09/2020 1501   RDW 12.7 08/09/2020 1501   LYMPHSABS 2.6 09/29/2010 2228   MONOABS 0.7 09/29/2010 2228   EOSABS 0.3 09/29/2010 2228   BASOSABS 0.0 09/29/2010 2228    No results found for: POCLITH, LITHIUM   No results found for: PHENYTOIN, PHENOBARB, VALPROATE, CBMZ   .res Assessment: Plan:    Plan:  Lunesta 79m at hs   Lamictal 20104mat hs and 5046mvery morning. D/C - Seroquel 1m65m3 to 4 at bedtime  Adderall 30mg19mry morning Valium 5mg d11my prn anxiety  Recent BP check at PCP - WNL  RTC 4 weeks  Patient advised to contact office with any questions, adverse effects, or acute worsening in signs and symptoms.  Discussed potential benefits, risk, and side effects of benzodiazepines to include potential risk of tolerance and dependence, as well as possible drowsiness.  Advised patient not to drive if experiencing drowsiness and to take lowest possible effective dose to minimize risk of dependence and tolerance.  Counseled patient regarding potential benefits, risks, and side effects of  Lamictal to include potential risk of Stevens-Johnson syndrome. Advised patient to stop taking Lamictal and contact office immediately if rash develops and to seek urgent medical attention if rash is severe and/or spreading quickly.  Discussed potential metabolic side effects associated with atypical antipsychotics, as well as potential risk for movement side effects. Advised pt to contact office if movement side effects occur.    Diagnoses and all orders for this visit:  Major depressive disorder, recurrent episode, moderate (HCC)  Generalized anxiety disorder -     diazepam (VALIUM) 5 MG tablet; Take one tablet daily.  Insomnia, unspecified type -     Eszopiclone 3 MG TABS; TAKE ONE TABLET (3MG TOTAL) BY MOUTH AT BEDTIME. TAKE IMMEDIATELY BEFORE BEDTIME.  Attention deficit hyperactivity disorder (ADHD), combined type -     amphetamine-dextroamphetamine (ADDERALL) 30 MG tablet; TAKE ONE (1) TABLET BY MOUTH EVERY DAY     Please see After Visit Summary for patient specific instructions.  No future appointments.  No orders of the defined types were placed in this encounter.   -------------------------------

## 2020-10-16 ENCOUNTER — Other Ambulatory Visit: Payer: Self-pay

## 2020-10-16 ENCOUNTER — Telehealth: Payer: Self-pay | Admitting: Adult Health

## 2020-10-16 MED ORDER — TRAZODONE HCL 50 MG PO TABS: ORAL_TABLET | ORAL | 0 refills | Status: DC

## 2020-10-16 NOTE — Telephone Encounter (Signed)
Great thanks and I will let patient know.

## 2020-10-16 NOTE — Telephone Encounter (Signed)
Patient reports she did receive a letter from visit on Wednesday 02/16, to RTW on this coming Tuesday 02/22, but patient cried all day yesterday and just not feeling well today either. Patient asking for another letter for her to be off all next week. If she is feeling better by Tuesday she will return.  Patient also reports she's not sleeping, she is taking Lunesta but at last visit the Seroquel was discontinued due to side effects, reports she's waking up every hour. Anything else you recommend?

## 2020-10-16 NOTE — Telephone Encounter (Signed)
Letter typed and left at front desk for pickup.

## 2020-10-16 NOTE — Telephone Encounter (Signed)
Rtc to patient and let her know the letter was ready for pick up. I also discussed her sleep issues and if she would like to try something to help. She said she probably does need something, we discussed trazodone which she has taken before. It does make her groggry the next day but since she's not working she is okay with trying it. Informed her I would send it to her pharmacy to pick up. She will call back if having any issues.

## 2020-10-16 NOTE — Telephone Encounter (Signed)
Pt LM on VM stating she will not be returning to work next week. Will need a note stating this. Will pick up today. Advise when ready. Will call Pt to pick up.  [phone]

## 2020-10-21 ENCOUNTER — Telehealth: Payer: Self-pay | Admitting: Adult Health

## 2020-10-21 NOTE — Telephone Encounter (Signed)
Pt brought in FMLA paperwork to complete. Pt to pick up Friday 10/23/20 per Pt.  Paperwork was put on nurse desk

## 2020-10-23 DIAGNOSIS — Z0289 Encounter for other administrative examinations: Secondary | ICD-10-CM

## 2020-10-23 NOTE — Telephone Encounter (Signed)
Noted  

## 2020-10-23 NOTE — Telephone Encounter (Signed)
Rtc to patient to discuss her FMLA, she did bring a copy from previous year to have as well for reference. Patient has currently been out since 10/12/20 and will be returning on Monday 10/26/20. She reports feeling better when I spoke with her this morning.  Informed her I would complete paperwork now and have Gina review and sign. She was requesting to pick up before a funeral she is attending at 1 pm, I told her that is not a problem and to come by.

## 2020-10-26 ENCOUNTER — Encounter: Payer: Medicare PPO | Admitting: Gastroenterology

## 2020-10-28 ENCOUNTER — Telehealth: Payer: Self-pay | Admitting: Adult Health

## 2020-10-28 NOTE — Telephone Encounter (Signed)
I did not realize this needed to be faxed by 3 pm today. Will have Gina sign in the morning and then fax.

## 2020-10-28 NOTE — Telephone Encounter (Signed)
Noted  

## 2020-10-28 NOTE — Telephone Encounter (Signed)
Pt called and left a message asking if the FMLA was not faxed by 3 pm. She would like it to be faxed at 336 [phone]

## 2020-10-29 DIAGNOSIS — G2401 Drug induced subacute dyskinesia: Secondary | ICD-10-CM | POA: Diagnosis not present

## 2020-10-29 DIAGNOSIS — R259 Unspecified abnormal involuntary movements: Secondary | ICD-10-CM | POA: Diagnosis not present

## 2020-10-29 DIAGNOSIS — M542 Cervicalgia: Secondary | ICD-10-CM | POA: Diagnosis not present

## 2020-10-29 DIAGNOSIS — Z79899 Other long term (current) drug therapy: Secondary | ICD-10-CM | POA: Diagnosis not present

## 2020-10-29 DIAGNOSIS — R413 Other amnesia: Secondary | ICD-10-CM | POA: Diagnosis not present

## 2020-10-29 DIAGNOSIS — G309 Alzheimer's disease, unspecified: Secondary | ICD-10-CM | POA: Diagnosis not present

## 2020-10-29 DIAGNOSIS — F319 Bipolar disorder, unspecified: Secondary | ICD-10-CM | POA: Diagnosis not present

## 2020-10-29 NOTE — Telephone Encounter (Signed)
Noted  

## 2020-10-29 NOTE — Telephone Encounter (Signed)
Updated her paperwork. Patient wants to try to work 2 days, then off 1 day for a few weeks and see if that helps her stress and work load. She is not sure if school will agree with that accomodation but wants to at least see first.

## 2020-10-30 ENCOUNTER — Other Ambulatory Visit (HOSPITAL_COMMUNITY): Payer: Self-pay | Admitting: Neurology

## 2020-10-30 DIAGNOSIS — M542 Cervicalgia: Secondary | ICD-10-CM

## 2020-11-02 ENCOUNTER — Other Ambulatory Visit (HOSPITAL_COMMUNITY): Payer: Self-pay | Admitting: Neurology

## 2020-11-02 DIAGNOSIS — M25511 Pain in right shoulder: Secondary | ICD-10-CM

## 2020-11-04 ENCOUNTER — Telehealth: Payer: Self-pay | Admitting: Adult Health

## 2020-11-04 NOTE — Telephone Encounter (Signed)
Pt called and said that she would like you to send a script in of the generic of aricept. Her neurologist cancelled her script. If gina doesn't feel comfortable to send the script please give her a call back at [phone]

## 2020-11-04 NOTE — Telephone Encounter (Signed)
Will need to have details as to why it was cancelled.

## 2020-11-04 NOTE — Telephone Encounter (Signed)
Noted  

## 2020-11-04 NOTE — Telephone Encounter (Signed)
Left message to call back with reason Rx cancelled by Neurologist.

## 2020-11-05 ENCOUNTER — Other Ambulatory Visit: Payer: Self-pay

## 2020-11-05 ENCOUNTER — Ambulatory Visit (HOSPITAL_COMMUNITY)
Admission: RE | Admit: 2020-11-05 | Discharge: 2020-11-05 | Disposition: A | Discharge status: Home / Self Care | Payer: Medicare PPO | Source: Ambulatory Visit | Attending: Neurology | Admitting: Neurology

## 2020-11-05 DIAGNOSIS — M25511 Pain in right shoulder: Secondary | ICD-10-CM | POA: Insufficient documentation

## 2020-11-05 DIAGNOSIS — M47812 Spondylosis without myelopathy or radiculopathy, cervical region: Secondary | ICD-10-CM | POA: Diagnosis not present

## 2020-11-05 DIAGNOSIS — M542 Cervicalgia: Secondary | ICD-10-CM | POA: Diagnosis not present

## 2020-11-05 NOTE — Telephone Encounter (Signed)
She called and has everything cleared up with neurologist and doesn't need Rx from you

## 2020-11-05 NOTE — Telephone Encounter (Signed)
Noted  

## 2020-11-14 ENCOUNTER — Other Ambulatory Visit: Payer: Self-pay | Admitting: Adult Health

## 2020-11-24 ENCOUNTER — Other Ambulatory Visit: Payer: Self-pay

## 2020-11-24 ENCOUNTER — Ambulatory Visit (INDEPENDENT_AMBULATORY_CARE_PROVIDER_SITE_OTHER): Payer: Medicare PPO | Admitting: Psychiatry

## 2020-11-24 DIAGNOSIS — F331 Major depressive disorder, recurrent, moderate: Secondary | ICD-10-CM
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
## 2020-12-02 ENCOUNTER — Other Ambulatory Visit: Payer: Self-pay | Admitting: Adult Health

## 2020-12-02 DIAGNOSIS — F902 Attention-deficit hyperactivity disorder, combined type: Secondary | ICD-10-CM

## 2020-12-02 NOTE — Telephone Encounter (Signed)
Next apt 5/02

## 2020-12-03 DIAGNOSIS — Z79899 Other long term (current) drug therapy: Secondary | ICD-10-CM | POA: Diagnosis not present

## 2020-12-03 DIAGNOSIS — R259 Unspecified abnormal involuntary movements: Secondary | ICD-10-CM | POA: Diagnosis not present

## 2020-12-03 DIAGNOSIS — F319 Bipolar disorder, unspecified: Secondary | ICD-10-CM | POA: Diagnosis not present

## 2020-12-03 DIAGNOSIS — G2401 Drug induced subacute dyskinesia: Secondary | ICD-10-CM | POA: Diagnosis not present

## 2020-12-03 DIAGNOSIS — G309 Alzheimer's disease, unspecified: Secondary | ICD-10-CM | POA: Diagnosis not present

## 2020-12-03 DIAGNOSIS — M542 Cervicalgia: Secondary | ICD-10-CM | POA: Diagnosis not present

## 2020-12-03 DIAGNOSIS — R413 Other amnesia: Secondary | ICD-10-CM | POA: Diagnosis not present

## 2020-12-08 ENCOUNTER — Ambulatory Visit
Admission: EM | Admit: 2020-12-08 | Discharge: 2020-12-08 | Disposition: A | Discharge status: Home / Self Care | Payer: Medicare PPO | Attending: Emergency Medicine | Admitting: Emergency Medicine

## 2020-12-08 ENCOUNTER — Encounter: Payer: Self-pay | Admitting: Emergency Medicine

## 2020-12-08 ENCOUNTER — Other Ambulatory Visit: Payer: Self-pay

## 2020-12-08 ENCOUNTER — Ambulatory Visit (INDEPENDENT_AMBULATORY_CARE_PROVIDER_SITE_OTHER): Payer: Medicare PPO | Admitting: Psychiatry

## 2020-12-08 DIAGNOSIS — R059 Cough, unspecified: Secondary | ICD-10-CM

## 2020-12-08 DIAGNOSIS — J029 Acute pharyngitis, unspecified: Secondary | ICD-10-CM | POA: Diagnosis not present

## 2020-12-08 DIAGNOSIS — F331 Major depressive disorder, recurrent, moderate: Secondary | ICD-10-CM | POA: Diagnosis not present

## 2020-12-08 DIAGNOSIS — Z77098 Contact with and (suspected) exposure to other hazardous, chiefly nonmedicinal, chemicals: Secondary | ICD-10-CM

## 2020-12-08 MED ORDER — PREDNISONE 20 MG PO TABS: 20.0000 mg | ORAL_TABLET | Freq: Two times a day (BID) | ORAL | 0 refills | Status: AC

## 2020-12-08 MED ORDER — DEXAMETHASONE SODIUM PHOSPHATE 10 MG/ML IJ SOLN
10.0000 mg | Freq: Once | INTRAMUSCULAR | Status: AC
  Administered 2020-12-08: 10 mg via INTRAMUSCULAR

## 2020-12-08 NOTE — Discharge Instructions (Signed)
Get plenty of rest and push fluids Steroid shot given in office Prednisone prescribed.   Follow up with PCP for recheck and/or if symptoms persists Return or go to ER if you have any new or worsening symptoms such as fever, chills, fatigue, shortness of breath, wheezing, chest pain, nausea, changes in bowel or bladder habits, etc..Marland Kitchen
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
## 2020-12-25 ENCOUNTER — Ambulatory Visit: Payer: Medicare PPO | Admitting: Psychiatry

## 2020-12-28 ENCOUNTER — Ambulatory Visit (INDEPENDENT_AMBULATORY_CARE_PROVIDER_SITE_OTHER): Payer: Medicare PPO | Admitting: Adult Health

## 2020-12-28 ENCOUNTER — Other Ambulatory Visit: Payer: Self-pay

## 2020-12-28 ENCOUNTER — Encounter: Payer: Self-pay | Admitting: Adult Health

## 2020-12-28 DIAGNOSIS — F902 Attention-deficit hyperactivity disorder, combined type: Secondary | ICD-10-CM

## 2020-12-28 DIAGNOSIS — F411 Generalized anxiety disorder: Secondary | ICD-10-CM

## 2020-12-28 DIAGNOSIS — F331 Major depressive disorder, recurrent, moderate: Secondary | ICD-10-CM | POA: Diagnosis not present

## 2020-12-28 DIAGNOSIS — G4700 Insomnia, unspecified: Secondary | ICD-10-CM

## 2020-12-28 NOTE — Progress Notes (Signed)
Debra Buchanan [id number] 06/02/1956 65 y.o.  Subjective:   Patient ID:  Debra Buchanan is a 65 y.o. (DOB [birth year]) female.  Chief Complaint: No chief complaint on file.   HPI Debra Buchanan presents to the office today for follow-up of adjustment issues, BPD, ADHD, anxiety, depression, and insomnia.  Describes mood today as "ok". Pleasant. Tearful at times. Mood symptoms - reports decreased depression, anxiety, and irritability. Stating "I'm doing alright". Decreased stress in work setting. House being built - should be finished by June. Stable interest and motivation. Taking medications as prescribed. Seeing therapist Rinaldo Cloud.   Energy levels improved. Active, does not have a regular exercise routine.  Enjoys some usual interests and activities. Single. Lives alone. Has 2 daughters. Talking with family and friends.  Appetite adequate. Weight stable.  Sleeps better some nights than others. Averages 5 hours. Focus and concentration improved. Completing tasks. Managing aspects of household. Works as a Animal nutritionist.  Denies SI or HI.  Denies AH or VH.   F[facility] ED from 12/08/2020 in [facility] [place] No Risk       Review of Systems:  Review of Systems  Musculoskeletal: Negative for gait problem.  Neurological: Negative for tremors.  Psychiatric/Behavioral:       Please refer to HPI    Medications: I have reviewed the patient's current medications.  Current Outpatient Medications  Medication Sig Dispense Refill  . amphetamine-dextroamphetamine (ADDERALL) 30 MG tablet TAKE ONE (1) TABLET BY MOUTH EVERY DAY 30 tablet 0  . aspirin EC 81 MG tablet Take 81 mg by mouth daily.    . diazepam (VALIUM) 5 MG tablet Take one tablet daily. 30 tablet 2  . donepezil (ARICEPT) 10 MG tablet Take 1 tablet by mouth daily.    . Eszopiclone 3 MG TABS TAKE ONE TABLET (3MG TOTAL) BY MOUTH AT BEDTIME. TAKE IMMEDIATELY BEFORE BEDTIME. 30  tablet 2  . fexofenadine (ALLEGRA) 180 MG tablet Take 180 mg by mouth as needed.    . fluticasone (FLONASE) 50 MCG/ACT nasal spray Place 1 spray into both nostrils in the morning.     . lamoTRIgine (LAMICTAL) 200 MG tablet Take 1 tablet (200 mg total) by mouth at bedtime. 90 tablet 3  . lamoTRIgine (LAMICTAL) 25 MG tablet Take two tablets every morning. 180 tablet 3  . PEG-KCl-NaCl-NaSulf-Na Asc-C (PLENVU) 140 g SOLR Take 1 kit by mouth as directed. Manufacturer's coupon Universal coupon code:BIN: P2366821; GROUP: [id number]; PCN: [id number]; ID: [id number]; PAY NO MORE $50; NO prior authorization 1 each 0  . traZODone (DESYREL) 50 MG tablet TAKE ONE-HALF TABLET (25MG) TO ONE TABLET (50MG) BY MOUTH AT BEDTIME AS NEEDED FOR SLEEP 30 tablet 0  . Valbenazine Tosylate (INGREZZA) 80 MG CAPS Take 1 capsule by mouth at bedtime.     No current facility-administered medications for this visit.    Medication Side Effects: None  Allergies:  Allergies  Allergen Reactions  . Grass Extracts  [Gramineae Pollens] Cough, Hives, Itching, Shortness Of Breath and Swelling  . Shellfish Allergy Cough, Hives, Itching, Rash, Shortness Of Breath and Swelling  . Iodine Swelling  . Other Hives, Itching and Cough    Pine Nuts  . Penicillins     Unsure of reaction    Past Medical History:  Diagnosis Date  . ADHD   . Allergy   . Anemia   . Anxiety   . Bipolar disorder ([place])   . Depression   .  Insomnia   . Memory impairment     Past Medical History, Surgical history, Social history, and Family history were reviewed and updated as appropriate.   Please see review of systems for further details on the patient's review from today.   Objective:   Physical Exam:  There were no vitals taken for this visit.  Physical Exam Constitutional:      General: She is not in acute distress. Musculoskeletal:        General: No deformity.  Neurological:     Mental Status: She is alert and oriented to person, place, and  time.     Coordination: Coordination normal.  Psychiatric:        Attention and Perception: Attention and perception normal. She does not perceive auditory or visual hallucinations.        Mood and Affect: Mood normal. Mood is not anxious or depressed. Affect is not labile, blunt, angry or inappropriate.        Speech: Speech normal.        Behavior: Behavior normal.        Thought Content: Thought content normal. Thought content is not paranoid or delusional. Thought content does not include homicidal or suicidal ideation. Thought content does not include homicidal or suicidal plan.        Cognition and Memory: Cognition and memory normal.        Judgment: Judgment normal.     Comments: Insight intact     Lab Review:     Component Value Date/Time   NA 140 01/14/2020 1440   K 4.4 01/14/2020 1440   CL 104 01/14/2020 1440   CO2 30 01/14/2020 1440   GLUCOSE 89 01/14/2020 1440   BUN 16 01/14/2020 1440   CREATININE 0.89 01/14/2020 1440   CALCIUM 9.8 01/14/2020 1440   PROT 7.2 01/14/2020 1440   AST 14 01/14/2020 1440   ALT 19 01/14/2020 1440   BILITOT 0.6 01/14/2020 1440   GFRNONAA >60 09/29/2010 2228   GFRAA  09/29/2010 2228    >60        The eGFR has been calculated using the MDRD equation. This calculation has not been validated in all clinical situations. eGFR's persistently <60 mL/min signify possible Chronic Kidney Disease.       Component Value Date/Time   WBC 7.2 08/09/2020 1501   RBC 4.09 08/09/2020 1501   HGB 12.2 08/09/2020 1501   HCT 37.0 08/09/2020 1501   PLT 341 08/09/2020 1501   MCV 90.5 08/09/2020 1501   MCV 92.9 01/26/2013 1254   MCH 29.8 08/09/2020 1501   MCHC 33.0 08/09/2020 1501   RDW 12.7 08/09/2020 1501   LYMPHSABS 2.6 09/29/2010 2228   MONOABS 0.7 09/29/2010 2228   EOSABS 0.3 09/29/2010 2228   BASOSABS 0.0 09/29/2010 2228    No results found for: POCLITH, LITHIUM   No results found for: PHENYTOIN, PHENOBARB, VALPROATE, CBMZ    .res Assessment: Plan:    Plan:  Lunesta 48m at hs   Lamictal 2065mat hs and 5027mvery morning. Adderall 9m31mery morning   Recent BP check at PCP - WNL   4/12 - 132/82  RTC 4 weeks  Patient advised to contact office with any questions, adverse effects, or acute worsening in signs and symptoms.  Discussed potential benefits, risk, and side effects of benzodiazepines to include potential risk of tolerance and dependence, as well as possible drowsiness.  Advised patient not to drive if experiencing drowsiness and to take lowest possible effective  dose to minimize risk of dependence and tolerance.  Counseled patient regarding potential benefits, risks, and side effects of Lamictal to include potential risk of Stevens-Johnson syndrome. Advised patient to stop taking Lamictal and contact office immediately if rash develops and to seek urgent medical attention if rash is severe and/or spreading quickly.  Discussed potential metabolic side effects associated with atypical antipsychotics, as well as potential risk for movement side effects. Advised pt to contact office if movement side effects occur.     Diagnoses and all orders for this visit:  Major depressive disorder, recurrent episode, moderate (H[facility])  Attention deficit hyperactivity disorder (ADHD), combined type  Generalized anxiety disorder  Insomnia, unspecified type     Please see After Visit Summary for patient specific instructions.  Future Appointments  Date Time Provider [place]  12/31/2020  4:00 PM Shanon Ace, LCSW CP-CP None  01/05/2021  5:00 PM Shanon Ace, LCSW CP-CP None  01/19/2021  5:00 PM Shanon Ace, LCSW CP-CP None  02/02/2021  5:00 PM Shanon Ace, LCSW CP-CP None    No orders of the defined types were placed in this encounter.   -------------------------------

## 2020-12-29 MED ORDER — AMPHETAMINE-DEXTROAMPHETAMINE 30 MG PO TABS: 30.0000 mg | ORAL_TABLET | Freq: Every day | ORAL | 0 refills | Status: DC

## 2020-12-29 NOTE — Addendum Note (Signed)
Addended by: Aloha Gell on: 12/29/2020 07:55 AM   Modules accepted: Orders

## 2020-12-31 ENCOUNTER — Ambulatory Visit (INDEPENDENT_AMBULATORY_CARE_PROVIDER_SITE_OTHER): Payer: Medicare PPO | Admitting: Psychiatry

## 2020-12-31 ENCOUNTER — Other Ambulatory Visit: Payer: Self-pay

## 2020-12-31 DIAGNOSIS — F331 Major depressive disorder, recurrent, moderate: Secondary | ICD-10-CM | POA: Diagnosis not present

## 2020-12-31 NOTE — Progress Notes (Signed)
[facility] Counselor/Therapist Progress Note  Patient ID: Debra Buchanan, MRN: [id number],    Date: 12/31/2020   Time Spent: 60 minutes   4:00pm to 5:00pm  Treatment Type: Individual Therapy  Reported Symptoms: depression, anger, frustration, anxiety, overwhelmed  Mental Status Exam:  Appearance:   Casual     Behavior:  Appropriate, Sharing and Motivated  Motor:  Normal  Speech/Language:   Clear and Coherent  Affect:  anxious, depressed, frustrated  Mood:  anxious and depressed  Thought process:  goal directed  Thought content:    some obsessiveness  Sensory/Perceptual disturbances:    WNL  Orientation:  oriented to person, place, time/date, situation, day of week, month of year and year  Attention:  Good  Concentration:  Good and Fair  Memory:  WNL  Fund of knowledge:   Good  Insight:    Good and Fair  Judgment:   Good and Fair  Impulse Control:  Good and Fair   Risk Assessment: Danger to Self:  No Self-injurious Behavior: No Danger to Others: No Duty to Warn:no Physical Aggression / Violence:No  Access to Firearms a concern: No  Gang Involvement:No   Subjective: Patient today reports anxiety, depression, frustrated, anger, and feeling overwhelmed.    Interventions: Solution-Oriented/Positive Psychology and Ego-Supportive  Diagnosis:   ICD-10-CM   1. Major depressive disorder, recurrent episode, moderate ([place])  F33.1      Plan of Care: Patient not signing treatment plan on computer screen due to Covid.  Treatment goals: Treatment goals will remain on treatment plan as patient works to achieve her goals with strategies on goal plan. Progress will be assessed each visit and documented "Progress" section of Note.  Long term goal: Develop the ability to recognize, accept, and copewith feelings of depression. Alleviate depressed mood and function socially and emotionally at a higher level.Willdevelop more self-confidence to the point she is able  to trust people more and feel comfortable sharing her thoughts and feelings. Improve her self-confidence to whereshe can feel and say "I am enough".  Short term goal: Verbalizean understanding of the relationship between repressed anger and depressed mood.  Strategies: Identify cognitiveself talk that is engaged to support depression. Replace negative self-defeating self-talk with verbalization of realistic and positive cognitive messages. Resume exercising as that has helped her in the past with emotional distress.  Progress: Patient in today reporting depression, anxiety, anger, frustrated, and feeling overwhelmed. Symptoms heightened since her first appt due to personal and work related concerns. Stressed some about upcoming retirement in October 2022. Worked today on her feelings of "things having held me back, and feeling not good enough, not easy for her to share herself with others, doesn't believe compliments, and wanting to change these things. Processed her feelings on these things and able to see some inter-relatedness and also expressed more hopefulness and being able to discharge a lot of her feelings and emotions during session as we spoke.  Homework assignment given regarding her feelings of "not feeling good enough" and also "the things that have held me back".  We will pick up with patient again next session and continue our work.  In the meantime she is encouraged to follow through on her homework, to get outside daily and walk, to let go of being so self-critical and judgmental, to stay in the present and focus on what she can control, intentionally look for positives each day, practice more consistent positive self talk and self-care, stay in touch with others who  are supportive of her, and feel good about the strength she is showing and working on therapy goals especially as she confronts issues that have held her back in the past and as she tries to move forward at this point  in her life.  She is very motivated and works well in sessions.   Goal review and progress/challenges noted with patient.  Next appointment within 2 weeks.   Shanon Ace, LCSW

## 2021-01-05 ENCOUNTER — Ambulatory Visit (INDEPENDENT_AMBULATORY_CARE_PROVIDER_SITE_OTHER): Payer: Medicare PPO | Admitting: Psychiatry

## 2021-01-05 ENCOUNTER — Other Ambulatory Visit: Payer: Self-pay

## 2021-01-05 DIAGNOSIS — F331 Major depressive disorder, recurrent, moderate: Secondary | ICD-10-CM | POA: Diagnosis not present

## 2021-01-05 NOTE — Progress Notes (Signed)
[facility] Counselor/Therapist Progress Note  Patient ID: Debra Buchanan, MRN: [id number],    Date: 01/05/2021  Time Spent: 60 minutes    5:00pm to 6:00pm   Treatment Type: Individual Therapy  Reported Symptoms: stressed, anxious, depressed  Mental Status Exam:  Appearance:   Casual     Behavior:  Appropriate, Sharing and Motivated  Motor:  Normal  Speech/Language:   Clear and Coherent  Affect:  anxious, depressed  Mood:  anxious and depressed  Thought process:  goal directed  Thought content:    some obsessiveness  Sensory/Perceptual disturbances:    WNL  Orientation:  oriented to person, place, time/date, situation, day of week, month of year and year  Attention:  Good  Concentration:  Good  Memory:  WNL  Fund of knowledge:   Good  Insight:    Good  Judgment:   Good  Impulse Control:  Good   Risk Assessment: Danger to Self:  No Self-injurious Behavior: No Danger to Others: No Duty to Warn:no Physical Aggression / Violence:No  Access to Firearms a concern: No  Gang Involvement:No   Subjective: Patient today reports feeling stressed, depressed, and anxious. See Progress Note below.  Interventions: Solution-Oriented/Positive Psychology and Ego-Supportive  Diagnosis:   ICD-10-CM   1. Major depressive disorder, recurrent episode, moderate ([place])  F33.1      Plan of Care: Patient not signing treatment plan on computer screen due to Covid.  Treatment goals: Treatment goals will remain on treatment plan as patient works to achieve her goals with strategies on goal plan. Progress will be assessed each visit and documented "Progress" section of Note.  Long term goal: Develop the ability to recognize, accept, and copewith feelings of depression. Alleviate depressed mood and function socially and emotionally at a higher level.Willdevelop more self-confidence to the point she is able to trust people more and feel comfortable sharing her thoughts and  feelings. Improve her self-confidence to whereshe can feel and say "I am enough".  Short term goal: Verbalizean understanding of the relationship between repressed anger and depressed mood.  Strategies: Identify cognitiveself talk that is engaged to support depression. Replace negative self-defeating self-talk with verbalization of realistic and positive cognitive messages. Resume exercising as that has helped her in the past with emotional distress.  Progress: Patient in today reporting feeling stressed, anxious, and depressed. Not feeling as frustrated.  Still some feelings of overwhelmedness with work and making a decision on house. Worked more on her feelings of vulnerability and feelings she has about relationships. Difficulty with feeling close to others and some self-negating, and feeling "that I needed to have it all together." Married 23 yrs and divorced but still has contact with some of his family, but also lost a number of them as far as them not having contact with patient.  Followed up some on her homework assignment of "not feeling good enough" and processed some of her thoughts on this and how also relates to her feelings of aloneness at times.  Does seem to be doing more now to have increased contact with others or at least be available to do so.  Also looked at her communication patterns and how communication skills can affect her connection or lack of connection with others.  Worked with her strategy and treatment plan above and helping her identify negative, self-defeating self talk and replace it with more positive verbalization of realistic cognitive messages about herself, which seemed to be helpful to her.  Encouraged her to  continue working on this between sessions.  Also encouraged her to let go of being so self-critical and judgmental, to stay in touch with others who are supportive of her, to practice consistently positive self-care and self talk, to stay in the present  and focus on what she can control, to get outside daily and walk, intentionally look for positives daily, and recognize how even in the midst of challenging circumstances she is showing strength in working on her goal-directed behaviors and trying not to let certain things keep holding her back as she tries to move forward in a more positive direction.  Patient remains very motivated.   Goal review and progress/challenges noted with patient.  Next  appt within 2 weeks.   Shanon Ace, LCSW

## 2021-01-12 ENCOUNTER — Other Ambulatory Visit: Payer: Self-pay

## 2021-01-12 ENCOUNTER — Ambulatory Visit (INDEPENDENT_AMBULATORY_CARE_PROVIDER_SITE_OTHER): Payer: Medicare PPO | Admitting: Psychiatry

## 2021-01-12 DIAGNOSIS — F331 Major depressive disorder, recurrent, moderate: Secondary | ICD-10-CM | POA: Diagnosis not present

## 2021-01-12 NOTE — Progress Notes (Signed)
[facility] Counselor/Therapist Progress Note  Patient ID: Debra Buchanan, MRN: [id number],    Date: 01/12/2021  Time Spent: 81minutes    5:00pm to 6:00pm  Treatment Type: Individual Therapy  Reported Symptoms: anxiety, overwhelmed related to personal stressors and work stressors  Mental Status Exam:  Appearance:   Well Groomed     Behavior:  Appropriate, Sharing and Motivated  Motor:  Normal  Speech/Language:   Clear and Coherent  Affect:  anxiety, overwhelmed  Mood:  anxious  Thought process:  goal directed  Thought content:    some obsessiveness  Sensory/Perceptual disturbances:    WNL  Orientation:  oriented to person, place, time/date, situation, day of week, month of year and year  Attention:  Good  Concentration:  Good and Fair  Memory:  WNL  Fund of knowledge:   Good  Insight:    Good  Judgment:   Good  Impulse Control:  Good   Risk Assessment: Danger to Self:  No Self-injurious Behavior: No Danger to Others: No Duty to Warn:no Physical Aggression / Violence:No  Access to Firearms a concern: No  Gang Involvement:No   Subjective:  Patient in today reporting anxiety and feeling overwhelmed with personal and work stressors. Very concerned about "personal issues and vulnerability" but wants/needs more interpersonal closeness with other adult friends. Recalls some hurtful history in her family that relate strongly to her current discomfort with vulnerability.  Processes the history involved with her dysfunctional family, which was very difficult for patient but she also sees how it relates to difficulty being close to others over the years and currently.  Is wanting at this point to be able to be more open and vulnerable with others without being so anxious and fearful.  Discuss how she might "take some risks with current friends to be more vulnerable and open" and plans to take some steps towards this as she feels more comfortable in near future. Much more  grounded by end of session and seemed to feel a lot of relief after having shared more of her history that was very pertinent to her current circumstances.   Interventions: Solution-Oriented/Positive Psychology and Ego-Supportive  Diagnosis:   ICD-10-CM   1. Major depressive disorder, recurrent episode, moderate ([facility])  F33.1      Treatment Goal Plan: Patient not signing treatment plan on computer screen due to Covid. Treatment goals: Treatment goals will remain on treatment plan as patient works to achieve her goals with strategies on goal plan. Progress will be assessed each visit and documented "Progress" section of Note. Long term goal: Develop the ability to recognize, accept, and copewith feelings of depression. Alleviate depressed mood and function socially and emotionally at a higher level.Willdevelop more self-confidence to the point she is able to trust people more and feel comfortable sharing her thoughts and feelings. Improve her self-confidence to whereshe can feel and say "I am enough". Short term goal: Verbalizean understanding of the relationship between repressed anger and depressed mood. Strategies: Identify cognitiveself talk that is engaged to support depression. Replace negative self-defeating self-talk with verbalization of realistic and positive cognitive messages. Resume exercising as that has helped her in the past with emotional distress.    Progress / Plan: Patient in today reporting anxiety, stressed, overwhelmed due to personal and work stressors.  As noted above in "subjective" section of note, she worked hard today in dealing with some past issues that have a lot to do with her current circumstances and behaviors.  She  is showing progress along with even more motivation.  Not feeling as frustrated nor fearful.  Reports feeling heard and understood.  By end of session today she seemed to feel less overwhelmed.  Encouraged patient to feel good about  her work today here in session as it was not easy for her to share a fuller picture of her past, but very important in helping me understand its connection to the present.  (Not all details are included in this note due to patient privacy needs.)  Is working on better communication patterns with people in order to be better understood and have stronger connections.  Is also working to decrease her negative, self-defeating self talk and replace it with more positive and realistic cognitive messages about herself and her future.  She is to continue working on this between sessions along with letting go of self-critical and judgmental thoughts, to stay in touch with people that are supportive, to get outside daily and walk, intentionally look for positives daily, to stay in the present focusing on what she can control, to practice consistently positive self-care and self talk, and to feel good about the strength she demonstrates in the midst of challenging circumstances and uncertainties and working on her goal-directed behaviors trying to move forward in a more positive direction and not letting anything hold her back, as it has in the past.  Goal review and progress/challenges noted with patient.  Next appointment within 2 weeks.     Shanon Ace, LCSW

## 2021-01-19 ENCOUNTER — Other Ambulatory Visit: Payer: Self-pay

## 2021-01-19 ENCOUNTER — Ambulatory Visit (INDEPENDENT_AMBULATORY_CARE_PROVIDER_SITE_OTHER): Payer: Medicare PPO | Admitting: Psychiatry

## 2021-01-19 DIAGNOSIS — F331 Major depressive disorder, recurrent, moderate: Secondary | ICD-10-CM | POA: Diagnosis not present

## 2021-01-19 NOTE — Progress Notes (Signed)
[facility] Counselor/Therapist Progress Note  Patient ID: Debra Buchanan, MRN: [id number],    Date: 01/19/2021  Time Spent: 60 minutes  Treatment Type: Individual Therapy  Reported Symptoms: anxiety, depression, some tearfulness while feeling overwhelmed and frustrated between work and having to move  Mental Status Exam:  Appearance:   Casual and Neat     Behavior:  Appropriate, Sharing and Motivated  Motor:  Normal  Speech/Language:   Clear and Coherent  Affect:  anxious, depression  Mood:  anxious  Thought process:  goal directed  Thought content:    some obsessiveness  Sensory/Perceptual disturbances:    WNL  Orientation:  oriented to person, place, time/date, situation, day of week, month of year and year  Attention:  Good  Concentration:  Good  Memory:  WNL  Fund of knowledge:   Good  Insight:    Good and Fair  Judgment:   Good  Impulse Control:  Good   Risk Assessment: Danger to Self:  No Self-injurious Behavior: No Danger to Others: No Duty to Warn:no Physical Aggression / Violence:No  Access to Firearms a concern: No  Gang Involvement:No   Subjective:  Patient in today reporting anxiety, depression, feeling more stress with work and getting ready to move. Worked today, using some CBT and Insight-Oriented therapy to interrupt some of her anxious/depressive thoughts and replace them with more positive, reality-based thoughts that do not lead to increased anxiety.  Still feeling overwhelmed and anxious with work, and some personal stressors. Discussed also some of her personal issues, some related to her past and is more escalated presently and desiring more interpersonal closeness with other adults who would be healthy for her. Hard to be vulnerable and her family history contributes heavily to this, which we process more today. Is taking some risks/steps to try and be closer in relationship and be able to trust more, which can be stressful for patient but  also feels it's important steps for her to take especially at this point in her life.     Interventions: Cognitive Behavioral Therapy, Solution-Oriented/Positive Psychology and Ego-Supportive  Diagnosis:   ICD-10-CM   1. Major depressive disorder, recurrent episode, moderate ([facility])  F33.1      Treatment Goal Plan: Patient not signing treatment plan on computer screen due to Covid. Treatment goals: Treatment goals will remain on treatment plan as patient works to achieve her goals with strategies on goal plan. Progress will be assessed each visit and documented "Progress" section of Note. Long term goal: Develop the ability to recognize, accept, and copewith feelings of depression. Alleviate depressed mood and function socially and emotionally at a higher level.Willdevelop more self-confidence to the point she is able to trust people more and feel comfortable sharing her thoughts and feelings. Improve her self-confidence to whereshe can feel and say "I am enough". Short term goal: Verbalizean understanding of the relationship between repressed anger and depressed mood. Strategies: Identify cognitiveself talk that is engaged to support depression. Replace negative self-defeating self-talk with verbalization of realistic and positive cognitive messages. Resume exercising as that has helped her in the past with emotional distress.    Progress / Plan:  Patient today more anxious, depressed,and overwhelmed/stressed. Denies any SI.  Did a good job today in dealing more with her stress and frustrations leading to more depressed and anxious thoughts regarding personal and work situations.  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  She did seem better towards the end of the session as far as being more grounded and thinking more calmly and looking at what she can do and what she cannot do as  far as time and everything that is on her list to get done before the end of school. Is showing more progress with her motivation, as well as working on being more self confident and trusting of other people which is a big issue for patient.  Her frustration and fearful thoughts are not as strong.  Seemed much less overwhelmed after talking a lot in session today and being able to vent as well as received some suggestions on better management of some of her anxieties regarding others in healthy ways.  Encouraged patient to continue working on improved communication patterns with people hoping to have stronger connections with others, to decrease her negative and self-defeating self talk and replace it with more positive and realistic cognitive messages about herself and her future.  Is to work between sessions on this as well as trying to decrease her judgmental and self critical thoughts, to get outside daily and walk, to stay in the present focused on what she can control, to maintain contact with people that are supportive of her, intentionally look for more positives and negatives, take breaks as she can during the day, and to recognize the strength she is showing as she works with her goals to become more emotionally healthy and move forward in a positive direction.   Goal review and progress/challenges noted with patient.  Next appointment within 2 weeks.   Shanon Ace, LCSW

## 2021-01-26 ENCOUNTER — Ambulatory Visit: Payer: Medicare PPO | Admitting: Psychiatry

## 2021-02-02 ENCOUNTER — Ambulatory Visit (INDEPENDENT_AMBULATORY_CARE_PROVIDER_SITE_OTHER): Payer: Medicare PPO | Admitting: Psychiatry

## 2021-02-02 ENCOUNTER — Other Ambulatory Visit: Payer: Self-pay | Admitting: Adult Health

## 2021-02-02 ENCOUNTER — Other Ambulatory Visit: Payer: Self-pay

## 2021-02-02 DIAGNOSIS — F902 Attention-deficit hyperactivity disorder, combined type: Secondary | ICD-10-CM

## 2021-02-02 DIAGNOSIS — F411 Generalized anxiety disorder: Secondary | ICD-10-CM

## 2021-02-02 NOTE — Progress Notes (Signed)
[facility] Counselor/Therapist Progress Note  Patient ID: Debra Buchanan, MRN: [id number],    Date: 02/02/2021  Time Spent: 60 minutes  Treatment Type: Individual Therapy  Reported Symptoms: anxiety, depression  Mental Status Exam:  Appearance:   Well Groomed     Behavior:  Appropriate, Sharing and Motivated  Motor:  Normal  Speech/Language:   Clear and Coherent  Affect:  anxiety  Mood:  anxious  Thought process:  goal directed  Thought content:    WNL  Sensory/Perceptual disturbances:    WNL  Orientation:  oriented to person, place, time/date, situation, day of week, month of year and year  Attention:  Good  Concentration:  Good  Memory:  WNL  Fund of knowledge:   Good  Insight:    Good  Judgment:   Good  Impulse Control:  Good   Risk Assessment: Danger to Self:  No Self-injurious Behavior: No Danger to Others: No Duty to Warn:no Physical Aggression / Violence:No  Access to Firearms a concern: No  Gang Involvement:No   Subjective:  Patient in today reporting some increased anxiety mostly personal and work related. Also in midst of soon moving to different house, and feels lots of Lots of work stress that has been very difficult. Also some self-esteem and vulnerability issues especially in comparing ex-husband to people she meets now which never goes well. Wants closeness but then runs from it when opportunities arise for meeting new people. Minimizes self and frequently runs from being close to others. Discussed ways of not being as fearful of relationships and not second-guessing/discounting herself so much, and being able to tolerate discomfort in being vulnerable with others. Discussed specific examples today in session and used some CBT to work further on this and interrupt some of her self-negating, self-limiting behavior which as she reports contradicts what she would really like in her life including healthier relationships.  Some decrease in overwhelmedness  at work.  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  Is hoping to work further on her goals this summer and make more progress on those goals and in feeling better about herself individually and in relationship with others.  Knows that it will mean working through and letting go of some things from her past that have held her back including family history that has contributed heavily to some of her "dysfunctional feelings" and in her difficulty in taking risks to be closer to people and trust more.  Patient showing increased motivation and worked well in session today.  Interventions: Ego-Supportive and Insight-Oriented  Diagnosis:   ICD-10-CM   1. Generalized anxiety disorder  F41.1     Treatment Goal Plan:  Patient not signing treatment plan on computer screen due to Covid. Treatment goals: Treatment goals will remain on treatment plan as patient works to achieve her goals with strategies on goal plan. Progress will be assessed each visit and documented "Progress" section of Note. Long term goal: Develop the ability to recognize, accept, and copewith feelings of depression. Alleviate depressed mood and function socially and emotionally at a higher level.Willdevelop more self-confidence to the point she is able to trust people more and feel comfortable sharing her thoughts and feelings. Improve her self-confidence to whereshe can feel and say "I am enough". Short term goal: Verbalizean understanding of the relationship between repressed anger and depressed mood. Strategies: Identify cognitiveself talk that is engaged to support depression.  Replace negative self-defeating self-talk with verbalization of realistic and positive cognitive messages. Resume exercising as that has helped her in the past with emotional distress.    Progress / Plan: Patient today showed good motivation and follow-through today in session and  seems to be getting gradually more comfortable talking about sensitive issues, some of which there is a long history to.  Despite her anxiety and depression, she works hard in sessions and I think that is helping to reduce her anxiety and depression as well.  Starting to show some improved feelings about herself and also seems to be valuing herself more which is encouraging.  Between sessions, patient is to continue working on improved communication patterns with people hoping to have stronger connections with others, to work on decreasing her self negating and self defeating self talk and replace it with more positive and accepting self talk, allow for more positive and realistic messages about herself and her future, decrease her judgmental/self-critical thoughts, intentionally look for more positives than negatives daily, take breaks as she can throughout the day or evening, to get outside daily and walk, stay in contact with people that are supportive of her, remain in the present focused on what she can control, and to feel good about the strength she shows as she works with goal-directed behaviors in the midst of challenging and often changing circumstances as she tries to move forward in a positive direction for better overall emotional health.  Goal review and progress/challenges noted with patient.  Next appointment within 2 weeks.   Shanon Ace, LCSW

## 2021-02-03 ENCOUNTER — Other Ambulatory Visit: Payer: Self-pay

## 2021-02-03 NOTE — Telephone Encounter (Signed)
Last filled 01/01/21

## 2021-02-03 NOTE — Telephone Encounter (Signed)
Debra Buchanan needs a refill on her Adderall. The pharmacy faxed a script yesterday. She is going out of town tomorrow. Can this be called in to:  [place], [place] Phone:  [phone]  Fax:  [phone]

## 2021-02-04 ENCOUNTER — Telehealth: Payer: Self-pay | Admitting: Adult Health

## 2021-02-04 NOTE — Telephone Encounter (Signed)
I have been trying to reach her all day,LVM to return call

## 2021-02-04 NOTE — Telephone Encounter (Signed)
Collie Siad as follow up of a note she said she left at the office about a month ago.  She reports that she is not sleeping but about 3 hours a night.  Her present sleep medication is just not helping.  She saw a medication advertised on TV and was wanting to know what you thought about trying that medication or something else.  Please call to discuss.  Doesn't have appt. Until 03/30/21

## 2021-02-08 NOTE — Telephone Encounter (Signed)
Left another VM to return call

## 2021-02-10 ENCOUNTER — Ambulatory Visit: Payer: Medicare PPO | Admitting: Psychiatry

## 2021-02-16 ENCOUNTER — Other Ambulatory Visit: Payer: Self-pay

## 2021-02-16 ENCOUNTER — Ambulatory Visit: Payer: Medicare PPO | Admitting: Psychiatry

## 2021-02-16 DIAGNOSIS — F411 Generalized anxiety disorder: Secondary | ICD-10-CM | POA: Diagnosis not present

## 2021-02-16 NOTE — Progress Notes (Signed)
[facility] Counselor/Therapist Progress Note  Patient ID: Debra Buchanan, MRN: [id number],    Date: 02/16/2021  Time Spent: 60 minutes   Treatment Type: Individual Therapy  Reported Symptoms:   anxiety, "social anxiety"  Mental Status Exam:  Appearance:   Casual and Neat     Behavior:  Appropriate, Sharing, and Motivated  Motor:  Normal  Speech/Language:   Clear and Coherent  Affect:  anxious  Mood:  anxious  Thought process:  goal directed  Thought content:    Some obsessiveness  Sensory/Perceptual disturbances:    WNL  Orientation:  oriented to person, place, time/date, situation, day of week, month of year, and year  Attention:  Good  Concentration:  Fair  Memory:  WNL  Fund of knowledge:   Good  Insight:    Good  Judgment:   Good  Impulse Control:  Good   Risk Assessment: Danger to Self:  No Self-injurious Behavior: No Danger to Others: No Duty to Warn:no Physical Aggression / Violence:No  Access to Firearms a concern: No  Gang Involvement:No   Subjective:  Patient in today reporting anxiety re: recently moving and meeting new friends. Also having social anxiety. Very stressed after moving and is in midst of selling another home which has been very stressful due to unexpected issues that arose "but is in midst of working things out."  Anxious re: moving situation, personal situations, and work. Needed session today to vent and process anxiety that relates to her personal issues and moving. Also needing to set healthier boundaries with a person she has met and discussed ways of her following through with more appropriate limits in newer relationships. A little progress in self-esteem and is still working on that.  Shared her fears of new relationships and also some of her progress in meeting new people recently close to her new home. Has tended to minimize herself often and has been very uncomfortable in meeting others but is doing a good job in confronting these  feelings and being able to talk about them more openly as well as willing to take the risk to make some changes in order to improve her comfort level and meeting new people.  Again worked with some CBT to help reduce her self negating and self limiting behaviors and be able to imagine herself behaving differently, and what might help her to follow through on this.  Continues to work on letting go more of some things from her past and ways of negatively viewing herself, as those things have tended to support patient's feelings of being "dysfunctional" and in her difficulty taking risks to be closer to people and trust more.  Seems to feel good about her work in session today and is motivated to practice some new behaviors between sessions.    Interventions: Cognitive Behavioral Therapy, Ego-Supportive, and Insight-Oriented  Diagnosis:   ICD-10-CM   1. Generalized anxiety disorder  F41.1       Plan:  Patient today showing good motivation and more comfort in talking about very sensitive/personal issues.  Some improvement in her thoughts about herself and learning to value herself more.  Patient encouraged to follow through on some behaviors that have tended to be helpful to her in the past including decrease her self negating and self defeating self talk and replace it with more positive and accepting self talk, improve her communication patterns with people hoping to have stronger connections with others, intentionally look for more positives daily, allow herself  to have more positive and realistic messages about herself and her future, decrease her judgmental/self-critical thoughts, take breaks as she can throughout the day, get outside daily and walk, remain in the present focused on what she can control, staying in contact with people that are supportive of her, and to feel good about the courage and strength that she shows in working with goal directed behaviors while in the midst of challenging and  often changing circumstances as she tries to move forward towards better emotional health.  Review and progress/challenges noted with patient.  Next appointment within 2 to 3 weeks.   , LCSW                   

## 2021-02-17 ENCOUNTER — Other Ambulatory Visit: Payer: Self-pay | Admitting: Adult Health

## 2021-02-17 DIAGNOSIS — G4700 Insomnia, unspecified: Secondary | ICD-10-CM

## 2021-02-17 DIAGNOSIS — L821 Other seborrheic keratosis: Secondary | ICD-10-CM | POA: Diagnosis not present

## 2021-02-17 DIAGNOSIS — C44612 Basal cell carcinoma of skin of right upper limb, including shoulder: Secondary | ICD-10-CM | POA: Diagnosis not present

## 2021-02-17 DIAGNOSIS — C44329 Squamous cell carcinoma of skin of other parts of face: Secondary | ICD-10-CM | POA: Diagnosis not present

## 2021-02-19 NOTE — Telephone Encounter (Signed)
Please send if appropriate. Next apt is 03/2021

## 2021-03-04 ENCOUNTER — Ambulatory Visit (INDEPENDENT_AMBULATORY_CARE_PROVIDER_SITE_OTHER): Payer: Medicare PPO | Admitting: Psychiatry

## 2021-03-04 ENCOUNTER — Other Ambulatory Visit: Payer: Self-pay

## 2021-03-04 ENCOUNTER — Other Ambulatory Visit: Payer: Self-pay | Admitting: Adult Health

## 2021-03-04 DIAGNOSIS — F902 Attention-deficit hyperactivity disorder, combined type: Secondary | ICD-10-CM

## 2021-03-04 DIAGNOSIS — F411 Generalized anxiety disorder: Secondary | ICD-10-CM

## 2021-03-04 NOTE — Telephone Encounter (Signed)
Last filled 02/03/21

## 2021-03-04 NOTE — Progress Notes (Signed)
[facility] Counselor/Therapist Progress Note  Patient ID: Debra Buchanan, MRN: [id number],    Date: 03/04/2021  Time Spent:  58 minutes  Treatment Type: Individual Therapy  Reported Symptoms: anxiety  Mental Status Exam:  Appearance:   Casual and Neat     Behavior:  Appropriate, Sharing, and Motivated  Motor:  Normal  Speech/Language:   Clear and Coherent  Affect:  anxiety  Mood:  anxious  Thought process:  normal/goal oriented  Thought content:    Obsessions  Sensory/Perceptual disturbances:    WNL  Orientation:  oriented to person, place, time/date, situation, day of week, month of year, year, and stated date of March 04, 2021  Attention:  Good  Concentration:  Good  Memory:  WNL  Fund of knowledge:   Good  Insight:    Good  Judgment:   Good  Impulse Control:  Good   Risk Assessment: Danger to Self:  No Self-injurious Behavior: No Danger to Others: No Duty to Warn:no Physical Aggression / Violence:No  Access to Firearms a concern: No  Gang Involvement:No   Subjective:    In today reporting anxiety as her main concern and is mostly related to her recent move to a new home, lots of adjustment issues, personal issues, and pending work situations. Has met a few people in her new neighborhood. Some of her social anxiety has been an issue in her meeting new people but patient states she hasn't backed off from people except on one occasion where there were several people gathered for a July 4th event. Not as anxious about her move as it has gone relatively well she reports. Needed session to discuss more of her anxiety issues that relate to her family and work environment.  Focused more on the family relationships and her 2 adult children who live in [place], and who maintain a relationship with patient and with their father (patient's ex-husband). (Not all details included in note due to patient privacy needs). Admits that she ruminates often about prior marriage  and how things that have happened, ended up affecting all the family. Easy to fall into self-negating and self-limiting behaviors which she feels dates back to her "growing up years for survival". Fears new relationships yet wants to be able to have them. Minimizes self, people pleasing.   Used more CBT aimed at helping reduce her self-negating and knowing that she does "deserve" to feel better about herself , let go of things from the past, and being able move forward in more positive direction. Homework assignment given re: unresolved issues from past as these things tend to lead patient to feel negatively about herself and support her feelings that she is dysfunctional and not able to have healthy relationships and feel trust with others.   Interventions: Cognitive Behavioral Therapy and Insight-Oriented  Diagnosis:   ICD-10-CM   1. Generalized anxiety disorder  F41.1        Treatment goal plan:  Patient not signing treatment plan on computer screen due to Covid. Treatment goals: Treatment goals will remain on treatment plan as patient works to achieve her goals with strategies on goal plan.  Progress will be assessed each visit and documented "Progress" section of Note. Long term goal: Develop the ability to recognize, accept, and cope with feelings of depression. Alleviate depressed mood and function socially and emotionally at a higher level.  Will develop more self-confidence to the point she is able to trust people more and feel comfortable  sharing her thoughts and feelings.  Improve her self-confidence to where she can feel and say "I am enough". Short term goal: Verbalize an understanding of the relationship between repressed anger and depressed mood. Strategies: Identify cognitive self talk that is engaged to support depression. Replace negative self-defeating self-talk with verbalization of realistic and positive cognitive messages. Resume exercising as that has helped her in the  past with emotional distress    Plan:  Patient today showing good motivation and increasing openness in talking about very sensitive and personal issues.  A lot of past negativity has stuck with her over the years.  Does continue to seem motivated however doubts herself often.  Patient encouraged to follow through with some behaviors that have tended to be useful to her in the past between appointments including: Improving communication patterns with people that she is hoping to have stronger connections with, intentionally look for more positives daily, decrease her self negating and self defeating self talk and replace it with more positive and accepting self talk, look for positives within herself, allow herself to have more positive and realistic messages about herself and her, decrease her judgmental/self-critical thoughts, take breaks as she can throughout the day, remain in the present focused on what she can control, stay in contact with people that are supportive of her, get outside daily and walk, and to feel positive and hopefully energized by the courage and strength that she shows in working with goal-directed behaviors while in the midst of challenging circumstances in trying to move forward in a more positive direction.  Goal review and progress/challenges noted with patient.  Next appointment within 2 to 3 weeks.   Shanon Ace, LCSW

## 2021-03-05 ENCOUNTER — Ambulatory Visit: Payer: Medicare PPO | Admitting: Psychiatry

## 2021-03-05 DIAGNOSIS — F909 Attention-deficit hyperactivity disorder, unspecified type: Secondary | ICD-10-CM | POA: Diagnosis not present

## 2021-03-05 DIAGNOSIS — Z880 Allergy status to penicillin: Secondary | ICD-10-CM | POA: Diagnosis not present

## 2021-03-05 DIAGNOSIS — R259 Unspecified abnormal involuntary movements: Secondary | ICD-10-CM | POA: Diagnosis not present

## 2021-03-05 DIAGNOSIS — F958 Other tic disorders: Secondary | ICD-10-CM | POA: Diagnosis not present

## 2021-03-15 ENCOUNTER — Ambulatory Visit: Payer: Medicare PPO | Admitting: Psychiatry

## 2021-03-15 ENCOUNTER — Other Ambulatory Visit: Payer: Self-pay

## 2021-03-15 DIAGNOSIS — F411 Generalized anxiety disorder: Secondary | ICD-10-CM | POA: Diagnosis not present

## 2021-03-15 NOTE — Progress Notes (Signed)
[facility] Counselor/Therapist Progress Note  Patient ID: Debra Buchanan, MRN: [id number],    Date: 03/15/2021  Time Spent: 58 minutes   Treatment Type: Individual Therapy  Reported Symptoms: anxiety, "some down feelings about self, but not depression"  Mental Status Exam:  Appearance:   Casual     Behavior:  Appropriate, Sharing, and Motivated  Motor:  Normal  Speech/Language:   Clear and Coherent and Normal Rate  Affect:  anxious  Mood:  anxious  Thought process:  goal directed  Thought content:    Some obsessiveness  Sensory/Perceptual disturbances:    WNL  Orientation:  oriented to person, place, time/date, situation, day of week, month of year, year, and stated date of March 15, 2021  Attention:  Good  Concentration:  Good  Memory:  WNL  Fund of knowledge:   Good  Insight:    Good and Fair  Judgment:   Good  Impulse Control:  Good   Risk Assessment: Danger to Self:  No Self-injurious Behavior: No Danger to Others: No Duty to Warn:no Physical Aggression / Violence:No  Access to Firearms a concern: No  Gang Involvement:No   Subjective:  Patient in today reporting anxiety, and mild depression. Brought in homework assignment focusing regrets and events from her past that hold her back, feelings of making bad decisions, and not doing the best by her kids. Actually shared some of this with her kids who have responded in a supportive way "but there's still lots of things they don't know. Processed some of her "mistakes/concerns from the past and how they impacted her and her children. She did a list of her regrets as part of her homework which helped guide her talking in session and she used that to process a lot of difficult feelings including pain, ruminating, sadness, hurt, mistrust, and wondering how she could ever be in relationship again.  Still fears relationships although wants to have them, and realizing some of her hesitancy based on past experience.  Still  does some people pleasing and tends to minimize herself.  We again used more CBT to help in reducing her own self negating and realizing that is not a healthy pattern for her and starting to consider that.  Ran out of session today and will have to pick up next session and she is to continue some journaling between sessions.   Interventions: Cognitive Behavioral Therapy and Insight-Oriented  Diagnosis:   ICD-10-CM   1. Generalized anxiety disorder  F41.1       Treatment goal plan:  Patient not signing treatment plan on computer screen due to Covid. Treatment goals: Treatment goals will remain on treatment plan as patient works to achieve her goals with strategies on goal plan.  Progress will be assessed each visit and documented "Progress" section of Note. Long term goal: Develop the ability to recognize, accept, and cope with feelings of depression. Alleviate depressed mood and function socially and emotionally at a higher level.  Will develop more self-confidence to the point she is able to trust people more and feel comfortable sharing her thoughts and feelings.  Improve her self-confidence to where she can feel and say "I am enough". Short term goal: Verbalize an understanding of the relationship between repressed anger and depressed mood. Strategies: Identify cognitive self talk that is engaged to support depression. Replace negative self-defeating self-talk with verbalization of realistic and positive cognitive messages. Resume exercising as that has helped her in the past with emotional distress  Plan:  Patient today showing good motivation in session.  Processed some of her prior journaling and was able to be more open than she has in the past with her thoughts and feelings especially about things in the past that have held her back.  Obviously, a lot of past negativity has stuck with her over the years and some of it is hard for her to understand and be able to let go of.   Patient encouraged to use some behaviors that can help her between sessions including decreasing her self negating and self defeating self talk, improving communication patterns with people that she is hoping to have stronger connections with, look for more positives within herself, intentionally looking for more positives than negatives daily, allow herself to have more positive and realistic messages about herself past and present, decrease her judgmental/self-critical thoughts, take breaks as she can throughout the day, stay in contact with people that are supportive, remain in the present focused on what she can control, get outside daily and walk, and feel good about the strength and courage and she shows in working with goal-directed behaviors while in the midst of challenging circumstances as she tries to move forward in a more positive direction of improved emotional health.  Goal review and progress/challenges noted with patient.  Next appointment within 2 weeks.   Shanon Ace, LCSW

## 2021-03-17 ENCOUNTER — Ambulatory Visit (INDEPENDENT_AMBULATORY_CARE_PROVIDER_SITE_OTHER): Payer: Medicare PPO | Admitting: Psychiatry

## 2021-03-17 ENCOUNTER — Other Ambulatory Visit: Payer: Self-pay

## 2021-03-17 DIAGNOSIS — F411 Generalized anxiety disorder: Secondary | ICD-10-CM

## 2021-03-17 NOTE — Progress Notes (Signed)
[facility] Counselor/Therapist Progress Note  Patient ID: Debra Buchanan, MRN: [id number],    Date: 03/17/2021  Time Spent: 58 minutes   Treatment Type: Individual Therapy  Reported Symptoms: anxiety,depression, overwhelmed   Mental Status Exam:  Appearance:   Casual and Neat     Behavior:  Appropriate, Sharing, and Motivated  Motor:  Normal  Speech/Language:   Clear and Coherent and Normal Rate  Affect:  Anxious, some depression  Mood:  anxious and depressed  Thought process:  goal directed  Thought content:    Some obsessiveness  Sensory/Perceptual disturbances:    WNL  Orientation:  oriented to person, place, time/date, situation, day of week, month of year, year, and stated date of March 17, 2021  Attention:  Fair  Concentration:  Fair  Memory:  Lane of knowledge:   Good  Insight:    Good and Fair  Judgment:   Good  Impulse Control:  Good   Risk Assessment: Danger to Self:  No Self-injurious Behavior: No Danger to Others: No Duty to Warn:no Physical Aggression / Violence:No  Access to Firearms a concern: No  Gang Involvement:No   Subjective:  Patient in today reporting anxiety, depression, overwhelmed with feelings and "don't understand what it is but I get so worked up and I get busy doing things and can work non-stop".  Denies SI.  "Some similar symptoms from my past when I was diagnosed bipolar". Today describes the feeling of her "mind being all over the place, going and going non-stop, and my thoughts just being all over the place, not relating openly with others, and hiding my true self and feeling." In processing today she feels her life changed when at age 65 I had an abortion, kept it hidden as much as possible. "Lied to grandparents to get the money for abortion." Just felt really bad about myself for getting pregnant and making decision to have abortion.  Felt responsible how family would feel.  Started college and moved out the same weekend. Did  return eventually to school, but hard to focus and feeling bad about herself and "cannot handle relationships very well".  Worked with some CBT strategies as she described her anxiety and fears and negative feelings towards herself.  Tearfully describes some past mistakes and decisions she made that she feels badly about. Discussed "working more intentionally on her self-negating, nothing is ever enough for me, tend to miss stressed and feel hurt by others, always feeling down on myself, not very self forgiving".  Challenged her to work with some of the forgiveness strategies we discussed today as well as working to interrupt the self negating.  Was calmer and more grounded by end of session.   Interventions: Cognitive Behavioral Therapy and Solution-Oriented/Positive Psychology  Diagnosis:   ICD-10-CM   1. Generalized anxiety disorder  F41.1       Treatment goal plan:  Patient not signing treatment plan on computer screen due to Covid. Treatment goals: Treatment goals will remain on treatment plan as patient works to achieve her goals with strategies on goal plan.  Progress will be assessed each visit and documented "Progress" section of Note. Long term goal: Develop the ability to recognize, accept, and cope with feelings of depression. Alleviate depressed mood and function socially and emotionally at a higher level.  Will develop more self-confidence to the point she is able to trust people more and feel comfortable sharing her thoughts and feelings.  Improve her self-confidence to where  she can feel and say "I am enough". Short term goal: Verbalize an understanding of the relationship between repressed anger and depressed mood. Strategies: Identify cognitive self talk that is engaged to support depression. Replace negative self-defeating self-talk with verbalization of realistic and positive cognitive messages. Resume exercising as that has helped her in the past with emotional distress     Plan:  Patient today showing lower motivation at first but as session went on, her level of motivation increased.  Did a good job of sharing more information regarding her past around specific issues that she feels a lot of guilt and frustration about.  Processed a lot of her feelings more openly and is to work also on more self forgiveness as noted above.  States that she has not talked this openly about the circumstances shared above, but has been in therapy previously.  Feels past negativity has stuck with her over the years and feels it will be hard to forgive herself and move forward.  Challenged patient to keep working on this in therapy and shared my belief in her that she is capable of giving herself and accepting herself as she is now in order to move forward in a more healthy direction.  Patient encouraged to use some behaviors that can help her between sessions including stopping her self negating and self defeating self talk, look for more positives within herself, improved communication patterns with people that she is hoping to have stronger connections with, intentionally look for more positives than negatives each day, allow herself to have more positive and realistic messages about herself past and present, decrease her judgmental/self-critical thoughts, take breaks as she can throughout the day, stay in contact with people that are supportive, remain in the present focusing on what she can control, get outside daily and walk, and recognize the strength encouraged she is showing in working with goal-directed behaviors in the midst of challenging circumstances as she tries to move forward in a more positive direction of improved emotional health.  Goal review and progress/challenges noted with patient.  Next appointment within 1 to 2 weeks.   Shanon Ace, LCSW

## 2021-03-30 ENCOUNTER — Encounter: Payer: Self-pay | Admitting: Adult Health

## 2021-03-30 ENCOUNTER — Telehealth (INDEPENDENT_AMBULATORY_CARE_PROVIDER_SITE_OTHER): Payer: Medicare PPO | Admitting: Adult Health

## 2021-03-30 DIAGNOSIS — F411 Generalized anxiety disorder: Secondary | ICD-10-CM | POA: Diagnosis not present

## 2021-03-30 DIAGNOSIS — G4700 Insomnia, unspecified: Secondary | ICD-10-CM

## 2021-03-30 DIAGNOSIS — F331 Major depressive disorder, recurrent, moderate: Secondary | ICD-10-CM | POA: Diagnosis not present

## 2021-03-30 DIAGNOSIS — F902 Attention-deficit hyperactivity disorder, combined type: Secondary | ICD-10-CM | POA: Diagnosis not present

## 2021-03-30 MED ORDER — QUETIAPINE FUMARATE 25 MG PO TABS
25.0000 mg | ORAL_TABLET | Freq: Every day | ORAL | 2 refills | Status: DC
Start: 2021-03-30 — End: 2021-06-30

## 2021-03-30 NOTE — Progress Notes (Signed)
Debra Buchanan [id number] 21-Apr-1956 65 y.o.  Virtual Visit via Video Note  I connected with pt @ on 03/31/21 at  4:40 PM EDT by a video enabled telemedicine application and verified that I am speaking with the correct person using two identifiers.   I discussed the limitations of evaluation and management by telemedicine and the availability of in person appointments. The patient expressed understanding and agreed to proceed.  I discussed the assessment and treatment plan with the patient. The patient was provided an opportunity to ask questions and all were answered. The patient agreed with the plan and demonstrated an understanding of the instructions.   The patient was advised to call back or seek an in-person evaluation if the symptoms worsen or if the condition fails to improve as anticipated.  I provided 30 minutes of non-face-to-face time during this encounter.  The patient was located at home.  The provider was located at [place].   Aloha Gell, NP   Subjective:   Patient ID:  Debra Buchanan is a 65 y.o. (DOB 1956-08-20) female.  Chief Complaint: No chief complaint on file.   HPI ANJALI MANZELLA presents for follow-up of adjustment issues, BPD, ADHD, anxiety, depression, and insomnia.  Describes mood today as "ok". Pleasant. Tearful at times. Mood symptoms - reports anxiety at times. Denies depression  and irritability. Stating "I'm doing ok as far as I know". Returned to work on August 8 - plans to retire on September 30th.  Has bought a house at [employer] and has been traveling back and forth moving her things. Reports an instance one night of getting lost or realizing she was not where she needed to be. Stable interest and motivation. Taking medications as prescribed. Seeing therapist Rinaldo Cloud.       Energy levels improved. Active, does not have a regular exercise routine.  Enjoys some usual interests and activities. Single. Lives alone. Has 2  daughters. Talking with family and friends.  Appetite adequate. Weight stable.  Sleeps better some nights than others. Averages 3 to 5 hours. Focus and concentration improved. Completing tasks. Managing aspects of household. Works as a Animal nutritionist.  Denies SI or HI.  Denies AH or VH.   Review of Systems:  Review of Systems  Musculoskeletal:  Negative for gait problem.  Neurological:  Negative for tremors.  Psychiatric/Behavioral:         Please refer to HPI   Medications: I have reviewed the patient's current medications.  Current Outpatient Medications  Medication Sig Dispense Refill   QUEtiapine (SEROQUEL) 25 MG tablet Take 1 tablet (25 mg total) by mouth at bedtime. 30 tablet 2   amphetamine-dextroamphetamine (ADDERALL) 30 MG tablet TAKE ONE (1) TABLET BY MOUTH EVERY DAY 30 tablet 0   aspirin EC 81 MG tablet Take 81 mg by mouth daily.     diazepam (VALIUM) 5 MG tablet Take one tablet daily. 30 tablet 2   donepezil (ARICEPT) 10 MG tablet Take 1 tablet by mouth daily.     Eszopiclone 3 MG TABS TAKE ONE TABLET (30 MG TOTAL) BY MOUTH AT BEDTIME. TAKE IMMEDIATELY BEFORE BEDTIME. 30 tablet 2   fexofenadine (ALLEGRA) 180 MG tablet Take 180 mg by mouth as needed.     fluticasone (FLONASE) 50 MCG/ACT nasal spray Place 1 spray into both nostrils in the morning.      lamoTRIgine (LAMICTAL) 200 MG tablet Take 1 tablet (200 mg total) by mouth at bedtime. 90 tablet 3  lamoTRIgine (LAMICTAL) 25 MG tablet Take two tablets every morning. 180 tablet 3   PEG-KCl-NaCl-NaSulf-Na Asc-C (PLENVU) 140 g SOLR Take 1 kit by mouth as directed. Manufacturer's coupon Universal coupon code:BIN: P2366821; GROUP: CL27517001; PCN: [id number]; ID: [id number]; PAY NO MORE $50; NO prior authorization 1 each 0   traZODone (DESYREL) 50 MG tablet TAKE ONE-HALF TABLET (25MG) TO ONE TABLET (50MG) BY MOUTH AT BEDTIME AS NEEDED FOR SLEEP 30 tablet 0   Valbenazine Tosylate (INGREZZA) 80 MG CAPS Take 1 capsule by mouth at  bedtime.     No current facility-administered medications for this visit.    Medication Side Effects: None  Allergies:  Allergies  Allergen Reactions   Grass Extracts  [Gramineae Pollens] Cough, Hives, Itching, Shortness Of Breath and Swelling   Shellfish Allergy Cough, Hives, Itching, Rash, Shortness Of Breath and Swelling   Iodine Swelling   Other Hives, Itching and Cough    Pine Nuts   Penicillins     Unsure of reaction    Past Medical History:  Diagnosis Date   ADHD    Allergy    Anemia    Anxiety    Bipolar disorder (HCC)    Depression    Insomnia    Memory impairment     Family History  Problem Relation Age of Onset   Breast cancer Mother    Emphysema Father    Heart attack Paternal Grandfather    Colon cancer Paternal Uncle    Esophageal cancer Neg Hx    Stomach cancer Neg Hx     Social History   Socioeconomic History   Marital status: Divorced    Spouse name: Not on file   Number of children: 2   Years of education: Not on file   Highest education level: Not on file  Occupational History   Occupation: Fish farm manager: [employer]  Tobacco Use   Smoking status: Never   Smokeless tobacco: Never  Vaping Use   Vaping Use: Never used  Substance and Sexual Activity   Alcohol use: Yes    Alcohol/week: 2.0 standard drinks    Types: 2 Standard drinks or equivalent per week    Comment: occasional, 1-2 per week   Drug use: No   Sexual activity: Not on file  Other Topics Concern   Not on file  Social History Narrative   Lives at home alone   Right-handed   Rare caffeine use   Social Determinants of Health   Financial Resource Strain: Not on file  Food Insecurity: Not on file  Transportation Needs: Not on file  Physical Activity: Not on file  Stress: Not on file  Social Connections: Not on file  Intimate Partner Violence: Not on file    Past Medical History, Surgical history, Social history, and Family history  were reviewed and updated as appropriate.   Please see review of systems for further details on the patient's review from today.   Objective:   Physical Exam:  There were no vitals taken for this visit.  Physical Exam Constitutional:      General: She is not in acute distress. Musculoskeletal:        General: No deformity.  Neurological:     Mental Status: She is alert and oriented to person, place, and time.     Coordination: Coordination normal.  Psychiatric:        Attention and Perception: Attention and perception normal. She does not perceive auditory or visual  hallucinations.        Mood and Affect: Mood normal. Mood is not anxious or depressed. Affect is not labile, blunt, angry or inappropriate.        Speech: Speech normal.        Behavior: Behavior normal.        Thought Content: Thought content normal. Thought content is not paranoid or delusional. Thought content does not include homicidal or suicidal ideation. Thought content does not include homicidal or suicidal plan.        Cognition and Memory: Cognition and memory normal.        Judgment: Judgment normal.     Comments: Insight intact    Lab Review:     Component Value Date/Time   NA 140 01/14/2020 1440   K 4.4 01/14/2020 1440   CL 104 01/14/2020 1440   CO2 30 01/14/2020 1440   GLUCOSE 89 01/14/2020 1440   BUN 16 01/14/2020 1440   CREATININE 0.89 01/14/2020 1440   CALCIUM 9.8 01/14/2020 1440   PROT 7.2 01/14/2020 1440   AST 14 01/14/2020 1440   ALT 19 01/14/2020 1440   BILITOT 0.6 01/14/2020 1440   GFRNONAA >60 09/29/2010 2228   GFRAA  09/29/2010 2228    >60        The eGFR has been calculated using the MDRD equation. This calculation has not been validated in all clinical situations. eGFR's persistently <60 mL/min signify possible Chronic Kidney Disease.       Component Value Date/Time   WBC 7.2 08/09/2020 1501   RBC 4.09 08/09/2020 1501   HGB 12.2 08/09/2020 1501   HCT 37.0 08/09/2020  1501   PLT 341 08/09/2020 1501   MCV 90.5 08/09/2020 1501   MCV 92.9 01/26/2013 1254   MCH 29.8 08/09/2020 1501   MCHC 33.0 08/09/2020 1501   RDW 12.7 08/09/2020 1501   LYMPHSABS 2.6 09/29/2010 2228   MONOABS 0.7 09/29/2010 2228   EOSABS 0.3 09/29/2010 2228   BASOSABS 0.0 09/29/2010 2228    No results found for: POCLITH, LITHIUM   No results found for: PHENYTOIN, PHENOBARB, VALPROATE, CBMZ   .res Assessment: Plan:    Plan:  Lunesta 20m at hs   Lamictal 2042mat hs and 5072mvery morning. Adderall 80m1mery morning  Add Seroquel 25mg9mhs  Melatonin  Recent BP check at PCP - WNL   RTC 4 weeks  Patient advised to contact office with any questions, adverse effects, or acute worsening in signs and symptoms.  Discussed potential benefits, risk, and side effects of benzodiazepines to include potential risk of tolerance and dependence, as well as possible drowsiness.  Advised patient not to drive if experiencing drowsiness and to take lowest possible effective dose to minimize risk of dependence and tolerance.  Counseled patient regarding potential benefits, risks, and side effects of Lamictal to include potential risk of Stevens-Johnson syndrome. Advised patient to stop taking Lamictal and contact office immediately if rash develops and to seek urgent medical attention if rash is severe and/or spreading quickly.  Discussed potential metabolic side effects associated with atypical antipsychotics, as well as potential risk for movement side effects. Advised pt to contact office if movement side effects occur.    Diagnoses and all orders for this visit:  Generalized anxiety disorder  Major depressive disorder, recurrent episode, moderate (HCC)  Insomnia, unspecified type -     QUEtiapine (SEROQUEL) 25 MG tablet; Take 1 tablet (25 mg total) by mouth at bedtime.  Attention deficit hyperactivity  disorder (ADHD), combined type    Please see After Visit Summary for patient  specific instructions.  Future Appointments  Date Time Provider [place]  04/07/2021  4:00 PM Shanon Ace, [place] [facility] None  04/20/2021  5:00 PM Shanon Ace, LCSW CP-[facility] None  05/04/2021  5:00 PM Shanon Ace, LCSW CP[facility] None    No orders of the defined types were placed in this encounter.     -------------------------------

## 2021-03-31 DIAGNOSIS — Z08 Encounter for follow-up examination after completed treatment for malignant neoplasm: Secondary | ICD-10-CM | POA: Diagnosis not present

## 2021-03-31 DIAGNOSIS — L818 Other specified disorders of pigmentation: Secondary | ICD-10-CM | POA: Diagnosis not present

## 2021-03-31 DIAGNOSIS — L570 Actinic keratosis: Secondary | ICD-10-CM | POA: Diagnosis not present

## 2021-03-31 DIAGNOSIS — Z85828 Personal history of other malignant neoplasm of skin: Secondary | ICD-10-CM | POA: Diagnosis not present

## 2021-03-31 DIAGNOSIS — X32XXXD Exposure to sunlight, subsequent encounter: Secondary | ICD-10-CM | POA: Diagnosis not present

## 2021-04-05 DIAGNOSIS — Z6828 Body mass index (BMI) 28.0-28.9, adult: Secondary | ICD-10-CM | POA: Diagnosis not present

## 2021-04-05 DIAGNOSIS — Z1231 Encounter for screening mammogram for malignant neoplasm of breast: Secondary | ICD-10-CM | POA: Diagnosis not present

## 2021-04-05 DIAGNOSIS — Z130 Encounter for screening for diseases of the blood and blood-forming organs and certain disorders involving the immune mechanism: Secondary | ICD-10-CM | POA: Diagnosis not present

## 2021-04-05 DIAGNOSIS — Z1389 Encounter for screening for other disorder: Secondary | ICD-10-CM | POA: Diagnosis not present

## 2021-04-05 DIAGNOSIS — Z01419 Encounter for gynecological examination (general) (routine) without abnormal findings: Secondary | ICD-10-CM | POA: Diagnosis not present

## 2021-04-06 ENCOUNTER — Other Ambulatory Visit: Payer: Self-pay | Admitting: Adult Health

## 2021-04-06 DIAGNOSIS — F902 Attention-deficit hyperactivity disorder, combined type: Secondary | ICD-10-CM

## 2021-04-07 ENCOUNTER — Ambulatory Visit (INDEPENDENT_AMBULATORY_CARE_PROVIDER_SITE_OTHER): Payer: Medicare PPO | Admitting: Psychiatry

## 2021-04-07 ENCOUNTER — Other Ambulatory Visit: Payer: Self-pay

## 2021-04-07 DIAGNOSIS — F411 Generalized anxiety disorder: Secondary | ICD-10-CM | POA: Diagnosis not present

## 2021-04-07 NOTE — Progress Notes (Signed)
[facility] Counselor/Therapist Progress Note  Patient ID: Debra Buchanan, MRN: [id number],    Date: 04/07/2021  Time Spent: 50 minutes  Treatment Type: Individual Therapy  Reported Symptoms: anxiety, stressed, some depression  Mental Status Exam:  Appearance:   Casual     Behavior:  Appropriate, Sharing, and Motivated  Motor:  Normal  Speech/Language:   Clear and Coherent  Affect:  anxious  Mood:  anxious and some depression  Thought process:  goal directed  Thought content:    Some obsessiveness  Sensory/Perceptual disturbances:    WNL  Orientation:  oriented to person, place, time/date, situation, day of week, month of year, year, and stated date of Aug. 10, 2022  Attention:  Good  Concentration:  Fair  Memory:  Wheeler of knowledge:   Good  Insight:    Good and Fair  Judgment:   Good  Impulse Control:  Good   Risk Assessment: Danger to Self:  No Self-injurious Behavior: No Danger to Others: No Duty to Warn:no Physical Aggression / Violence:No  Access to Firearms a concern: No  Gang Involvement:No   Subjective:   Patient in today reporting anxiety, some depression.  Quite stressed at work.  Planning to retire Sept. 30, 2022. Needed session today to vent a lot of understandable frustration, misunderstandings, uncertainty.  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  Wanted to focus today more about all the current stress going on prior to her retirement.  States "I get worked up at times and I do not know why but it is worse when stressed."  Patient did a good job and venting her frustrations, some misunderstandings, and uncertainties and seemed to feel supported and less stressed by the end of her conversation during session.  Also worked on some self calming skills for her to be able to use at work which is where her current concerns are right now.  Not as much "feeling down on myself"  since last appointment and still needing to work on self forgiveness.  Seemed less anxious upon leaving appointment.  Staying on medication with benefit per her report.   Interventions: Cognitive Behavioral Therapy, Ego-Supportive, and Insight-Oriented  Diagnosis:   ICD-10-CM   1. Generalized anxiety disorder  F41.1       Treatment goal plan:  Patient not signing treatment plan on computer screen due to Covid. Treatment goals: Treatment goals will remain on treatment plan as patient works to achieve her goals with strategies on goal plan.  Progress will be assessed each visit and documented "Progress" section of Note. Long term goal: Develop the ability to recognize, accept, and cope with feelings of depression. Alleviate depressed mood and function socially and emotionally at a higher level.  Will develop more self-confidence to the point she is able to trust people more and feel comfortable sharing her thoughts and feelings.  Improve her self-confidence to where she can feel and say "I am enough". Short term goal: Verbalize an understanding of the relationship between repressed anger and depressed mood. Strategies: Identify cognitive self talk that is engaged to support depression. Replace negative self-defeating self-talk with verbalization of realistic and positive cognitive messages. 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  Patient is to keep on working with stress reduction and anxiety management over the next month and up until her retirement while at work, and then be able to use them some in her personal life as well as she has previously reported anxiety outside the work environment.    Her  retirement is scheduled for May 28, 2021 and she seems ready.  Encouraged her to use some behaviors that can help between sessions including: Stop self negating and self defeating self talk, looking for more positives within herself, improved communication patterns with people that she is hoping to have stronger connections with, intentionally looking for more positives than negatives each day, allow herself to have more positive and realistic messages about herself past and present, decrease her judgmental/self-critical thoughts, take breaks as she can throughout the day, stay in contact with people that are supportive, remain in the present focusing on what she can control, get outside daily and walk, and feel good about the strength she is showing in working with goal-directed behaviors in the midst of challenging circumstances as she tries to move forward in a more positive direction of improved emotional health.  Goal review and progress/challenges noted with patient.  Next appointment within 2 to 3 weeks.   Shanon Ace, LCSW

## 2021-04-07 NOTE — Telephone Encounter (Signed)
Last filled 7/7

## 2021-04-08 DIAGNOSIS — M25512 Pain in left shoulder: Secondary | ICD-10-CM | POA: Diagnosis not present

## 2021-04-12 ENCOUNTER — Other Ambulatory Visit: Payer: Self-pay | Admitting: Adult Health

## 2021-04-12 DIAGNOSIS — F902 Attention-deficit hyperactivity disorder, combined type: Secondary | ICD-10-CM

## 2021-04-12 NOTE — Telephone Encounter (Signed)
Refuse duplicate please

## 2021-04-12 NOTE — Telephone Encounter (Signed)
Pt called and left a message that she needs adderall refill she has been waiting since 8/08. She is completely out. Please send ASAP

## 2021-04-20 ENCOUNTER — Other Ambulatory Visit: Payer: Self-pay

## 2021-04-20 ENCOUNTER — Ambulatory Visit (INDEPENDENT_AMBULATORY_CARE_PROVIDER_SITE_OTHER): Payer: Medicare PPO | Admitting: Psychiatry

## 2021-04-20 DIAGNOSIS — F411 Generalized anxiety disorder: Secondary | ICD-10-CM

## 2021-04-20 NOTE — Progress Notes (Signed)
[facility] Counselor/Therapist Progress Note  Patient ID: Debra Buchanan, MRN: [id number],    Date: 04/20/2021  Time Spent: 58 minutes   Treatment Type: Individual Therapy  Reported Symptoms: anxiety  Mental Status Exam:  Appearance:   Casual     Behavior:  Appropriate, Sharing, and Motivated  Motor:  Normal  Speech/Language:   Clear and Coherent  Affect:  anxious  Mood:  Significantly better  Thought process:  goal directed  Thought content:    Some obsessiveness  Sensory/Perceptual disturbances:    WNL  Orientation:  oriented to person, place, time/date, situation, day of week, month of year, year, and stated date of Aug. 23, 2022  Attention:  Good  Concentration:  Good  Memory:  WNL  Fund of knowledge:   Good  Insight:    Good and Fair  Judgment:   Good  Impulse Control:  Good   Risk Assessment: Danger to Self:  No Self-injurious Behavior: No Danger to Others: No Duty to Warn:no Physical Aggression / Violence:No  Access to Firearms a concern: No  Gang Involvement:No   Subjective: Patient in today reporting anxiety but overall mood has improved.  Setting better limits. Anxiety is elevated more due to her upcoming retirement, however is looking forward to being "free" and having more personal and family time. Handling some stress and pressures better at this point. Shared that after prior sessions when she was very emotional, she has felt "better after letting a lot of my feelings out" and have "been more leveled out" since that time and feels she is becoming more grounded and less affected by her past. Stressed with multiple upcoming changes: retirement, and moving out of town to where she will live. Discussed progress on her goals especially with the depression and better able to recognize and manage any depressive feeling but has noticed a significant reduction in depression, and anxiety improved.   Interventions: Cognitive Behavioral Therapy, Ego-Supportive,  and Insight-Oriented  Diagnosis:   ICD-10-CM   1. Generalized anxiety disorder  F41.1        Treatment goal plan:  Patient not signing treatment plan on computer screen due to Covid. Treatment goals: Treatment goals will remain on treatment plan as patient works to achieve her goals with strategies on goal plan.  Progress will be assessed each visit and documented "Progress" section of Note. Long term goal: Develop the ability to recognize, accept, and cope with feelings of depression. Alleviate depressed mood and function socially and emotionally at a higher level.  Will develop more self-confidence to the point she is able to trust people more and feel comfortable sharing her thoughts and feelings.  Improve her self-confidence to where she can feel and say "I am enough". Short term goal: Verbalize an understanding of the relationship between repressed anger and depressed mood. Strategies: Identify cognitive self talk that is engaged to support depression. Replace negative self-defeating self-talk with verbalization of realistic and positive cognitive messages. Resume exercising as that has helped her in the past with emotional distress    Plan: Patient today showing good motivation and feeling good about progress she has made in following her goal-directed behaviors.  After venting a lot of her concerns and emotions in sessions she has noticed feeling better and less held back by the past.  Having more self confidence.  Still struggles with some anxiety and is working diligently on that.  Self talk is improving, depressed mood is improving, functioning more socially, and feeling better  about herself.  She continues to work with her goals and has more determination and belief in herself.  Encouraged patient to practice some behaviors that have proven to be helpful to her including: Looking for positives within herself, stopping any self negating and self defeating self talk, practicing  consistent positive self talk, improved communication patterns with people that she is hoping to have stronger connections with, intentionally looking for more positives and negatives every day, allowing herself to experience more positive and realistic messages about herself past and present, decreasing her judgmental/self-critical thoughts, taking breaks as she can throughout the day, staying in contact with people that are supportive, getting outside daily and walking, remaining in the present focusing on what she can control, and recognizing the strength she is showing in working with goal-directed behaviors in the midst of challenging circumstances as she begins to move forward in a more positive direction of improved emotional health.  Goal review and progress/challenges noted with patient.  Next appointment within 2 to 3 weeks.  This record has been created using [employer].  Chart creation errors have been sought, but may not always have been located and corrected.  Such creation errors do not reflect on the standard of medical care.   Shanon Ace, LCSW

## 2021-04-29 DIAGNOSIS — M25511 Pain in right shoulder: Secondary | ICD-10-CM | POA: Diagnosis not present

## 2021-04-29 DIAGNOSIS — M542 Cervicalgia: Secondary | ICD-10-CM | POA: Diagnosis not present

## 2021-05-04 ENCOUNTER — Ambulatory Visit: Payer: Medicare PPO | Admitting: Psychiatry

## 2021-05-13 ENCOUNTER — Other Ambulatory Visit: Payer: Self-pay | Admitting: Adult Health

## 2021-05-13 ENCOUNTER — Ambulatory Visit: Payer: Medicare PPO | Admitting: Psychiatry

## 2021-05-13 DIAGNOSIS — F902 Attention-deficit hyperactivity disorder, combined type: Secondary | ICD-10-CM

## 2021-05-14 NOTE — Telephone Encounter (Signed)
Please send

## 2021-05-17 ENCOUNTER — Other Ambulatory Visit: Payer: Self-pay | Admitting: Adult Health

## 2021-05-17 DIAGNOSIS — F411 Generalized anxiety disorder: Secondary | ICD-10-CM

## 2021-05-18 NOTE — Telephone Encounter (Signed)
Please schedule appt

## 2021-05-19 DIAGNOSIS — M542 Cervicalgia: Secondary | ICD-10-CM | POA: Diagnosis not present

## 2021-05-19 DIAGNOSIS — M25511 Pain in right shoulder: Secondary | ICD-10-CM | POA: Diagnosis not present

## 2021-05-19 NOTE — Telephone Encounter (Signed)
This pt is not do for a follow up until November. She was seen in august

## 2021-05-20 ENCOUNTER — Ambulatory Visit: Payer: Medicare PPO | Admitting: Psychiatry

## 2021-05-20 NOTE — Telephone Encounter (Signed)
Last filled 7/2 f/u due in nov

## 2021-05-24 ENCOUNTER — Ambulatory Visit (HOSPITAL_COMMUNITY): Payer: Medicare PPO

## 2021-05-27 ENCOUNTER — Other Ambulatory Visit: Payer: Self-pay | Admitting: Adult Health

## 2021-05-27 ENCOUNTER — Ambulatory Visit: Payer: Medicare PPO | Admitting: Psychiatry

## 2021-05-27 DIAGNOSIS — G4700 Insomnia, unspecified: Secondary | ICD-10-CM

## 2021-05-27 DIAGNOSIS — M542 Cervicalgia: Secondary | ICD-10-CM | POA: Diagnosis not present

## 2021-05-27 DIAGNOSIS — M25511 Pain in right shoulder: Secondary | ICD-10-CM | POA: Diagnosis not present

## 2021-05-27 NOTE — Telephone Encounter (Signed)
She's over due for f/up

## 2021-05-31 DIAGNOSIS — M542 Cervicalgia: Secondary | ICD-10-CM | POA: Diagnosis not present

## 2021-05-31 DIAGNOSIS — G2401 Drug induced subacute dyskinesia: Secondary | ICD-10-CM | POA: Diagnosis not present

## 2021-05-31 DIAGNOSIS — Z79899 Other long term (current) drug therapy: Secondary | ICD-10-CM | POA: Diagnosis not present

## 2021-05-31 DIAGNOSIS — L570 Actinic keratosis: Secondary | ICD-10-CM | POA: Diagnosis not present

## 2021-05-31 DIAGNOSIS — G309 Alzheimer's disease, unspecified: Secondary | ICD-10-CM | POA: Diagnosis not present

## 2021-05-31 DIAGNOSIS — F319 Bipolar disorder, unspecified: Secondary | ICD-10-CM | POA: Diagnosis not present

## 2021-05-31 DIAGNOSIS — G629 Polyneuropathy, unspecified: Secondary | ICD-10-CM | POA: Diagnosis not present

## 2021-05-31 DIAGNOSIS — X32XXXD Exposure to sunlight, subsequent encounter: Secondary | ICD-10-CM | POA: Diagnosis not present

## 2021-05-31 DIAGNOSIS — R259 Unspecified abnormal involuntary movements: Secondary | ICD-10-CM | POA: Diagnosis not present

## 2021-05-31 DIAGNOSIS — G4700 Insomnia, unspecified: Secondary | ICD-10-CM | POA: Diagnosis not present

## 2021-05-31 DIAGNOSIS — R413 Other amnesia: Secondary | ICD-10-CM | POA: Diagnosis not present

## 2021-06-16 ENCOUNTER — Other Ambulatory Visit: Payer: Self-pay

## 2021-06-16 ENCOUNTER — Telehealth: Payer: Self-pay | Admitting: Adult Health

## 2021-06-16 DIAGNOSIS — F902 Attention-deficit hyperactivity disorder, combined type: Secondary | ICD-10-CM

## 2021-06-16 MED ORDER — AMPHETAMINE-DEXTROAMPHETAMINE 30 MG PO TABS: ORAL_TABLET | ORAL | 0 refills | Status: DC

## 2021-06-16 NOTE — Telephone Encounter (Signed)
Pended.

## 2021-06-16 NOTE — Telephone Encounter (Signed)
Pt called and said that she has changed pharmacies. She is now using [facility] under[facility]. Please send in the adderall  to this pharmacy

## 2021-06-21 ENCOUNTER — Other Ambulatory Visit: Payer: Self-pay | Admitting: Adult Health

## 2021-06-21 DIAGNOSIS — F411 Generalized anxiety disorder: Secondary | ICD-10-CM

## 2021-06-21 DIAGNOSIS — F331 Major depressive disorder, recurrent, moderate: Secondary | ICD-10-CM

## 2021-06-22 NOTE — Telephone Encounter (Signed)
Please schedule appt

## 2021-06-23 NOTE — Telephone Encounter (Signed)
Pt has an apt 11/2

## 2021-06-30 ENCOUNTER — Ambulatory Visit (INDEPENDENT_AMBULATORY_CARE_PROVIDER_SITE_OTHER): Payer: Medicare PPO | Admitting: Adult Health

## 2021-06-30 ENCOUNTER — Encounter: Payer: Self-pay | Admitting: Adult Health

## 2021-06-30 ENCOUNTER — Other Ambulatory Visit: Payer: Self-pay

## 2021-06-30 DIAGNOSIS — X32XXXD Exposure to sunlight, subsequent encounter: Secondary | ICD-10-CM | POA: Diagnosis not present

## 2021-06-30 DIAGNOSIS — M5412 Radiculopathy, cervical region: Secondary | ICD-10-CM | POA: Diagnosis not present

## 2021-06-30 DIAGNOSIS — F902 Attention-deficit hyperactivity disorder, combined type: Secondary | ICD-10-CM

## 2021-06-30 DIAGNOSIS — L570 Actinic keratosis: Secondary | ICD-10-CM | POA: Diagnosis not present

## 2021-06-30 DIAGNOSIS — F331 Major depressive disorder, recurrent, moderate: Secondary | ICD-10-CM

## 2021-06-30 DIAGNOSIS — G4700 Insomnia, unspecified: Secondary | ICD-10-CM

## 2021-06-30 DIAGNOSIS — L821 Other seborrheic keratosis: Secondary | ICD-10-CM | POA: Diagnosis not present

## 2021-06-30 DIAGNOSIS — F411 Generalized anxiety disorder: Secondary | ICD-10-CM | POA: Diagnosis not present

## 2021-06-30 MED ORDER — ESZOPICLONE 3 MG PO TABS: ORAL_TABLET | ORAL | 2 refills | Status: DC

## 2021-06-30 MED ORDER — QUETIAPINE FUMARATE 25 MG PO TABS: 25.0000 mg | ORAL_TABLET | Freq: Every day | ORAL | 2 refills | Status: DC

## 2021-06-30 MED ORDER — LAMOTRIGINE 200 MG PO TABS: 200.0000 mg | ORAL_TABLET | Freq: Every day | ORAL | 5 refills | Status: DC

## 2021-06-30 NOTE — Progress Notes (Signed)
Debra Buchanan [id number] 01-Oct-1955 65 y.o.  Subjective:   Patient ID:  Debra Buchanan is a 65 y.o. (DOB [birth year]) female.  Chief Complaint: No chief complaint on file.   HPI Debra Buchanan presents to the office today for follow-up of ADHD, anxiety, depression, and insomnia.  Describes mood today as "ok". Pleasant. Denies tearfulness. Mood symptoms - denies anxiety, depression, and irritability. Stating "I'm doing good". Feels like medications continue to work well. Recently retired and moved to [facility].  Stable interest and motivation. Taking medications as prescribed.  Energy levels improved. Active, does not have a regular exercise routine.  Enjoys some usual interests and activities. Single. Lives alone. Has 2 daughters. Talking with family and friends.  Appetite adequate. Weight stable.  Sleeps better some nights than others. Averages 3 to 5 hours. Focus and concentration improved. Completing tasks. Managing aspects of household. Recently retired. Denies SI or HI.  Denies AH or VH.  [facility] from 12/08/2020 in [facility] No Risk        Review of Systems:  Review of Systems  Musculoskeletal:  Negative for gait problem.  Neurological:  Negative for tremors.  Psychiatric/Behavioral:         Please refer to HPI   Medications: I have reviewed the patient's current medications.  Current Outpatient Medications  Medication Sig Dispense Refill   amphetamine-dextroamphetamine (ADDERALL) 30 MG tablet TAKE ONE (1) TABLET BY MOUTH EVERY DAY 30 tablet 0   aspirin EC 81 MG tablet Take 81 mg by mouth daily.     diazepam (VALIUM) 5 MG tablet TAKE ONE (1) TABLET BY MOUTH EVERY DAY 30 tablet 2   donepezil (ARICEPT) 10 MG tablet Take 1 tablet by mouth daily.     Eszopiclone 3 MG TABS Take immediately before bedtime 30 tablet 2   fexofenadine (ALLEGRA) 180 MG tablet Take 180 mg by mouth as needed.     fluticasone  (FLONASE) 50 MCG/ACT nasal spray Place 1 spray into both nostrils in the morning.      lamoTRIgine (LAMICTAL) 200 MG tablet Take 1 tablet (200 mg total) by mouth at bedtime. 30 tablet 5   PEG-KCl-NaCl-NaSulf-Na Asc-C (PLENVU) 140 g SOLR Take 1 kit by mouth as directed. Manufacturer's coupon Universal coupon code:BIN: P2366821; GROUP: [id number]; PCN: [id number]; ID: [id number]; PAY NO MORE $50; NO prior authorization 1 each 0   QUEtiapine (SEROQUEL) 25 MG tablet Take 1 tablet (25 mg total) by mouth at bedtime. 30 tablet 2   Valbenazine Tosylate (INGREZZA) 80 MG CAPS Take 1 capsule by mouth at bedtime.     No current facility-administered medications for this visit.    Medication Side Effects: None  Allergies:  Allergies  Allergen Reactions   Grass Extracts  [Gramineae Pollens] Cough, Hives, Itching, Shortness Of Breath and Swelling   Iodine Swelling, Anaphylaxis and Rash   Shellfish Allergy Cough, Hives, Itching, Rash, Shortness Of Breath and Swelling   Shellfish-Derived Products Anaphylaxis   Other Hives, Itching and Cough    Pine Nuts   Penicillins     Unsure of reaction Other reaction(s): Other (See Comments) Unsure of reaction    Past Medical History:  Diagnosis Date   ADHD    Allergy    Anemia    Anxiety    Bipolar disorder ([place])    Depression    Insomnia    Memory impairment     Past Medical History, Surgical history, Social history, and  Family history were reviewed and updated as appropriate.   Please see review of systems for further details on the patient's review from today.   Objective:   Physical Exam:  There were no vitals taken for this visit.  Physical Exam Constitutional:      General: She is not in acute distress. Musculoskeletal:        General: No deformity.  Neurological:     Mental Status: She is alert and oriented to person, place, and time.     Coordination: Coordination normal.  Psychiatric:        Attention and Perception: Attention and  perception normal. She does not perceive auditory or visual hallucinations.        Mood and Affect: Mood normal. Mood is not anxious or depressed. Affect is not labile, blunt, angry or inappropriate.        Speech: Speech normal.        Behavior: Behavior normal.        Thought Content: Thought content normal. Thought content is not paranoid or delusional. Thought content does not include homicidal or suicidal ideation. Thought content does not include homicidal or suicidal plan.        Cognition and Memory: Cognition and memory normal.        Judgment: Judgment normal.     Comments: Insight intact    Lab Review:     Component Value Date/Time   NA 140 01/14/2020 1440   K 4.4 01/14/2020 1440   CL 104 01/14/2020 1440   CO2 30 01/14/2020 1440   GLUCOSE 89 01/14/2020 1440   BUN 16 01/14/2020 1440   CREATININE 0.89 01/14/2020 1440   CALCIUM 9.8 01/14/2020 1440   PROT 7.2 01/14/2020 1440   AST 14 01/14/2020 1440   ALT 19 01/14/2020 1440   BILITOT 0.6 01/14/2020 1440   GFRNONAA >60 09/29/2010 2228   GFRAA  09/29/2010 2228    >60        The eGFR has been calculated using the MDRD equation. This calculation has not been validated in all clinical situations. eGFR's persistently <60 mL/min signify possible Chronic Kidney Disease.       Component Value Date/Time   WBC 7.2 08/09/2020 1501   RBC 4.09 08/09/2020 1501   HGB 12.2 08/09/2020 1501   HCT 37.0 08/09/2020 1501   PLT 341 08/09/2020 1501   MCV 90.5 08/09/2020 1501   MCV 92.9 01/26/2013 1254   MCH 29.8 08/09/2020 1501   MCHC 33.0 08/09/2020 1501   RDW 12.7 08/09/2020 1501   LYMPHSABS 2.6 09/29/2010 2228   MONOABS 0.7 09/29/2010 2228   EOSABS 0.3 09/29/2010 2228   BASOSABS 0.0 09/29/2010 2228    No results found for: POCLITH, LITHIUM   No results found for: PHENYTOIN, PHENOBARB, VALPROATE, CBMZ   .res Assessment: Plan:    Plan:  Lunesta 47m at hs   Lamictal 2091mat hs  Add Seroquel 2546mt hs  RTC 4  weeks  Patient advised to contact office with any questions, adverse effects, or acute worsening in signs and symptoms  Counseled patient regarding potential benefits, risks, and side effects of Lamictal to include potential risk of Stevens-Johnson syndrome. Advised patient to stop taking Lamictal and contact office immediately if rash develops and to seek urgent medical attention if rash is severe and/or spreading quickly.  Discussed potential metabolic side effects associated with atypical antipsychotics, as well as potential risk for movement side effects. Advised pt to contact office if movement side effects  occur.   Diagnoses and all orders for this visit:  Attention deficit hyperactivity disorder (ADHD), combined type  Insomnia, unspecified type -     QUEtiapine (SEROQUEL) 25 MG tablet; Take 1 tablet (25 mg total) by mouth at bedtime. -     Eszopiclone 3 MG TABS; Take immediately before bedtime  Major depressive disorder, recurrent episode, moderate (HCC) -     lamoTRIgine (LAMICTAL) 200 MG tablet; Take 1 tablet (200 mg total) by mouth at bedtime.  Generalized anxiety disorder -     lamoTRIgine (LAMICTAL) 200 MG tablet; Take 1 tablet (200 mg total) by mouth at bedtime.    Please see After Visit Summary for patient specific instructions.  No future appointments.  No orders of the defined types were placed in this encounter.   -------------------------------

## 2021-07-08 DIAGNOSIS — M25511 Pain in right shoulder: Secondary | ICD-10-CM | POA: Diagnosis not present

## 2021-07-08 DIAGNOSIS — M542 Cervicalgia: Secondary | ICD-10-CM | POA: Diagnosis not present

## 2021-07-12 DIAGNOSIS — M542 Cervicalgia: Secondary | ICD-10-CM | POA: Diagnosis not present

## 2021-07-12 DIAGNOSIS — M25511 Pain in right shoulder: Secondary | ICD-10-CM | POA: Diagnosis not present

## 2021-07-19 DIAGNOSIS — M25511 Pain in right shoulder: Secondary | ICD-10-CM | POA: Diagnosis not present

## 2021-07-19 DIAGNOSIS — M542 Cervicalgia: Secondary | ICD-10-CM | POA: Diagnosis not present

## 2021-07-26 DIAGNOSIS — M25511 Pain in right shoulder: Secondary | ICD-10-CM | POA: Diagnosis not present

## 2021-07-26 DIAGNOSIS — M542 Cervicalgia: Secondary | ICD-10-CM | POA: Diagnosis not present

## 2021-08-13 ENCOUNTER — Other Ambulatory Visit: Payer: Self-pay | Admitting: Adult Health

## 2021-08-13 DIAGNOSIS — F411 Generalized anxiety disorder: Secondary | ICD-10-CM

## 2021-09-22 ENCOUNTER — Other Ambulatory Visit: Payer: Self-pay | Admitting: Adult Health

## 2021-10-03 IMAGING — MG MM DIGITAL DIAGNOSTIC UNILAT*L* W/ TOMO W/ CAD
6 series · 6 of 18 positions shown · non-contrast
Comparison: Previous exam(s).

CLINICAL DATA: Patient was called back from screening mammogram for
possible distortion in the left breast.

EXAM:
DIGITAL DIAGNOSTIC UNILATERAL LEFT MAMMOGRAM WITH CAD AND TOMO

[L ML synth-2D]
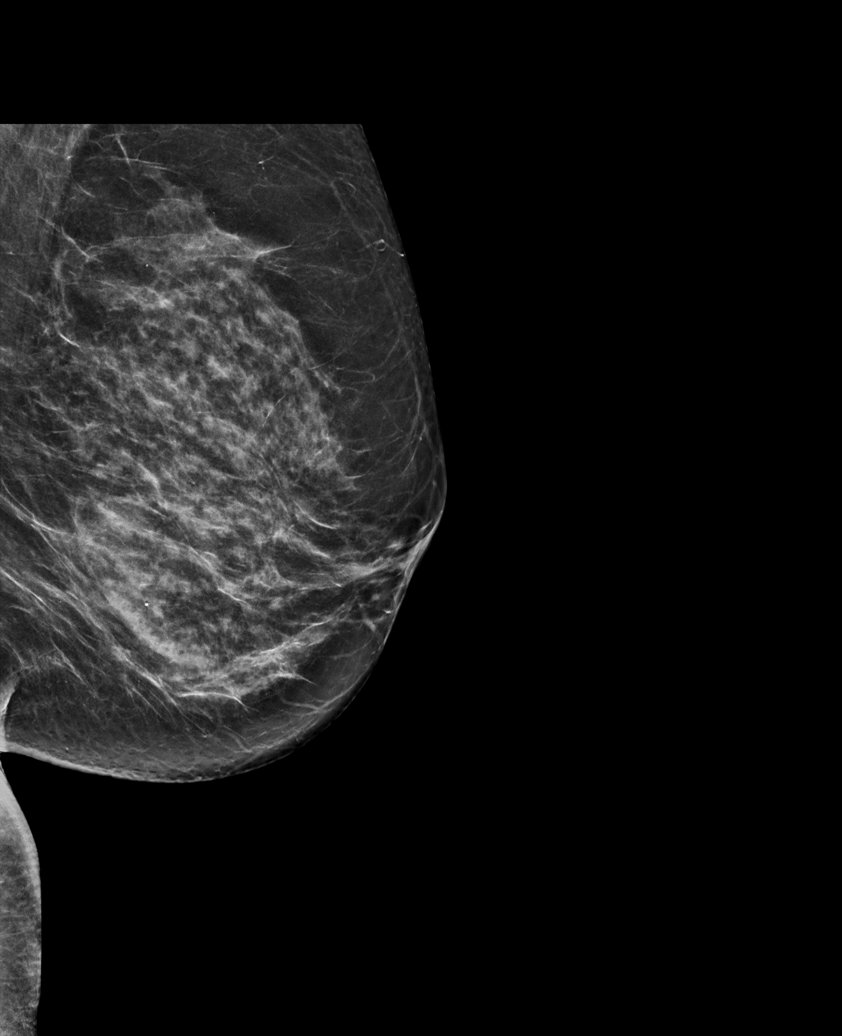

[L CC synth-2D]
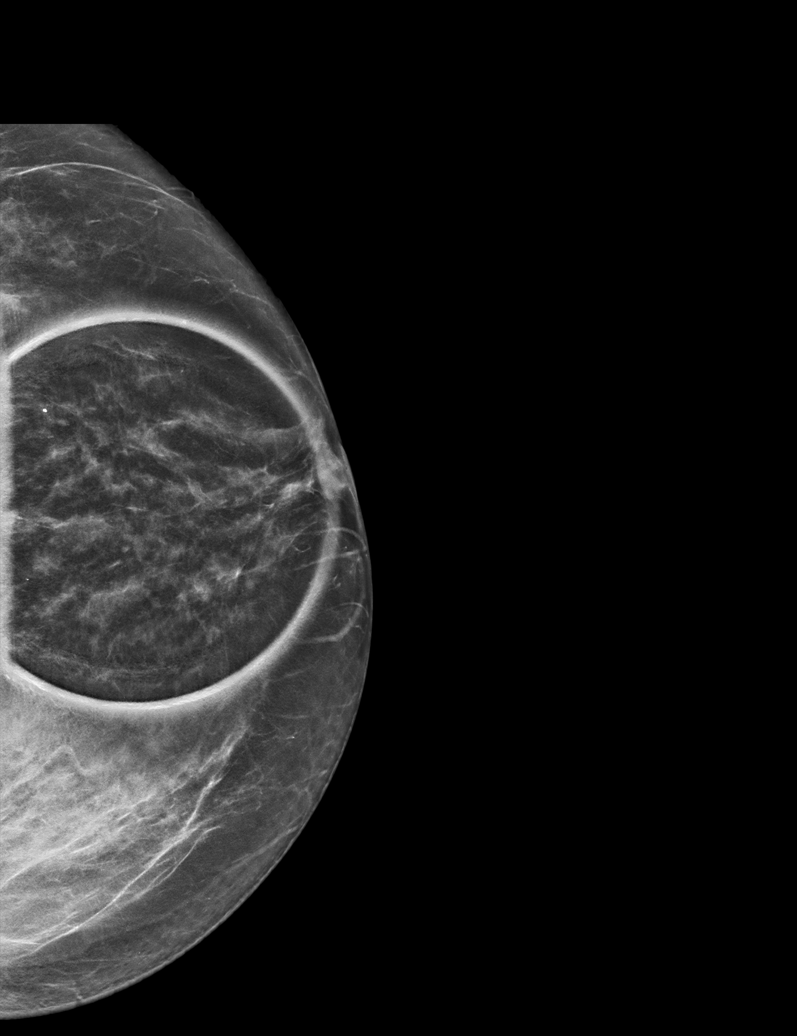

[L MLO synth-2D]
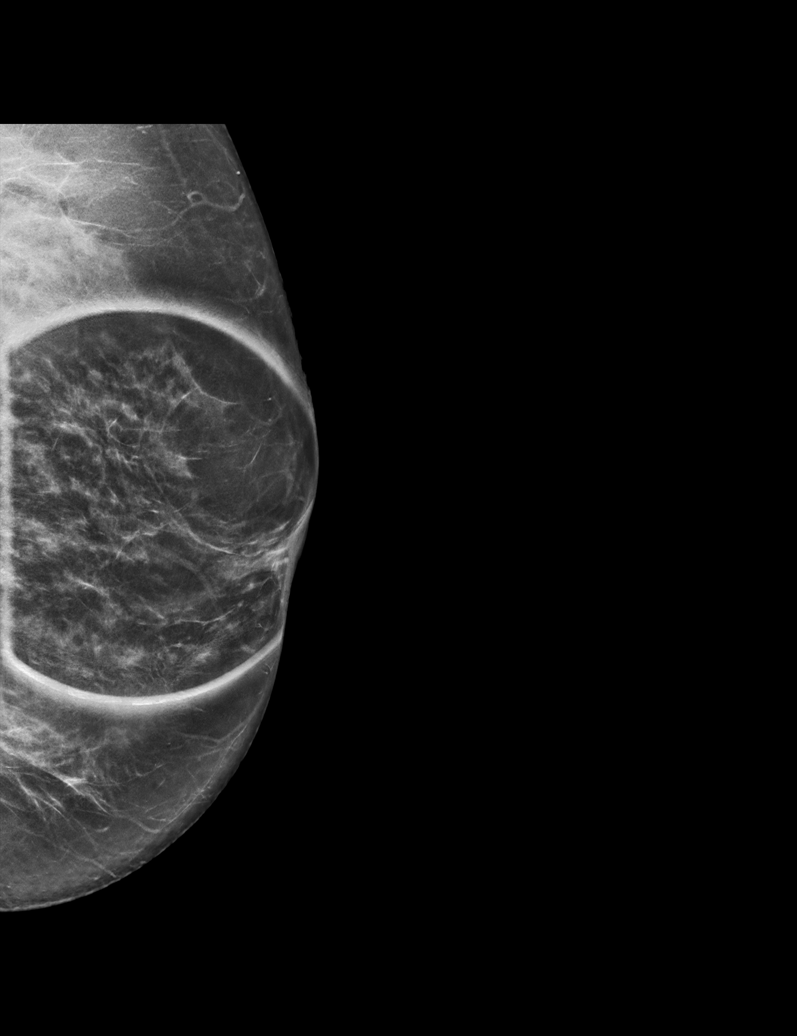

[L ML tomo · tomo slice 36/71.0]
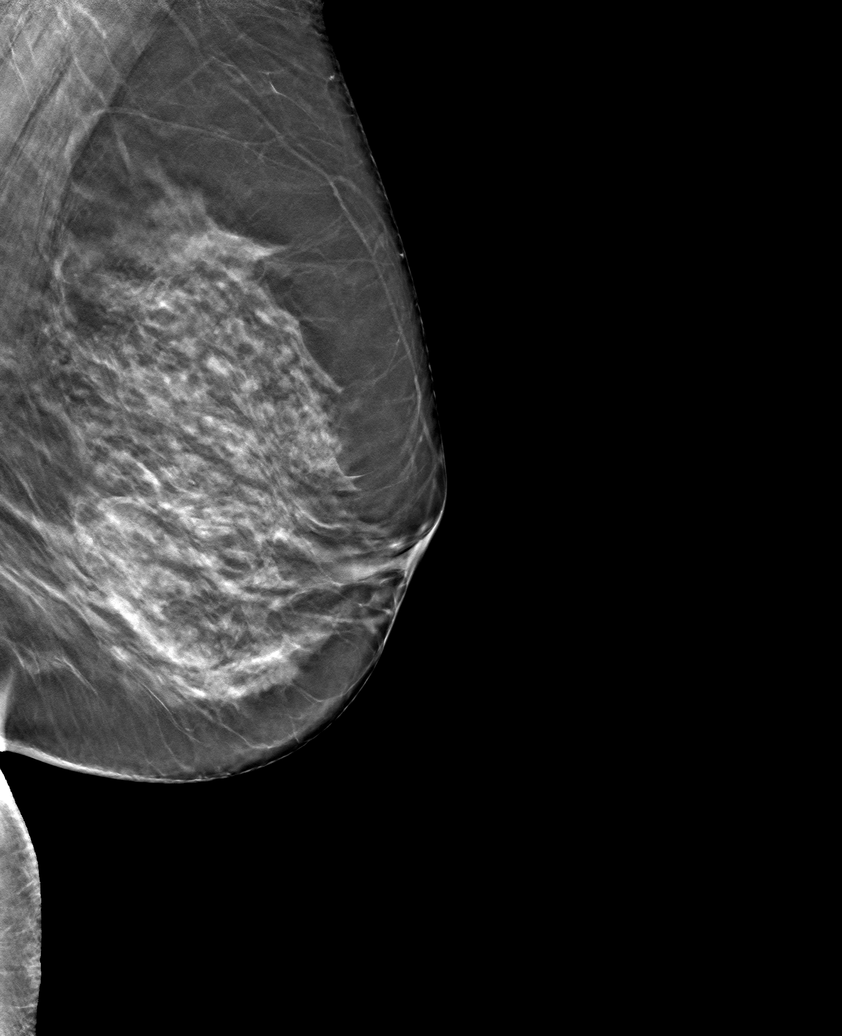

[L MLO tomo · tomo slice 33/65.0]
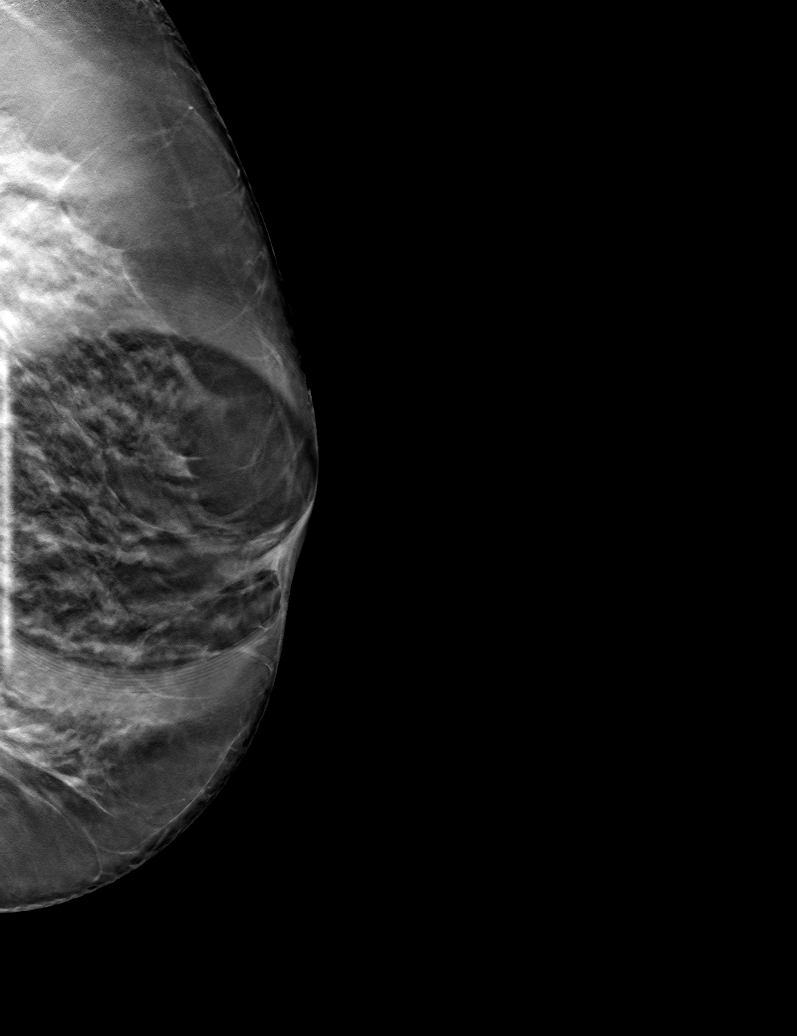

[L CC tomo · tomo slice 29/57.0]
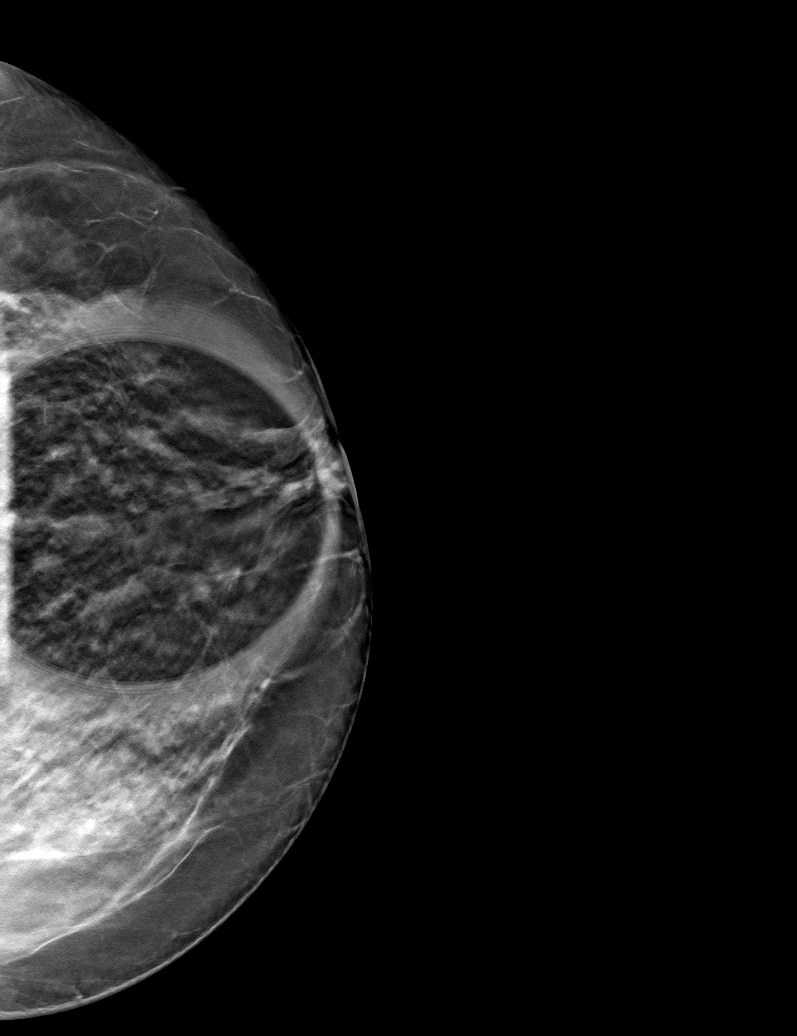

[6 of 18 positions shown; findings below may reference images not displayed]

ACR Breast Density Category c: The breast tissue is heterogeneously
dense, which may obscure small masses.
FINDINGS: Additional imaging of the left breast was performed. No persistent
distortion, mass or malignant type microcalcifications identified.

Mammographic images were processed with CAD.
IMPRESSION: No evidence of malignancy in the left breast.

RECOMMENDATION:
Bilateral screening mammogram in 1 year is recommended.

I have discussed the findings and recommendations with the patient.
If applicable, a reminder letter will be sent to the patient
regarding the next appointment.

BI-RADS CATEGORY  1: Negative.

## 2021-10-15 ENCOUNTER — Other Ambulatory Visit: Payer: Self-pay | Admitting: Adult Health

## 2021-10-15 DIAGNOSIS — G4700 Insomnia, unspecified: Secondary | ICD-10-CM

## 2021-11-16 ENCOUNTER — Other Ambulatory Visit: Payer: Self-pay | Admitting: Adult Health

## 2021-11-16 DIAGNOSIS — G4700 Insomnia, unspecified: Secondary | ICD-10-CM

## 2021-11-17 NOTE — Telephone Encounter (Signed)
Last filled 2/17 appt due in November

## 2021-11-19 ENCOUNTER — Telehealth: Payer: Self-pay | Admitting: Adult Health

## 2021-11-19 NOTE — Telephone Encounter (Signed)
What is he prescribing it to her for? And is that something we treat here? ?

## 2021-11-19 NOTE — Telephone Encounter (Signed)
Pt will call back to inform dose ?

## 2021-11-19 NOTE — Telephone Encounter (Signed)
Patient called regarding a medication. States that the medication [place] is prescribed by another provider but she has been having trouble getting the prescription filled. She would like to know if RM would instead prescribe the medication to her. She also asked if she would need to make an appointment for this is something that can be discussed over the phone with RM. Patient has moved away. Please rtc [phone] ?

## 2021-11-19 NOTE — Telephone Encounter (Signed)
Pt is referring to Austedo.Her neurologist does not send rx on time and she wants to know if you can instead prescribe it for her.

## 2021-11-19 NOTE — Telephone Encounter (Signed)
Ok we can take care of it. Go ahead and send it in for her.

## 2021-11-19 NOTE — Telephone Encounter (Signed)
Pt stated it's prescribed for her "muscle tics" that developed from taking other psychotropic meds.

## 2021-11-22 NOTE — Telephone Encounter (Signed)
See message. According to last Neurology note from [facility] patient is on [place]. There is no mention of Austedo. Would she take both medications?  ?

## 2021-11-22 NOTE — Telephone Encounter (Signed)
Debra Buchanan called and LM on 3/27 at 12:41pm- She takes Austedo '12mg'$   2 in the am and '12mg'$  2 in the pm. ?

## 2021-11-24 DIAGNOSIS — E782 Mixed hyperlipidemia: Secondary | ICD-10-CM | POA: Diagnosis not present

## 2021-11-24 DIAGNOSIS — Z0000 Encounter for general adult medical examination without abnormal findings: Secondary | ICD-10-CM | POA: Diagnosis not present

## 2021-11-26 DIAGNOSIS — R259 Unspecified abnormal involuntary movements: Secondary | ICD-10-CM | POA: Diagnosis not present

## 2021-11-26 DIAGNOSIS — Z79899 Other long term (current) drug therapy: Secondary | ICD-10-CM | POA: Diagnosis not present

## 2021-11-26 DIAGNOSIS — G3184 Mild cognitive impairment, so stated: Secondary | ICD-10-CM | POA: Diagnosis not present

## 2021-11-26 DIAGNOSIS — F319 Bipolar disorder, unspecified: Secondary | ICD-10-CM | POA: Diagnosis not present

## 2021-11-29 ENCOUNTER — Other Ambulatory Visit: Payer: Self-pay | Admitting: Adult Health

## 2021-11-29 NOTE — Progress Notes (Signed)
Called and spoke w patient. ?

## 2021-11-29 NOTE — Telephone Encounter (Signed)
I called and spoke with patient and she was recently started on Austedo through neurology and is asking for me to continue. I have asked her to have pharmacy send over a refill request.

## 2021-11-29 NOTE — Telephone Encounter (Signed)
Patient was last seen in November with RTC in 4 weeks according to your note. I called her about the Austedo today and she asked when she was supposed to be seen next. I told her 4 weeks and she said you told her she didn't need to be seen for a year. Please clarify.  ?

## 2021-11-30 DIAGNOSIS — Z111 Encounter for screening for respiratory tuberculosis: Secondary | ICD-10-CM | POA: Diagnosis not present

## 2021-12-01 NOTE — Telephone Encounter (Signed)
I do not see a request yet for Austedo, it does need to go to her speciality pharmacy. We typically use [facility] but she may have another one.  ?

## 2021-12-02 DIAGNOSIS — Z111 Encounter for screening for respiratory tuberculosis: Secondary | ICD-10-CM | POA: Diagnosis not present

## 2021-12-08 DIAGNOSIS — Z604 Social exclusion and rejection: Secondary | ICD-10-CM | POA: Diagnosis not present

## 2021-12-08 DIAGNOSIS — R030 Elevated blood-pressure reading, without diagnosis of hypertension: Secondary | ICD-10-CM | POA: Diagnosis not present

## 2021-12-08 DIAGNOSIS — G309 Alzheimer's disease, unspecified: Secondary | ICD-10-CM | POA: Diagnosis not present

## 2021-12-08 DIAGNOSIS — M792 Neuralgia and neuritis, unspecified: Secondary | ICD-10-CM | POA: Diagnosis not present

## 2021-12-08 DIAGNOSIS — Z8673 Personal history of transient ischemic attack (TIA), and cerebral infarction without residual deficits: Secondary | ICD-10-CM | POA: Diagnosis not present

## 2021-12-08 DIAGNOSIS — Z7982 Long term (current) use of aspirin: Secondary | ICD-10-CM | POA: Diagnosis not present

## 2021-12-08 DIAGNOSIS — F411 Generalized anxiety disorder: Secondary | ICD-10-CM | POA: Diagnosis not present

## 2021-12-08 DIAGNOSIS — Z803 Family history of malignant neoplasm of breast: Secondary | ICD-10-CM | POA: Diagnosis not present

## 2021-12-08 DIAGNOSIS — F319 Bipolar disorder, unspecified: Secondary | ICD-10-CM | POA: Diagnosis not present

## 2021-12-09 DIAGNOSIS — Z0000 Encounter for general adult medical examination without abnormal findings: Secondary | ICD-10-CM | POA: Diagnosis not present

## 2021-12-09 DIAGNOSIS — Z139 Encounter for screening, unspecified: Secondary | ICD-10-CM | POA: Diagnosis not present

## 2021-12-29 ENCOUNTER — Ambulatory Visit (INDEPENDENT_AMBULATORY_CARE_PROVIDER_SITE_OTHER): Payer: Medicare PPO | Admitting: Psychiatry

## 2021-12-29 DIAGNOSIS — F331 Major depressive disorder, recurrent, moderate: Secondary | ICD-10-CM

## 2021-12-29 DIAGNOSIS — F319 Bipolar disorder, unspecified: Secondary | ICD-10-CM | POA: Diagnosis not present

## 2021-12-29 DIAGNOSIS — Z79899 Other long term (current) drug therapy: Secondary | ICD-10-CM | POA: Diagnosis not present

## 2021-12-29 DIAGNOSIS — F32A Depression, unspecified: Secondary | ICD-10-CM | POA: Diagnosis not present

## 2021-12-29 DIAGNOSIS — G3184 Mild cognitive impairment, so stated: Secondary | ICD-10-CM | POA: Diagnosis not present

## 2021-12-29 DIAGNOSIS — R259 Unspecified abnormal involuntary movements: Secondary | ICD-10-CM | POA: Diagnosis not present

## 2021-12-29 NOTE — Progress Notes (Signed)
?    [facility] Counselor/Therapist Progress Note ? ?Patient ID: Debra Buchanan, MRN: [id number],   ? ?Date: 12/29/2021 ? ?Time Spent: 55 minutes  ? ?Virtual Visit via Telehealth Note: MyChart Video session ?Connected with patient by a telemedicine/telehealth application, with their informed consent, and verified patient privacy and that I am speaking with the correct person using two identifiers. I discussed the limitations, risks, security and privacy concerns of performing psychotherapy and the availability of in person appointments. I also discussed with the patient that there may be a patient responsible charge related to this service. The patient expressed understanding and agreed to proceed. ?I discussed the treatment planning with the patient. The patient was provided an opportunity to ask questions and all were answered. The patient agreed with the plan and demonstrated an understanding of the instructions. The patient was advised to call  our office if  symptoms worsen or feel they are in a crisis state and need immediate contact. ?  ?Therapist Location: office ?Patient Location: home ?  ?Treatment Type: Individual Therapy ? ?Reported Symptoms: depression, anxiety, difficulty sleeping ? ?Mental Status Exam: ? ?Appearance:   Casual     ?Behavior:  Appropriate, Sharing, and Motivated  ?Motor:  Normal  ?Speech/Language:   Clear and Coherent  ?Affect:  Depressed  ?Mood:  anxious and depressed  ?Thought process:  goal directed  ?Thought content:    overthinking  ?Sensory/Perceptual disturbances:    WNL  ?Orientation:  oriented to person, place, time/date, situation, day of week, month of year, year, and stated date of Dec 29, 2021  ?Attention:  Good  ?Concentration:  Good  ?Memory:  WNL  ?Fund of knowledge:   Good  ?Insight:    Good  ?Judgment:   Good  ?Impulse Control:  Good  ? ?Risk Assessment: ?Danger to Self:  No ?Self-injurious Behavior: No ?Danger to Others: No ?Duty to Warn:no ?Physical Aggression /  Violence:No  ?Access to Firearms a concern: No  ?Gang Involvement:No  ? ?Subjective:   Patient today reporting depression and some anxiety, gained 15lbs since last October. More depressed, anxious, lack of interest and some decrease in motivation.  Depression is stronger symptom. [place] Had started off well being involved in multiple activities, meeting new people, but began isolating some, current feelings of more loneliness. Denies any SI. Hasn't been out of house the past 3 days but did meet best friend for dinner 4 nights ago. Best friend will be returning to her regular home in [place], [place]. Patient does have a commitment to attend an event in [place] at the end of May. Looked at some strategies/behaviors that can help her at this point including getting out of the house at least x3 daily, journaling, speaking to others more, going to dance visits, being willing to speak first upon meeting people, check out the church nearby that she has been wanting to visit, listen to music/podcasts on her phone, walk some daily, refraining from "staying stuck in my house."  Specific journaling re: behavior changes and any changes in mood/symptoms. ?"Adjusting to retirement" issues which she processed in more detail.  ? ?Interventions: Solution-Oriented/Positive Psychology and Insight-Oriented ? ?Treatment goal plan:  ?Patient not signing treatment plan on computer screen due to Covid. ?Treatment goals: ?Treatment goals will remain on treatment plan as patient works to achieve her goals with strategies on goal plan.  Progress will be assessed each visit and documented "Progress" section of Note. ?Long term goal: ?Develop  the ability to recognize, accept, and cope with feelings of depression. Alleviate depressed mood and function socially and emotionally at a higher level.  Will develop more self-confidence to the point she is able to trust people more and feel comfortable  sharing her thoughts and feelings.  Improve her self-confidence to where she can feel and say "I am enough". ?Short term goal: ?Verbalize an understanding of the relationship between repressed anger and depressed mood. ?Strategies: ?Identify cognitive self talk that is engaged to support depression. ?Replace negative self-defeating self-talk with verbalization of realistic and positive cognitive messages. ?Resume exercising as that has helped her in the past with emotional distress ?  ?Diagnosis: ?  ICD-10-CM   ?1. Major depressive disorder, recurrent episode, moderate (HCC)  F33.1   ?  ? ?Plan:  Patient showing good motivation and active participation in session today as she worked on sharing her recent decline in mood, increased depression, some anxiety, staying isolated and experiencing more loneliness after having moved to the [employer] following retirement.  As noted above talked about several strategies/behavior changes which she plans to start immediately and also plans to do some journaling in reference to her feelings, behavior changes as she accomplishes them, and any changes in mood as a result of behavior changes.  Also to follow up on a list we made jointly of behavior changes that could prove to be helpful for her.  Sounded increasingly motivated the more we talked.  To call again in reference to a follow-up appointment within 2 weeks based on her scheduling.  States she is to start a part-time job within a few weeks.  Encouraged patient in practicing more positive behaviors including: Looking for the positives within herself, stop self-defeating/self negating self talk, work towards more consistent positive self talk, improved communication patterns with people that she is hoping to have stronger connections with, intentionally look for more positives versus negatives each day, decrease her judgmental/self-critical thoughts, take breaks as she can throughout the day, staying in contact with people that  are supportive, getting outside daily and walking, remaining in the present focusing on what she can control, and realize the strength she shows when working with goal-directed behaviors in the midst of challenging circumstances as she continues to adjust to significant changes over the past few months. ? ?Goal review and progress/challenges noted with patient. ? ?Next appointment within 2 to 3 weeks. ? ?This record has been created using [employer].  Chart creation errors have been sought, but may not always have been located and corrected.  Such creation errors do not reflect on the standard of medical care provided. ? ? ?Shanon Ace, LCSW ? ? ? ? ? ? ? ? ? ? ? ? ? ? ? ? ? ? ?

## 2022-01-06 ENCOUNTER — Ambulatory Visit: Payer: Medicare PPO | Admitting: Psychiatry

## 2022-01-21 ENCOUNTER — Other Ambulatory Visit: Payer: Self-pay | Admitting: Adult Health

## 2022-01-21 DIAGNOSIS — G4700 Insomnia, unspecified: Secondary | ICD-10-CM

## 2022-02-16 ENCOUNTER — Other Ambulatory Visit: Payer: Self-pay | Admitting: Adult Health

## 2022-02-16 DIAGNOSIS — G4700 Insomnia, unspecified: Secondary | ICD-10-CM

## 2022-02-16 DIAGNOSIS — F331 Major depressive disorder, recurrent, moderate: Secondary | ICD-10-CM

## 2022-02-16 DIAGNOSIS — F411 Generalized anxiety disorder: Secondary | ICD-10-CM

## 2022-02-16 NOTE — Telephone Encounter (Signed)
Last filled 5/26, due 6/23

## 2022-02-23 DIAGNOSIS — F32A Depression, unspecified: Secondary | ICD-10-CM | POA: Diagnosis not present

## 2022-02-23 DIAGNOSIS — G3184 Mild cognitive impairment, so stated: Secondary | ICD-10-CM | POA: Diagnosis not present

## 2022-02-23 DIAGNOSIS — R259 Unspecified abnormal involuntary movements: Secondary | ICD-10-CM | POA: Diagnosis not present

## 2022-02-23 DIAGNOSIS — Z79899 Other long term (current) drug therapy: Secondary | ICD-10-CM | POA: Diagnosis not present

## 2022-03-03 DIAGNOSIS — U071 COVID-19: Secondary | ICD-10-CM | POA: Diagnosis not present

## 2022-04-22 ENCOUNTER — Other Ambulatory Visit: Payer: Self-pay | Admitting: Adult Health

## 2022-04-22 DIAGNOSIS — G4700 Insomnia, unspecified: Secondary | ICD-10-CM

## 2022-04-28 DIAGNOSIS — L820 Inflamed seborrheic keratosis: Secondary | ICD-10-CM | POA: Diagnosis not present

## 2022-06-06 ENCOUNTER — Telehealth: Payer: Self-pay | Admitting: Adult Health

## 2022-06-06 ENCOUNTER — Other Ambulatory Visit: Payer: Self-pay

## 2022-06-06 DIAGNOSIS — G4700 Insomnia, unspecified: Secondary | ICD-10-CM

## 2022-06-06 MED ORDER — ESZOPICLONE 3 MG PO TABS: ORAL_TABLET | ORAL | 2 refills | Status: DC

## 2022-06-06 NOTE — Telephone Encounter (Signed)
Pended.

## 2022-06-06 NOTE — Telephone Encounter (Signed)
Pt called and said that she needs a refill on her generic lunesta. She has moved and needs it sent to [facility] in [place]

## 2022-06-13 ENCOUNTER — Telehealth: Payer: Self-pay | Admitting: Adult Health

## 2022-06-13 ENCOUNTER — Other Ambulatory Visit: Payer: Self-pay

## 2022-06-13 MED ORDER — AUSTEDO 12 MG PO TABS: 12.0000 mg | ORAL_TABLET | Freq: Two times a day (BID) | ORAL | 0 refills | Status: DC

## 2022-06-13 NOTE — Telephone Encounter (Signed)
LVM with info

## 2022-06-13 NOTE — Telephone Encounter (Signed)
Rx sent 

## 2022-06-13 NOTE — Telephone Encounter (Signed)
Pt called in to LM 1:30pm. Please do send in the RX to her pharmacy. Thanks.

## 2022-06-13 NOTE — Telephone Encounter (Signed)
That's fine, we can continue.

## 2022-06-13 NOTE — Telephone Encounter (Signed)
Debra Buchanan called this morning at 10:25 to ask if Debra Buchanan could take over prescribing Debra Buchanan.  The Dr. Parks Ranger has been prescribing this medication is retiring.  She takes Debra Buchanan '12mg'$  2/day and gets it through [facility].  Please let her know if you can do this.  Next appt 11/21

## 2022-06-13 NOTE — Telephone Encounter (Signed)
Please advise 

## 2022-07-04 DIAGNOSIS — M9902 Segmental and somatic dysfunction of thoracic region: Secondary | ICD-10-CM | POA: Diagnosis not present

## 2022-07-04 DIAGNOSIS — M5124 Other intervertebral disc displacement, thoracic region: Secondary | ICD-10-CM | POA: Diagnosis not present

## 2022-07-11 DIAGNOSIS — M9902 Segmental and somatic dysfunction of thoracic region: Secondary | ICD-10-CM | POA: Diagnosis not present

## 2022-07-11 DIAGNOSIS — M5124 Other intervertebral disc displacement, thoracic region: Secondary | ICD-10-CM | POA: Diagnosis not present

## 2022-07-18 DIAGNOSIS — M5124 Other intervertebral disc displacement, thoracic region: Secondary | ICD-10-CM | POA: Diagnosis not present

## 2022-07-18 DIAGNOSIS — M9902 Segmental and somatic dysfunction of thoracic region: Secondary | ICD-10-CM | POA: Diagnosis not present

## 2022-07-19 ENCOUNTER — Encounter: Payer: Self-pay | Admitting: Adult Health

## 2022-07-19 ENCOUNTER — Telehealth (INDEPENDENT_AMBULATORY_CARE_PROVIDER_SITE_OTHER): Payer: Medicare PPO | Admitting: Adult Health

## 2022-07-19 DIAGNOSIS — F411 Generalized anxiety disorder: Secondary | ICD-10-CM

## 2022-07-19 DIAGNOSIS — G4700 Insomnia, unspecified: Secondary | ICD-10-CM | POA: Diagnosis not present

## 2022-07-19 DIAGNOSIS — F331 Major depressive disorder, recurrent, moderate: Secondary | ICD-10-CM

## 2022-07-19 DIAGNOSIS — G2401 Drug induced subacute dyskinesia: Secondary | ICD-10-CM

## 2022-07-19 MED ORDER — QUETIAPINE FUMARATE 100 MG PO TABS: 100.0000 mg | ORAL_TABLET | Freq: Every day | ORAL | 3 refills | Status: DC

## 2022-07-19 MED ORDER — ESZOPICLONE 3 MG PO TABS: ORAL_TABLET | ORAL | 2 refills | Status: DC

## 2022-07-19 MED ORDER — AUSTEDO 12 MG PO TABS: 12.0000 mg | ORAL_TABLET | Freq: Two times a day (BID) | ORAL | 1 refills | Status: DC

## 2022-07-19 MED ORDER — LAMOTRIGINE 200 MG PO TABS: 200.0000 mg | ORAL_TABLET | Freq: Every day | ORAL | 3 refills | Status: DC

## 2022-07-19 NOTE — Progress Notes (Signed)
Debra Buchanan [id number] March 10, 1956 66 y.o.  Virtual Visit via Video Note  I connected with pt @ on 07/19/22 at  2:00 PM EST by a video enabled telemedicine application and verified that I am speaking with the correct person using two identifiers.   I discussed the limitations of evaluation and management by telemedicine and the availability of in person appointments. The patient expressed understanding and agreed to proceed.  I discussed the assessment and treatment plan with the patient. The patient was provided an opportunity to ask questions and all were answered. The patient agreed with the plan and demonstrated an understanding of the instructions.   The patient was advised to call back or seek an in-person evaluation if the symptoms worsen or if the condition fails to improve as anticipated.  I provided 25 minutes of non-face-to-face time during this encounter.  The patient was located at home.  The provider was located at [place].   Aloha Gell, NP   Subjective:   Patient ID:  Debra Buchanan is a 66 y.o. (DOB 29-Jan-1956) female.  Chief Complaint: No chief complaint on file.   HPI ANAISA RADI presents for follow-up of ADHD, anxiety, depression, and insomnia.  Describes mood today as "ok". Pleasant. Denies tearfulness. Mood symptoms - denies anxiety, depression, and irritability. Mood is consistent. Stating "I'm doing pretty good". Feels like medications continue to work well. Retired - living in [place]. Stable interest and motivation. Taking medications as prescribed.  Energy levels improved. Active, does not have a regular exercise routine.  Enjoys some usual interests and activities. Single. Lives alone. Has 2 daughters. Talking with family and friends.  Appetite adequate. Weight gain.  Sleeps better some nights than others. Averages 6 to 7 hours. Focus and concentration improved. Completing tasks. Managing aspects of household. 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 Denies SI or HI.  Denies AH or VH.   Review of Systems:  Review of Systems  Musculoskeletal:  Negative for gait problem.  Neurological:  Negative for tremors.  Psychiatric/Behavioral:         Please refer to HPI    Medications: I have reviewed the patient's current medications.  Current Outpatient Medications  Medication Sig Dispense Refill   QUEtiapine (SEROQUEL) 100 MG tablet Take 1 tablet (100 mg total) by mouth at bedtime. 90 tablet 3   aspirin EC 81 MG tablet Take 81 mg by mouth daily.     Deutetrabenazine (AUSTEDO) 12 MG TABS Take 12 mg by mouth 2 (two) times daily. 180 tablet 1   donepezil (ARICEPT) 10 MG tablet Take 1 tablet by mouth daily.     Eszopiclone 3 MG TABS TAKE 1 TABLET BY MOUTH IMMEDIATELY BEFORE AT BEDTIME 30 tablet 2   fexofenadine (ALLEGRA) 180 MG tablet Take 180 mg by mouth as needed.     fluticasone (FLONASE) 50 MCG/ACT nasal spray Place 1 spray into both nostrils in the morning.      lamoTRIgine (LAMICTAL) 200 MG tablet Take 1 tablet (200 mg total) by mouth at bedtime. 90 tablet 3   PEG-KCl-NaCl-NaSulf-Na Asc-C (PLENVU) 140 g SOLR Take 1 kit by mouth as directed. Manufacturer's coupon Universal coupon code:BIN: P2366821; GROUP: [id number]; PCN: [id number]; ID: [id number]; PAY NO MORE $50; NO prior authorization 1 each 0   QUEtiapine (SEROQUEL) 25 MG tablet TAKE 1 TABLET BY MOUTH AT BEDTIME 30 tablet 2   Valbenazine Tosylate (INGREZZA) 80 MG  CAPS Take 1 capsule by mouth at bedtime.     No current facility-administered medications for this visit.    Medication Side Effects: None  Allergies:  Allergies  Allergen Reactions   Grass Extracts  [Gramineae Pollens] Cough, Hives, Itching, Shortness Of Breath and Swelling   Iodine Swelling, Anaphylaxis and Rash   Shellfish Allergy Cough, Hives, Itching, Rash, Shortness Of Breath and Swelling   Shellfish-Derived Products Anaphylaxis   Other  Hives, Itching and Cough    Pine Nuts   Penicillins     Unsure of reaction Other reaction(s): Other (See Comments) Unsure of reaction    Past Medical History:  Diagnosis Date   ADHD    Allergy    Anemia    Anxiety    Bipolar disorder ([place])    Depression    Insomnia    Memory impairment     Family History  Problem Relation Age of Onset   Breast cancer Mother    Emphysema Father    Heart attack Paternal Grandfather    Colon cancer Paternal Uncle    Esophageal cancer Neg Hx    Stomach cancer Neg Hx     Social History   Socioeconomic History   Marital status: Divorced    Spouse name: Not on file   Number of children: 2   Years of education: Not on file   Highest education level: Not on file  Occupational History   Occupation: Fish farm manager: [employer]  Tobacco Use   Smoking status: Never   Smokeless tobacco: Never  Vaping Use   Vaping Use: Never used  Substance and Sexual Activity   Alcohol use: Yes    Alcohol/week: 2.0 standard drinks of alcohol    Types: 2 Standard drinks or equivalent per week    Comment: occasional, 1-2 per week   Drug use: No   Sexual activity: Not on file  Other Topics Concern   Not on file  Social History Narrative   Lives at home alone   Right-handed   Rare caffeine use   Social Determinants of Health   Financial Resource Strain: Not on file  Food Insecurity: Not on file  Transportation Needs: Not on file  Physical Activity: Not on file  Stress: Not on file  Social Connections: Not on file  Intimate Partner Violence: Not on file    Past Medical History, Surgical history, Social history, and Family history were reviewed and updated as appropriate.   Please see review of systems for further details on the patient's review from today.   Objective:   Physical Exam:  There were no vitals taken for this visit.  Physical Exam Constitutional:      General: She is not in acute  distress. Musculoskeletal:        General: No deformity.  Neurological:     Mental Status: She is alert and oriented to person, place, and time.     Coordination: Coordination normal.  Psychiatric:        Attention and Perception: Attention and perception normal. She does not perceive auditory or visual hallucinations.        Mood and Affect: Mood normal. Mood is not anxious or depressed. Affect is not labile, blunt, angry or inappropriate.        Speech: Speech normal.        Behavior: Behavior normal.        Thought Content: Thought content normal. Thought content is not paranoid or  delusional. Thought content does not include homicidal or suicidal ideation. Thought content does not include homicidal or suicidal plan.        Cognition and Memory: Cognition and memory normal.        Judgment: Judgment normal.     Comments: Insight intact     Lab Review:     Component Value Date/Time   NA 140 01/14/2020 1440   K 4.4 01/14/2020 1440   CL 104 01/14/2020 1440   CO2 30 01/14/2020 1440   GLUCOSE 89 01/14/2020 1440   BUN 16 01/14/2020 1440   CREATININE 0.89 01/14/2020 1440   CALCIUM 9.8 01/14/2020 1440   PROT 7.2 01/14/2020 1440   AST 14 01/14/2020 1440   ALT 19 01/14/2020 1440   BILITOT 0.6 01/14/2020 1440   GFRNONAA >60 09/29/2010 2228   GFRAA  09/29/2010 2228    >60        The eGFR has been calculated using the MDRD equation. This calculation has not been validated in all clinical situations. eGFR's persistently <60 mL/min signify possible Chronic Kidney Disease.       Component Value Date/Time   WBC 7.2 08/09/2020 1501   RBC 4.09 08/09/2020 1501   HGB 12.2 08/09/2020 1501   HCT 37.0 08/09/2020 1501   PLT 341 08/09/2020 1501   MCV 90.5 08/09/2020 1501   MCV 92.9 01/26/2013 1254   MCH 29.8 08/09/2020 1501   MCHC 33.0 08/09/2020 1501   RDW 12.7 08/09/2020 1501   LYMPHSABS 2.6 09/29/2010 2228   MONOABS 0.7 09/29/2010 2228   EOSABS 0.3 09/29/2010 2228   BASOSABS  0.0 09/29/2010 2228    No results found for: "POCLITH", "LITHIUM"   No results found for: "PHENYTOIN", "PHENOBARB", "VALPROATE", "CBMZ"   .res Assessment: Plan:    Plan:  Lunesta 80m at hs   Lamictal 2044mat hs  Seroquel 10048mt hs  Austedo 18m65mice daily - [place] yearly  Patient advised to contact office with any questions, adverse effects, or acute worsening in signs and symptoms  Counseled patient regarding potential benefits, risks, and side effects of Lamictal to include potential risk of Stevens-Johnson syndrome. Advised patient to stop taking Lamictal and contact office immediately if rash develops and to seek urgent medical attention if rash is severe and/or spreading quickly.  Discussed potential metabolic side effects associated with atypical antipsychotics, as well as potential risk for movement side effects. Advised pt to contact office if movement side effects occur.   Diagnoses and all orders for this visit:  Major depressive disorder, recurrent episode, moderate (HCC) -     lamoTRIgine (LAMICTAL) 200 MG tablet; Take 1 tablet (200 mg total) by mouth at bedtime.  Generalized anxiety disorder -     lamoTRIgine (LAMICTAL) 200 MG tablet; Take 1 tablet (200 mg total) by mouth at bedtime.  Insomnia, unspecified type -     Eszopiclone 3 MG TABS; TAKE 1 TABLET BY MOUTH IMMEDIATELY BEFORE AT BEDTIME -     QUEtiapine (SEROQUEL) 100 MG tablet; Take 1 tablet (100 mg total) by mouth at bedtime.  Tardive dyskinesia -     Deutetrabenazine (AUSTEDO) 12 MG TABS; Take 12 mg by mouth 2 (two) times daily.     Please see After Visit Summary for patient specific instructions.  No future appointments.   No orders of the defined types were placed in this encounter.     -------------------------------

## 2022-07-19 NOTE — Addendum Note (Signed)
Addended by: Aloha Gell on: 07/19/2022 03:33 PM   Modules accepted: Level of Service

## 2022-08-08 DIAGNOSIS — N952 Postmenopausal atrophic vaginitis: Secondary | ICD-10-CM | POA: Diagnosis not present

## 2022-08-08 DIAGNOSIS — Z01419 Encounter for gynecological examination (general) (routine) without abnormal findings: Secondary | ICD-10-CM | POA: Diagnosis not present

## 2022-08-08 DIAGNOSIS — Z1231 Encounter for screening mammogram for malignant neoplasm of breast: Secondary | ICD-10-CM | POA: Diagnosis not present

## 2022-12-01 ENCOUNTER — Other Ambulatory Visit: Payer: Self-pay

## 2022-12-01 DIAGNOSIS — G4700 Insomnia, unspecified: Secondary | ICD-10-CM

## 2022-12-01 MED ORDER — ESZOPICLONE 3 MG PO TABS: ORAL_TABLET | ORAL | 5 refills | Status: DC

## 2022-12-05 DIAGNOSIS — R202 Paresthesia of skin: Secondary | ICD-10-CM | POA: Diagnosis not present

## 2022-12-05 DIAGNOSIS — E782 Mixed hyperlipidemia: Secondary | ICD-10-CM | POA: Diagnosis not present

## 2022-12-05 DIAGNOSIS — F5104 Psychophysiologic insomnia: Secondary | ICD-10-CM | POA: Diagnosis not present

## 2022-12-05 DIAGNOSIS — R430 Anosmia: Secondary | ICD-10-CM | POA: Diagnosis not present

## 2022-12-05 DIAGNOSIS — R7301 Impaired fasting glucose: Secondary | ICD-10-CM | POA: Diagnosis not present

## 2022-12-05 DIAGNOSIS — Z139 Encounter for screening, unspecified: Secondary | ICD-10-CM | POA: Diagnosis not present

## 2022-12-05 DIAGNOSIS — R944 Abnormal results of kidney function studies: Secondary | ICD-10-CM | POA: Diagnosis not present

## 2022-12-05 DIAGNOSIS — E669 Obesity, unspecified: Secondary | ICD-10-CM | POA: Diagnosis not present

## 2022-12-12 DIAGNOSIS — Z0001 Encounter for general adult medical examination with abnormal findings: Secondary | ICD-10-CM | POA: Diagnosis not present

## 2022-12-12 DIAGNOSIS — D225 Melanocytic nevi of trunk: Secondary | ICD-10-CM | POA: Diagnosis not present

## 2022-12-12 DIAGNOSIS — R7303 Prediabetes: Secondary | ICD-10-CM | POA: Diagnosis not present

## 2022-12-12 DIAGNOSIS — Z0000 Encounter for general adult medical examination without abnormal findings: Secondary | ICD-10-CM | POA: Diagnosis not present

## 2022-12-12 DIAGNOSIS — Z1283 Encounter for screening for malignant neoplasm of skin: Secondary | ICD-10-CM | POA: Diagnosis not present

## 2022-12-12 DIAGNOSIS — F331 Major depressive disorder, recurrent, moderate: Secondary | ICD-10-CM | POA: Diagnosis not present

## 2022-12-12 DIAGNOSIS — L820 Inflamed seborrheic keratosis: Secondary | ICD-10-CM | POA: Diagnosis not present

## 2022-12-12 DIAGNOSIS — F319 Bipolar disorder, unspecified: Secondary | ICD-10-CM | POA: Diagnosis not present

## 2022-12-12 DIAGNOSIS — R7301 Impaired fasting glucose: Secondary | ICD-10-CM | POA: Diagnosis not present

## 2023-01-30 DIAGNOSIS — L570 Actinic keratosis: Secondary | ICD-10-CM | POA: Diagnosis not present

## 2023-01-30 DIAGNOSIS — L820 Inflamed seborrheic keratosis: Secondary | ICD-10-CM | POA: Diagnosis not present

## 2023-01-30 DIAGNOSIS — X32XXXD Exposure to sunlight, subsequent encounter: Secondary | ICD-10-CM | POA: Diagnosis not present

## 2023-02-13 DIAGNOSIS — S0011XA Contusion of right eyelid and periocular area, initial encounter: Secondary | ICD-10-CM | POA: Diagnosis not present

## 2023-02-13 DIAGNOSIS — S6992XA Unspecified injury of left wrist, hand and finger(s), initial encounter: Secondary | ICD-10-CM | POA: Diagnosis not present

## 2023-02-13 DIAGNOSIS — W19XXXA Unspecified fall, initial encounter: Secondary | ICD-10-CM | POA: Diagnosis not present

## 2023-02-13 DIAGNOSIS — Z1231 Encounter for screening mammogram for malignant neoplasm of breast: Secondary | ICD-10-CM | POA: Diagnosis not present

## 2023-02-13 DIAGNOSIS — S4991XA Unspecified injury of right shoulder and upper arm, initial encounter: Secondary | ICD-10-CM | POA: Diagnosis not present

## 2023-02-13 DIAGNOSIS — S7001XA Contusion of right hip, initial encounter: Secondary | ICD-10-CM | POA: Diagnosis not present

## 2023-02-13 DIAGNOSIS — M25532 Pain in left wrist: Secondary | ICD-10-CM | POA: Diagnosis not present

## 2023-02-15 DIAGNOSIS — R0789 Other chest pain: Secondary | ICD-10-CM | POA: Diagnosis not present

## 2023-02-15 DIAGNOSIS — W19XXXA Unspecified fall, initial encounter: Secondary | ICD-10-CM | POA: Diagnosis not present

## 2023-03-07 DIAGNOSIS — Z1330 Encounter for screening examination for mental health and behavioral disorders, unspecified: Secondary | ICD-10-CM | POA: Diagnosis not present

## 2023-03-07 DIAGNOSIS — Z1211 Encounter for screening for malignant neoplasm of colon: Secondary | ICD-10-CM | POA: Diagnosis not present

## 2023-03-09 DIAGNOSIS — R202 Paresthesia of skin: Secondary | ICD-10-CM | POA: Diagnosis not present

## 2023-03-09 DIAGNOSIS — F5104 Psychophysiologic insomnia: Secondary | ICD-10-CM | POA: Diagnosis not present

## 2023-03-09 DIAGNOSIS — F5105 Insomnia due to other mental disorder: Secondary | ICD-10-CM | POA: Diagnosis not present

## 2023-03-09 DIAGNOSIS — R430 Anosmia: Secondary | ICD-10-CM | POA: Diagnosis not present

## 2023-03-09 DIAGNOSIS — F319 Bipolar disorder, unspecified: Secondary | ICD-10-CM | POA: Diagnosis not present

## 2023-03-10 ENCOUNTER — Ambulatory Visit: Payer: Medicare PPO | Admitting: Adult Health

## 2023-03-10 ENCOUNTER — Encounter: Payer: Self-pay | Admitting: Adult Health

## 2023-03-10 DIAGNOSIS — F331 Major depressive disorder, recurrent, moderate: Secondary | ICD-10-CM

## 2023-03-10 DIAGNOSIS — G2401 Drug induced subacute dyskinesia: Secondary | ICD-10-CM | POA: Diagnosis not present

## 2023-03-10 DIAGNOSIS — F902 Attention-deficit hyperactivity disorder, combined type: Secondary | ICD-10-CM | POA: Diagnosis not present

## 2023-03-10 DIAGNOSIS — G4700 Insomnia, unspecified: Secondary | ICD-10-CM | POA: Diagnosis not present

## 2023-03-10 DIAGNOSIS — F411 Generalized anxiety disorder: Secondary | ICD-10-CM

## 2023-03-10 NOTE — Progress Notes (Signed)
CANYA UMBEL [id number] 1955-10-26 67 y.o.  Subjective:   Patient ID:  Debra Buchanan is a 67 y.o. (DOB 1956-03-18) female.  Chief Complaint: No chief complaint on file.   HPI Debra Buchanan presents to the office today for follow-up of ADHD, anxiety, depression, and insomnia.  Describes mood today as "ok". Pleasant. Denies tearfulness. Mood symptoms - denies anxiety, depression, and irritability. Denies panic attacks. Denies worry, rumination, and over thinking. Mood is consistent. Stating "I'm doing good". Feels like medications continue to work well. Retired - living in [place]. Stable interest and motivation. Taking medications as prescribed.  Energy levels improved. Active, does not have a regular exercise routine.  Enjoys some usual interests and activities. Single. Lives alone. Has 2 daughters. Talking with family and friends.  Appetite adequate. Weight gain.  Sleeps well most nights. Averages 6 to 7 hours. Focus and concentration improved. Completing tasks. Managing aspects of household. 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 Denies SI or HI.  Denies AH or VH. Denies self harm. Denies substance use.  Flowsheet Row ED from 12/08/2020 in [facility] RISK CATEGORY No Risk       Review of Systems:  Review of Systems  Musculoskeletal:  Negative for gait problem.  Neurological:  Negative for tremors.  Psychiatric/Behavioral:         Please refer to HPI    Medications: I have reviewed the patient's current medications.  Current Outpatient Medications  Medication Sig Dispense Refill   aspirin EC 81 MG tablet Take 81 mg by mouth daily.     Deutetrabenazine (AUSTEDO) 12 MG TABS Take 12 mg by mouth 2 (two) times daily. 180 tablet 1   donepezil (ARICEPT) 10 MG tablet Take 1 tablet by mouth daily.     Eszopiclone 3 MG TABS TAKE 1 TABLET BY MOUTH IMMEDIATELY BEFORE AT BEDTIME 30 tablet 5    fexofenadine (ALLEGRA) 180 MG tablet Take 180 mg by mouth as needed.     fluticasone (FLONASE) 50 MCG/ACT nasal spray Place 1 spray into both nostrils in the morning.      lamoTRIgine (LAMICTAL) 200 MG tablet Take 1 tablet (200 mg total) by mouth at bedtime. 90 tablet 3   PEG-KCl-NaCl-NaSulf-Na Asc-C (PLENVU) 140 g SOLR Take 1 kit by mouth as directed. Manufacturer's coupon Universal coupon code:BIN: [id number]; GROUP: [id number]; PCN: [id number]; ID: [id number]; PAY NO MORE $50; NO prior authorization 1 each 0   QUEtiapine (SEROQUEL) 100 MG tablet Take 1 tablet (100 mg total) by mouth at bedtime. 90 tablet 3   QUEtiapine (SEROQUEL) 25 MG tablet TAKE 1 TABLET BY MOUTH AT BEDTIME 30 tablet 2   No current facility-administered medications for this visit.    Medication Side Effects: None  Allergies:  Allergies  Allergen Reactions   Grass Extracts  [Gramineae Pollens] Cough, Hives, Itching, Shortness Of Breath and Swelling   Iodine Swelling, Anaphylaxis and Rash   Shellfish Allergy Cough, Hives, Itching, Rash, Shortness Of Breath and Swelling   Shellfish-Derived Products Anaphylaxis   Other Hives, Itching and Cough    Pine Nuts   Penicillins     Unsure of reaction Other reaction(s): Other (See Comments) Unsure of reaction    Past Medical History:  Diagnosis Date   ADHD    Allergy    Anemia    Anxiety    Bipolar disorder (HCC)    Depression    Insomnia  Memory impairment     Past Medical History, Surgical history, Social history, and Family history were reviewed and updated as appropriate.   Please see review of systems for further details on the patient's review from today.   Objective:   Physical Exam:  There were no vitals taken for this visit.  Physical Exam Constitutional:      General: She is not in acute distress. Musculoskeletal:        General: No deformity.  Neurological:     Mental Status: She is alert and oriented to person, place, and time.     Coordination:  Coordination normal.  Psychiatric:        Attention and Perception: Attention and perception normal. She does not perceive auditory or visual hallucinations.        Mood and Affect: Affect is not labile, blunt, angry or inappropriate.        Speech: Speech normal.        Behavior: Behavior normal.        Thought Content: Thought content normal. Thought content is not paranoid or delusional. Thought content does not include homicidal or suicidal ideation. Thought content does not include homicidal or suicidal plan.        Cognition and Memory: Cognition and memory normal.        Judgment: Judgment normal.     Comments: Insight intact     Lab Review:     Component Value Date/Time   NA 140 01/14/2020 1440   K 4.4 01/14/2020 1440   CL 104 01/14/2020 1440   CO2 30 01/14/2020 1440   GLUCOSE 89 01/14/2020 1440   BUN 16 01/14/2020 1440   CREATININE 0.89 01/14/2020 1440   CALCIUM 9.8 01/14/2020 1440   PROT 7.2 01/14/2020 1440   AST 14 01/14/2020 1440   ALT 19 01/14/2020 1440   BILITOT 0.6 01/14/2020 1440   GFRNONAA >60 09/29/2010 2228   GFRAA  09/29/2010 2228    >60        The eGFR has been calculated using the MDRD equation. This calculation has not been validated in all clinical situations. eGFR's persistently <60 mL/min signify possible Chronic Kidney Disease.       Component Value Date/Time   WBC 7.2 08/09/2020 1501   RBC 4.09 08/09/2020 1501   HGB 12.2 08/09/2020 1501   HCT 37.0 08/09/2020 1501   PLT 341 08/09/2020 1501   MCV 90.5 08/09/2020 1501   MCV 92.9 01/26/2013 1254   MCH 29.8 08/09/2020 1501   MCHC 33.0 08/09/2020 1501   RDW 12.7 08/09/2020 1501   LYMPHSABS 2.6 09/29/2010 2228   MONOABS 0.7 09/29/2010 2228   EOSABS 0.3 09/29/2010 2228   BASOSABS 0.0 09/29/2010 2228    No results found for: "POCLITH", "LITHIUM"   No results found for: "PHENYTOIN", "PHENOBARB", "VALPROATE", "CBMZ"   .res Assessment: Plan:    Plan:  Lunesta 3mg  at hs   Lamictal  200mg  at hs   D/C Seroquel 100mg  at hs  Austedo 12mg  twice daily - [place]  RTC yearly  Patient advised to contact office with any questions, adverse effects, or acute worsening in signs and symptoms  Counseled patient regarding potential benefits, risks, and side effects of Lamictal to include potential risk of Stevens-Johnson syndrome. Advised patient to stop taking Lamictal and contact office immediately if rash develops and to seek urgent medical attention if rash is severe and/or spreading quickly.  Discussed potential metabolic side effects associated with atypical antipsychotics, as well as potential  risk for movement side effects. Advised pt to contact office if movement side effects occur.   There are no diagnoses linked to this encounter.   Please see After Visit Summary for patient specific instructions.  Future Appointments  Date Time Provider Department Center  07/21/2023  3:00 PM Leovanni Bjorkman, Thereasa Solo, NP [facility] None    No orders of the defined types were placed in this encounter.   -------------------------------

## 2023-03-14 ENCOUNTER — Telehealth: Payer: Self-pay | Admitting: Adult Health

## 2023-03-14 NOTE — Telephone Encounter (Signed)
Debra Buchanan called at 4:27 to request refill of her Aricept.  She was just here and thought a prescription had been sent in.  She has been out about 3 days now. Appt 11/22.  Send to [facility], [place] - [name]

## 2023-03-14 NOTE — Telephone Encounter (Signed)
Debra Buchanan did not mention this in her last visit. Has been filled previously by [facility].

## 2023-03-15 MED ORDER — DONEPEZIL HCL 10 MG PO TABS: 10.0000 mg | ORAL_TABLET | Freq: Every day | ORAL | 0 refills | Status: DC

## 2023-03-15 NOTE — Telephone Encounter (Signed)
Sent!

## 2023-03-15 NOTE — Telephone Encounter (Signed)
Patient called for a RF on Aricept. It is on her med list, but not mentioned in your note. She said her neurology practice has gone out of business. Is it okay to Rx?

## 2023-04-04 DIAGNOSIS — Z888 Allergy status to other drugs, medicaments and biological substances status: Secondary | ICD-10-CM | POA: Diagnosis not present

## 2023-04-04 DIAGNOSIS — Z91013 Allergy to seafood: Secondary | ICD-10-CM | POA: Diagnosis not present

## 2023-04-04 DIAGNOSIS — Z8673 Personal history of transient ischemic attack (TIA), and cerebral infarction without residual deficits: Secondary | ICD-10-CM | POA: Diagnosis not present

## 2023-04-04 DIAGNOSIS — Z7984 Long term (current) use of oral hypoglycemic drugs: Secondary | ICD-10-CM | POA: Diagnosis not present

## 2023-04-04 DIAGNOSIS — Z1211 Encounter for screening for malignant neoplasm of colon: Secondary | ICD-10-CM | POA: Diagnosis not present

## 2023-04-04 DIAGNOSIS — Z880 Allergy status to penicillin: Secondary | ICD-10-CM | POA: Diagnosis not present

## 2023-04-04 DIAGNOSIS — Z86010 Personal history of colonic polyps: Secondary | ICD-10-CM | POA: Diagnosis not present

## 2023-04-04 DIAGNOSIS — Z79899 Other long term (current) drug therapy: Secondary | ICD-10-CM | POA: Diagnosis not present

## 2023-04-04 DIAGNOSIS — K5790 Diverticulosis of intestine, part unspecified, without perforation or abscess without bleeding: Secondary | ICD-10-CM | POA: Diagnosis not present

## 2023-04-04 DIAGNOSIS — K5730 Diverticulosis of large intestine without perforation or abscess without bleeding: Secondary | ICD-10-CM | POA: Diagnosis not present

## 2023-04-04 DIAGNOSIS — Z7982 Long term (current) use of aspirin: Secondary | ICD-10-CM | POA: Diagnosis not present

## 2023-04-26 DIAGNOSIS — Z8673 Personal history of transient ischemic attack (TIA), and cerebral infarction without residual deficits: Secondary | ICD-10-CM | POA: Diagnosis not present

## 2023-04-26 DIAGNOSIS — E669 Obesity, unspecified: Secondary | ICD-10-CM | POA: Diagnosis not present

## 2023-04-26 DIAGNOSIS — Z6832 Body mass index (BMI) 32.0-32.9, adult: Secondary | ICD-10-CM | POA: Diagnosis not present

## 2023-05-05 ENCOUNTER — Encounter: Payer: Self-pay | Admitting: Adult Health

## 2023-05-05 ENCOUNTER — Ambulatory Visit (INDEPENDENT_AMBULATORY_CARE_PROVIDER_SITE_OTHER): Payer: Medicare PPO | Admitting: Adult Health

## 2023-05-05 DIAGNOSIS — F419 Anxiety disorder, unspecified: Secondary | ICD-10-CM

## 2023-05-05 DIAGNOSIS — F909 Attention-deficit hyperactivity disorder, unspecified type: Secondary | ICD-10-CM | POA: Diagnosis not present

## 2023-05-05 DIAGNOSIS — F32A Depression, unspecified: Secondary | ICD-10-CM

## 2023-05-05 DIAGNOSIS — F411 Generalized anxiety disorder: Secondary | ICD-10-CM

## 2023-05-05 DIAGNOSIS — G4700 Insomnia, unspecified: Secondary | ICD-10-CM | POA: Diagnosis not present

## 2023-05-05 DIAGNOSIS — G2401 Drug induced subacute dyskinesia: Secondary | ICD-10-CM

## 2023-05-05 DIAGNOSIS — F331 Major depressive disorder, recurrent, moderate: Secondary | ICD-10-CM

## 2023-05-05 NOTE — Progress Notes (Signed)
Debra Buchanan [id number] 1956-05-09 67 y.o.  Virtual Visit via Telephone Note  I connected with pt on 05/05/23 at 10:20 AM EDT by telephone and verified that I am speaking with the correct person using two identifiers.   I discussed the limitations, risks, security and privacy concerns of performing an evaluation and management service by telephone and the availability of in person appointments. I also discussed with the patient that there may be a patient responsible charge related to this service. The patient expressed understanding and agreed to proceed.   I discussed the assessment and treatment plan with the patient. The patient was provided an opportunity to ask questions and all were answered. The patient agreed with the plan and demonstrated an understanding of the instructions.   The patient was advised to call back or seek an in-person evaluation if the symptoms worsen or if the condition fails to improve as anticipated.  I provided 25 minutes of non-face-to-face time during this encounter.  The patient was located at home.  The provider was located at [facility].   Debra Gibbs, NP   Subjective:   Patient ID:  Debra Buchanan is a 66 y.o. (DOB 03/01/1956) female.  Chief Complaint: No chief complaint on file.   HPI Debra Buchanan presents for follow-up of ADHD, anxiety, depression, and insomnia.  Describes mood today as "ok". Pleasant. Denies tearfulness. Mood symptoms - denies anxiety, depression, and irritability. Denies panic attacks. Denies worry, rumination, and over thinking. Mood is consistent. Stating "I'm doing alright". Feels like medications continue to work well. Stable interest and motivation. Taking medications as prescribed.  Energy levels improved. Active, does not have a regular exercise routine.  Enjoys some usual interests and activities. Single. Lives alone. Has 2 daughters. Talking with family and friends.  Appetite adequate. Weight  gain.  Sleeps well most nights. Averages 6 to 7 hours. Focus and concentration improved. Completing tasks. Managing aspects of household. Retired. Working part-time at [employer]. 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 Denies SI or HI.  Denies AH or VH. Denies self harm. Denies substance use.  Review of Systems:  Review of Systems  Musculoskeletal:  Negative for gait problem.  Neurological:  Negative for tremors.  Psychiatric/Behavioral:         Please refer to HPI    Medications: I have reviewed the patient's current medications.  Current Outpatient Medications  Medication Sig Dispense Refill   aspirin EC 81 MG tablet Take 81 mg by mouth daily.     Deutetrabenazine (AUSTEDO) 12 MG TABS Take 12 mg by mouth 2 (two) times daily. 180 tablet 1   donepezil (ARICEPT) 10 MG tablet Take 1 tablet (10 mg total) by mouth daily. 90 tablet 0   Eszopiclone 3 MG TABS TAKE 1 TABLET BY MOUTH IMMEDIATELY BEFORE AT BEDTIME 30 tablet 5   fexofenadine (ALLEGRA) 180 MG tablet Take 180 mg by mouth as needed.     fluticasone (FLONASE) 50 MCG/ACT nasal spray Place 1 spray into both nostrils in the morning.      lamoTRIgine (LAMICTAL) 200 MG tablet Take 1 tablet (200 mg total) by mouth at bedtime. 90 tablet 3   PEG-KCl-NaCl-NaSulf-Na Asc-C (PLENVU) 140 g SOLR Take 1 kit by mouth as directed. Manufacturer's coupon Universal coupon code:BIN: G6837245; GROUP: [id number]; PCN: [id number]; ID: [id number]; PAY NO MORE $50; NO prior authorization 1 each 0   QUEtiapine (SEROQUEL) 100 MG tablet Take 1 tablet (100 mg total) by mouth at bedtime. 90 tablet 3  QUEtiapine (SEROQUEL) 25 MG tablet TAKE 1 TABLET BY MOUTH AT BEDTIME 30 tablet 2   No current facility-administered medications for this visit.    Medication Side Effects: None  Allergies:  Allergies  Allergen Reactions   Grass Extracts  [Gramineae Pollens] Cough, Hives, Itching, Shortness Of Breath and Swelling   Iodine Swelling, Anaphylaxis and Rash   Shellfish Allergy  Cough, Hives, Itching, Rash, Shortness Of Breath and Swelling   Shellfish-Derived Products Anaphylaxis   Other Hives, Itching and Cough    Pine Nuts   Penicillins     Unsure of reaction Other reaction(s): Other (See Comments) Unsure of reaction    Past Medical History:  Diagnosis Date   ADHD    Allergy    Anemia    Anxiety    Bipolar disorder (HCC)    Depression    Insomnia    Memory impairment     Family History  Problem Relation Age of Onset   Breast cancer Mother    Emphysema Father    Heart attack Paternal Grandfather    Colon cancer Paternal Uncle    Esophageal cancer Neg Hx    Stomach cancer Neg Hx     Social History   Socioeconomic History   Marital status: Divorced    Spouse name: Not on file   Number of children: 2   Years of education: Not on file   Highest education level: Not on file  Occupational History   Occupation: Garment/textile technologist: [employer]  Tobacco Use   Smoking status: Never   Smokeless tobacco: Never  Vaping Use   Vaping status: Never Used  Substance and Sexual Activity   Alcohol use: Yes    Alcohol/week: 2.0 standard drinks of alcohol    Types: 2 Standard drinks or equivalent per week    Comment: occasional, 1-2 per week   Drug use: No   Sexual activity: Not on file  Other Topics Concern   Not on file  Social History Narrative   Lives at home alone   Right-handed   Rare caffeine use   Social Determinants of Health   Financial Resource Strain: Low Risk  (03/04/2023)   Received from [facility]   Overall Financial Resource Strain (CARDIA)    Difficulty of Paying Living Expenses: Not hard at all  Food Insecurity: No Food Insecurity (03/04/2023)   [facility]   Hunger Vital Sign    Worried About Running Out of Food in the Last Year: Never true    Ran Out of Food in the Last Year: Never true  Transportation Needs: No Transportation Needs (03/04/2023)   Received from [facility] - Transportation    Lack of Transportation (Medical): No    Lack of Transportation (Non-Medical): No  Physical Activity: Insufficiently Active (03/04/2023)   Received from [facility]   Exercise Vital Sign    Days of Exercise per Week: 2 days    Minutes of Exercise per Session: 40 min  Stress: No Stress Concern Present (03/04/2023)   Received from [facility] - Occupational Stress Questionnaire    Feeling of Stress : Not at all  Social Connections: Socially Integrated (03/04/2023)   Received from [facility]   Social Network    How would you rate your social network (family, work, friends)?: Good participation with social networks  Intimate Partner Violence: Not At Risk (04/04/2023)   Received  [facility]   HITS    Over the last 12 months how often did your partner physically hurt you?: 1    Over the last 12 months how often did your partner insult you or talk down to you?: 1    Over the last 12 months how often did your partner threaten you with physical harm?: 1    Over the last 12 months how often did your partner scream or curse at you?: 1    Past Medical History, Surgical history, Social history, and Family history were reviewed and updated as appropriate.   Please see review of systems for further details on the patient's review from today.   Objective:   Physical Exam:  There were no vitals taken for this visit.  Physical Exam Constitutional:      General: She is not in acute distress. Musculoskeletal:        General: No deformity.  Neurological:     Mental Status: She is alert and oriented to person, place, and time.     Coordination: Coordination normal.  Psychiatric:        Attention and Perception: Attention and perception normal. She does not perceive auditory or visual hallucinations.        Mood and Affect: Affect is not labile, blunt, angry or inappropriate.        Speech: Speech normal.         Behavior: Behavior normal.        Thought Content: Thought content normal. Thought content is not paranoid or delusional. Thought content does not include homicidal or suicidal ideation. Thought content does not include homicidal or suicidal plan.        Cognition and Memory: Cognition and memory normal.        Judgment: Judgment normal.     Comments: Insight intact     Lab Review:     Component Value Date/Time   NA 140 01/14/2020 1440   K 4.4 01/14/2020 1440   CL 104 01/14/2020 1440   CO2 30 01/14/2020 1440   GLUCOSE 89 01/14/2020 1440   BUN 16 01/14/2020 1440   CREATININE 0.89 01/14/2020 1440   CALCIUM 9.8 01/14/2020 1440   PROT 7.2 01/14/2020 1440   AST 14 01/14/2020 1440   ALT 19 01/14/2020 1440   BILITOT 0.6 01/14/2020 1440   GFRNONAA >60 09/29/2010 2228   GFRAA  09/29/2010 2228    >60        The eGFR has been calculated using the MDRD equation. This calculation has not been validated in all clinical situations. eGFR's persistently <60 mL/min signify possible Chronic Kidney Disease.       Component Value Date/Time   WBC 7.2 08/09/2020 1501   RBC 4.09 08/09/2020 1501   HGB 12.2 08/09/2020 1501   HCT 37.0 08/09/2020 1501   PLT 341 08/09/2020 1501   MCV 90.5 08/09/2020 1501   MCV 92.9 01/26/2013 1254   MCH 29.8 08/09/2020 1501   MCHC 33.0 08/09/2020 1501   RDW 12.7 08/09/2020 1501   LYMPHSABS 2.6 09/29/2010 2228   MONOABS 0.7 09/29/2010 2228   EOSABS 0.3 09/29/2010 2228   BASOSABS 0.0 09/29/2010 2228    No results found for: "POCLITH", "LITHIUM"   No results found for: "PHENYTOIN", "PHENOBARB", "VALPROATE", "CBMZ"   .res Assessment: Plan:    Plan:  Lunesta 3mg  at hs   Lamictal 200mg  at hs   Austedo 12mg  twice daily - [place]  RTC 6 months  Patient advised to  contact office with any questions, adverse effects, or acute worsening in signs and symptoms  Counseled patient regarding potential benefits, risks, and side effects of Lamictal to include  potential risk of Stevens-Johnson syndrome. Advised patient to stop taking Lamictal and contact office immediately if rash develops and to seek urgent medical attention if rash is severe and/or spreading quickly.  Discussed potential metabolic side effects associated with atypical antipsychotics, as well as potential risk for movement side effects. Advised pt to contact office if movement side effects occur.   There are no diagnoses linked to this encounter.  Please see After Visit Summary for patient specific instructions.  Future Appointments  Date Time Provider Department Center  05/05/2023 10:20 AM Karmen Altamirano, Thereasa Solo, NP [facility] None  07/21/2023  3:00 PM Makenzie Weisner, Thereasa Solo, NP [facility] None    No orders of the defined types were placed in this encounter.     -------------------------------

## 2023-05-15 ENCOUNTER — Other Ambulatory Visit: Payer: Self-pay | Admitting: Adult Health

## 2023-05-15 DIAGNOSIS — G2401 Drug induced subacute dyskinesia: Secondary | ICD-10-CM

## 2023-05-28 ENCOUNTER — Other Ambulatory Visit: Payer: Self-pay

## 2023-05-28 DIAGNOSIS — G4700 Insomnia, unspecified: Secondary | ICD-10-CM

## 2023-05-29 MED ORDER — ESZOPICLONE 3 MG PO TABS: ORAL_TABLET | ORAL | 2 refills | Status: DC

## 2023-06-14 DIAGNOSIS — Z0289 Encounter for other administrative examinations: Secondary | ICD-10-CM

## 2023-06-15 ENCOUNTER — Encounter: Payer: Self-pay | Admitting: Family Medicine

## 2023-06-15 ENCOUNTER — Ambulatory Visit (INDEPENDENT_AMBULATORY_CARE_PROVIDER_SITE_OTHER): Payer: Medicare PPO | Admitting: Family Medicine

## 2023-06-15 VITALS — BP 135/81 | HR 59 | Temp 97.9°F | Ht 64.5 in | Wt 188.0 lb

## 2023-06-15 DIAGNOSIS — F319 Bipolar disorder, unspecified: Secondary | ICD-10-CM

## 2023-06-15 DIAGNOSIS — E65 Localized adiposity: Secondary | ICD-10-CM

## 2023-06-15 DIAGNOSIS — Z6831 Body mass index (BMI) 31.0-31.9, adult: Secondary | ICD-10-CM

## 2023-06-15 DIAGNOSIS — E66811 Obesity, class 1: Secondary | ICD-10-CM | POA: Diagnosis not present

## 2023-06-15 DIAGNOSIS — E6609 Other obesity due to excess calories: Secondary | ICD-10-CM

## 2023-06-15 NOTE — Assessment & Plan Note (Signed)
Reviewed bioimpedance results together.  Her visceral fat rating is 12 with a target goal less than 10.  She has noticed abdominal weight gain post menopause.  We discussed this as a risk factor for metabolic disease including heart disease, fatty liver and type 2 diabetes.  Begin active plan for weight reduction.  Aim for a visceral fat rating under 10

## 2023-06-15 NOTE — Assessment & Plan Note (Signed)
She is managed by psychiatry currently on Lamictal 200 mg at bedtime.  Stress levels have improved.  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  Will be working on reducing stress levels, keeping junk food snacks out of the house and working on mental health.

## 2023-06-15 NOTE — Progress Notes (Signed)
Office: [phone]  /  Fax: [phone]   Initial Visit  Debra Buchanan was seen in clinic today to evaluate for obesity. She is interested in losing weight to improve overall health and reduce the risk of weight related complications. She presents today to review program treatment options, initial physical assessment, and evaluation.     She was referred by: PCP  When asked what else they would like to accomplish? She states: Improve energy levels and physical activity, Improve quality of life, Improve appearance, and Improve self-confidence  Weight history: she has [place] 20 lb [place]  She was a Clinical biochemist.  She was less active.  She was eating out more, socializing.  She is working PT now  When asked how has your weight affected you? She states: Having fatigue and Having poor endurance  Some associated conditions: None  Contributing factors: Family history, Reduced physical activity, and Life event : divorce, moving  Weight promoting medications identified: None  Current nutrition plan: None  Current level of physical activity: Other: shagging  Current or previous pharmacotherapy: Metformin  Response to medication: Ineffective so it was discontinued   Past medical history includes:   Past Medical History:  Diagnosis Date   ADHD    Allergy    Anemia    Anxiety    Bipolar disorder (HCC)    Depression    Insomnia    Memory impairment      Objective:   BP 135/81   Pulse (!) 59   Temp 97.9 F (36.6 C)   Ht 5' 4.5" (1.638 m)   Wt 188 lb (85.3 kg)   SpO2 98%   BMI 31.77 kg/m  She was weighed on the bioimpedance scale: Body mass index is 31.77 kg/m.  Peak Weight:188 , Body Fat%:41.9, Visceral Fat Rating:12, Weight trend over the last 12 months: Unchanged  General:  Alert, oriented and cooperative. Patient is in no acute distress.  Respiratory: Normal respiratory effort, no problems with respiration noted   Gait: able  to ambulate independently  Mental Status: Normal mood and affect. Normal behavior. Normal judgment and thought content.   DIAGNOSTIC DATA REVIEWED:  BMET    Component Value Date/Time   NA 140 01/14/2020 1440   K 4.4 01/14/2020 1440   CL 104 01/14/2020 1440   CO2 30 01/14/2020 1440   GLUCOSE 89 01/14/2020 1440   BUN 16 01/14/2020 1440   CREATININE 0.89 01/14/2020 1440   CALCIUM 9.8 01/14/2020 1440   GFRNONAA >60 09/29/2010 2228   GFRAA  09/29/2010 2228    >60        The eGFR has been calculated using the MDRD equation. This calculation has not been validated in all clinical situations. eGFR's persistently <60 mL/min signify possible Chronic Kidney Disease.   No results found for: "HGBA1C" No results found for: "INSULIN" CBC    Component Value Date/Time   WBC 7.2 08/09/2020 1501   RBC 4.09 08/09/2020 1501   HGB 12.2 08/09/2020 1501   HCT 37.0 08/09/2020 1501   PLT 341 08/09/2020 1501   MCV 90.5 08/09/2020 1501   MCV 92.9 01/26/2013 1254   MCH 29.8 08/09/2020 1501   MCHC 33.0 08/09/2020 1501   RDW 12.7 08/09/2020 1501   Iron/TIBC/Ferritin/ %Sat No results found for: "IRON", "TIBC", "FERRITIN", "IRONPCTSAT" Lipid Panel  No results found for: "CHOL", "TRIG", "HDL", "CHOLHDL", "VLDL", "LDLCALC", "LDLDIRECT" Hepatic Function Panel     Component Value Date/Time   PROT  7.2 01/14/2020 1440   AST 14 01/14/2020 1440   ALT 19 01/14/2020 1440   BILITOT 0.6 01/14/2020 1440      Component Value Date/Time   TSH 2.08 01/14/2020 1440     Assessment and Plan:   Central adiposity Assessment & Plan: Reviewed bioimpedance results together.  Her visceral fat rating is 12 with a target goal less than 10.  She has noticed abdominal weight gain post menopause.  We discussed this as a risk factor for metabolic disease including heart disease, fatty liver and type 2 diabetes.  Begin active plan for weight reduction.  Aim for a visceral fat rating under 10   Class 1 obesity  due to excess calories with body mass index (BMI) of 31.0 to 31.9 in adult, unspecified whether serious comorbidity present  Bipolar affective disorder, remission status unspecified (HCC) Assessment & Plan: She is managed by psychiatry currently on Lamictal 200 mg at bedtime.  Stress levels have improved.  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  Will be working on reducing stress levels, keeping junk food snacks out of the house and working on mental health.         Obesity Treatment / Action Plan:  Patient will work on garnering support from family and friends to begin weight loss journey. Will work on eliminating or reducing the presence of highly palatable, calorie dense foods in the home. Will complete provided nutritional and psychosocial assessment questionnaire before the next appointment. Will be scheduled for indirect calorimetry to determine resting energy expenditure in a fasting state.  This will allow [place] to create a reduced calorie, high-protein meal plan to promote loss of fat mass while preserving muscle mass. Will think about ideas on how to incorporate physical activity into their daily routine. Counseled on the health benefits of losing 5%-15% of total body weight. Was counseled on nutritional approaches to weight loss and benefits of reducing processed foods and consuming plant-based foods and high quality protein as part of nutritional weight management. Was counseled on pharmacotherapy and role as an adjunct in weight management.   Obesity Education Performed Today:  She was weighed on the bioimpedance scale and results were discussed and documented in the synopsis.  We discussed obesity as a disease and the importance of a more detailed evaluation of all the factors contributing to the disease.  We discussed the importance of long term lifestyle changes which include nutrition, exercise and behavioral  modifications as well as the importance of customizing this to her specific health and social needs.  We discussed the benefits of reaching a healthier weight to alleviate the symptoms of existing conditions and reduce the risks of the biomechanical, metabolic and psychological effects of obesity.  Humphrey Rolls appears to be in the action stage of change and states they are ready to start intensive lifestyle modifications and behavioral modifications.  30 minutes was spent today on this visit including the above counseling, pre-visit chart review, and post-visit documentation.  Reviewed by clinician on day of visit: allergies, medications, problem list, medical history, surgical history, family history, social history, and previous encounter notes pertinent to obesity diagnosis.    Seymour Bars, D.O. DABFM, DABOM [facility] [phone] [place], [place] [address] [phone]

## 2023-06-26 ENCOUNTER — Ambulatory Visit (INDEPENDENT_AMBULATORY_CARE_PROVIDER_SITE_OTHER): Payer: Medicare PPO | Admitting: Bariatrics

## 2023-06-26 ENCOUNTER — Encounter: Payer: Self-pay | Admitting: Bariatrics

## 2023-06-26 VITALS — BP 147/81 | HR 61 | Ht 64.5 in | Wt 184.0 lb

## 2023-06-26 DIAGNOSIS — Z1331 Encounter for screening for depression: Secondary | ICD-10-CM | POA: Diagnosis not present

## 2023-06-26 DIAGNOSIS — E559 Vitamin D deficiency, unspecified: Secondary | ICD-10-CM | POA: Diagnosis not present

## 2023-06-26 DIAGNOSIS — Z0000 Encounter for general adult medical examination without abnormal findings: Secondary | ICD-10-CM | POA: Diagnosis not present

## 2023-06-26 DIAGNOSIS — R0602 Shortness of breath: Secondary | ICD-10-CM | POA: Diagnosis not present

## 2023-06-26 DIAGNOSIS — R030 Elevated blood-pressure reading, without diagnosis of hypertension: Secondary | ICD-10-CM | POA: Insufficient documentation

## 2023-06-26 DIAGNOSIS — R5383 Other fatigue: Secondary | ICD-10-CM | POA: Insufficient documentation

## 2023-06-26 DIAGNOSIS — E66811 Obesity, class 1: Secondary | ICD-10-CM | POA: Diagnosis not present

## 2023-06-26 DIAGNOSIS — Z6831 Body mass index (BMI) 31.0-31.9, adult: Secondary | ICD-10-CM | POA: Diagnosis not present

## 2023-06-26 DIAGNOSIS — R7309 Other abnormal glucose: Secondary | ICD-10-CM | POA: Insufficient documentation

## 2023-06-26 DIAGNOSIS — E538 Deficiency of other specified B group vitamins: Secondary | ICD-10-CM | POA: Insufficient documentation

## 2023-06-26 DIAGNOSIS — F319 Bipolar disorder, unspecified: Secondary | ICD-10-CM | POA: Diagnosis not present

## 2023-06-26 DIAGNOSIS — D508 Other iron deficiency anemias: Secondary | ICD-10-CM

## 2023-06-26 DIAGNOSIS — D649 Anemia, unspecified: Secondary | ICD-10-CM | POA: Insufficient documentation

## 2023-06-27 LAB — COMPREHENSIVE METABOLIC PANEL
ALT: 20 [IU]/L (ref 0–32)
AST: 21 [IU]/L (ref 0–40)
Albumin: 4.3 g/dL (ref 3.9–4.9)
Alkaline Phosphatase: 116 [IU]/L (ref 44–121)
BUN/Creatinine Ratio: 12 (ref 12–28)
BUN: 12 mg/dL (ref 8–27)
Bilirubin Total: 0.5 mg/dL (ref 0.0–1.2)
CO2: 22 mmol/L (ref 20–29)
Calcium: 9.3 mg/dL (ref 8.7–10.3)
Chloride: 102 mmol/L (ref 96–106)
Creatinine, Ser: 0.97 mg/dL (ref 0.57–1.00)
Globulin, Total: 2.9 g/dL (ref 1.5–4.5)
Glucose: 101 mg/dL — ABNORMAL HIGH (ref 70–99)
Potassium: 4.2 mmol/L (ref 3.5–5.2)
Sodium: 140 mmol/L (ref 134–144)
Total Protein: 7.2 g/dL (ref 6.0–8.5)
eGFR: 64 mL/min/{1.73_m2} (ref >59)

## 2023-06-27 LAB — CBC WITH DIFFERENTIAL/PLATELET
Basophils Absolute: 0 10*3/uL (ref 0.0–0.2)
Basos: 1 %
EOS (ABSOLUTE): 0.2 10*3/uL (ref 0.0–0.4)
Eos: 3 %
Hematocrit: 32.3 % — ABNORMAL LOW (ref 34.0–46.6)
Hemoglobin: 10.5 g/dL — ABNORMAL LOW (ref 11.1–15.9)
Immature Grans (Abs): 0 10*3/uL (ref 0.0–0.1)
Immature Granulocytes: 0 %
Lymphocytes Absolute: 1.8 10*3/uL (ref 0.7–3.1)
Lymphs: 27 %
MCH: 29 pg (ref 26.6–33.0)
MCHC: 32.5 g/dL (ref 31.5–35.7)
MCV: 89 fL (ref 79–97)
Monocytes Absolute: 0.5 10*3/uL (ref 0.1–0.9)
Monocytes: 8 %
Neutrophils Absolute: 4 10*3/uL (ref 1.4–7.0)
Neutrophils: 61 %
Platelets: 393 10*3/uL (ref 150–450)
RBC: 3.62 x10E6/uL — ABNORMAL LOW (ref 3.77–5.28)
RDW: 12.3 % (ref 11.7–15.4)
WBC: 6.6 10*3/uL (ref 3.4–10.8)

## 2023-06-27 LAB — TSH+T4F+T3FREE
Free T4: 1.06 ng/dL (ref 0.82–1.77)
T3, Free: 3 pg/mL (ref 2.0–4.4)
TSH: 3.44 u[IU]/mL (ref 0.450–4.500)

## 2023-06-27 LAB — LIPID PANEL WITH LDL/HDL RATIO
Cholesterol, Total: 188 mg/dL (ref 100–199)
HDL: 78 mg/dL (ref >39)
LDL Chol Calc (NIH): 98 mg/dL (ref 0–99)
LDL/HDL Ratio: 1.3 ratio (ref 0.0–3.2)
Triglycerides: 64 mg/dL (ref 0–149)
VLDL Cholesterol Cal: 12 mg/dL (ref 5–40)

## 2023-06-27 LAB — INSULIN, RANDOM: INSULIN: 7.4 u[IU]/mL (ref 2.6–24.9)

## 2023-06-27 LAB — FERRITIN: Ferritin: 8 ng/mL — ABNORMAL LOW (ref 15–150)

## 2023-06-27 LAB — HEMOGLOBIN A1C
Est. average glucose Bld gHb Est-mCnc: 123 mg/dL
Hgb A1c MFr Bld: 5.9 % — ABNORMAL HIGH (ref 4.8–5.6)

## 2023-06-27 LAB — VITAMIN B12: Vitamin B-12: 627 pg/mL (ref 232–1245)

## 2023-06-27 LAB — VITAMIN D 25 HYDROXY (VIT D DEFICIENCY, FRACTURES): Vit D, 25-Hydroxy: 26.2 ng/mL — ABNORMAL LOW (ref 30.0–100.0)

## 2023-06-27 NOTE — Progress Notes (Signed)
Chief Complaint:   OBESITY Debra Buchanan (MR# [id number]) is a 67 y.o. female who presents for evaluation and treatment of obesity and related comorbidities. Current BMI is Body mass index is 31.1 kg/m. Debra Buchanan has been struggling with her weight for many years and has been unsuccessful in either losing weight, maintaining weight loss, or reaching her healthy weight goal.  Debra Buchanan is currently in the action stage of change and ready to dedicate time achieving and maintaining a healthier weight. Debra Buchanan is interested in becoming our patient and working on intensive lifestyle modifications including (but not limited to) diet and exercise for weight loss.  Patient saw Dr. Cathey Endow for her information session on 06/15/2023.  Debra Buchanan's habits were reviewed today and are as follows: her desired weight loss is 29 lbs, she started gaining excessive weight at age 41, her heaviest weight ever was 188 pounds, she has significant food cravings issues, she snacks frequently in the evenings, she is frequently drinking liquids with calories, she frequently makes poor food choices, and she struggles with emotional eating.  Depression Screen Debra Buchanan's Food and Mood (modified PHQ-9) score was 18.  Subjective:   1. Other fatigue Tyree denies daytime somnolence and admits to waking up still tired. Patient has a history of symptoms of morning fatigue. Debra Buchanan generally gets 6 or 7 hours of sleep per night, and states that she has nightime awakenings. Snoring is present. Apneic episodes are not present. Epworth Sleepiness Score is 0.   2. SOB (shortness of breath) on exertion Debra Buchanan notes increasing shortness of breath with exercising and seems to be worsening over time with weight gain. She notes getting out of breath sooner with activity than she used to. This has not gotten worse recently. Debra Buchanan denies shortness of breath at rest or orthopnea.  3. Vitamin B 12 deficiency Patient is currently taking B12  supplementation.  4. Vitamin D deficiency Patient is taking magnesium and B12.  She has a history of vitamin D deficiency.  5. Elevated glucose Patient denies a family history.  6. Bipolar affective disorder, remission status unspecified (HCC) Patient is taking Lamictal.   7. Other iron deficiency anemia Patient has given blood, and she has low iron.   8. Elevated blood pressure reading Patient is not on medications.   9. Health care maintenance Given obesity.   Assessment/Plan:   1. Other fatigue Debra Buchanan does feel that her weight is causing her energy to be lower than it should be. Fatigue may be related to obesity, depression or many other causes. Labs will be ordered, and in the meanwhile, Debra Buchanan will focus on self care including making healthy food choices, increasing physical activity and focusing on stress reduction.  - EKG 12-Lead  2. SOB (shortness of breath) on exertion Debra Buchanan does feel that she gets out of breath more easily that she used to when she exercises. Debra Buchanan's shortness of breath appears to be obesity related and exercise induced. She has agreed to work on weight loss and gradually increase exercise to treat her exercise induced shortness of breath. Will continue to monitor closely.  3. Vitamin B 12 deficiency We will check labs today, and we will follow-up at patient's next visit.   - Vitamin B12  4. Vitamin D deficiency We will check labs today, and we will follow-up at patient's next visit.   - VITAMIN D 25 Hydroxy (Vit-D Deficiency, Fractures)  5. Elevated glucose We will check labs today, and we will follow-up at patient's next visit.   -  Hemoglobin A1c - Insulin, random  6. Bipolar affective disorder, remission status unspecified (HCC) Patient will continue her medications, and she will follow-up with her psychiatrist.   7. Other iron deficiency anemia We will check labs today, and we will follow-up at patient's next visit.   - CBC with  Differential/Platelet - Ferritin  8. Elevated blood pressure reading We will continue to monitor over time.   9. Health care maintenance We will check labs today. EKG and IC were done today and reviewed with the patient.   - Hemoglobin A1c - Insulin, random - Lipid Panel With LDL/HDL Ratio - Vitamin B12 - VITAMIN D 25 Hydroxy (Vit-D Deficiency, Fractures) - Comprehensive metabolic panel - CBC with Differential/Platelet - Ferritin - TSH+T4F+T3Free  10. Depression screening Debra Buchanan had a positive depression screening. Depression is commonly associated with obesity and often results in emotional eating behaviors. We will monitor this closely and work on CBT to help improve the non-hunger eating patterns. Referral to Psychology may be required if no improvement is seen as she continues in our clinic.  11. Class 1 obesity due to excess calories with serious comorbidity and body mass index (BMI) of 31.0 to 31.9 in adult Debra Buchanan is currently in the action stage of change and her goal is to continue with weight loss efforts. I recommend Debra Buchanan begin the structured treatment plan as follows:  She has agreed to the Category 2 Plan.  Meal planning and intentional eating were discussed.  Patient is to increase her protein and increase her water intake to at least 64 ounces.  Exercise goals: No exercise has been prescribed at this time.   Behavioral modification strategies: increasing lean protein intake, decreasing simple carbohydrates, increasing vegetables, increasing water intake, decreasing eating out, no skipping meals, meal planning and cooking strategies, keeping healthy foods in the home, and planning for success.  She was informed of the importance of frequent follow-up visits to maximize her success with intensive lifestyle modifications for her multiple health conditions. She was informed we would discuss her lab results at her next visit unless there is a critical issue that needs to be  addressed sooner. Debra Buchanan agreed to keep her next visit at the agreed upon time to discuss these results.  Objective:   Blood pressure (!) 147/81, pulse 61, height 5' 4.5" (1.638 m), weight 184 lb (83.5 kg), SpO2 98%. Body mass index is 31.1 kg/m.  EKG: Normal sinus rhythm, rate 64 BPM.  Indirect Calorimeter completed today shows a VO2 of 241 and a REE of 1656.  Her calculated basal metabolic rate is 1610 thus her basal metabolic rate is better than expected.  General: Cooperative, alert, well developed, in no acute distress. HEENT: Conjunctivae and lids unremarkable. Cardiovascular: Regular rhythm.  Lungs: Normal work of breathing. Neurologic: No focal deficits.   Lab Results  Component Value Date   CREATININE 0.97 06/26/2023   BUN 12 06/26/2023   NA 140 06/26/2023   K 4.2 06/26/2023   CL 102 06/26/2023   CO2 22 06/26/2023   Lab Results  Component Value Date   ALT 20 06/26/2023   AST 21 06/26/2023   ALKPHOS 116 06/26/2023   BILITOT 0.5 06/26/2023   Lab Results  Component Value Date   HGBA1C 5.9 (H) 06/26/2023   Lab Results  Component Value Date   INSULIN 7.4 06/26/2023   Lab Results  Component Value Date   TSH 3.440 06/26/2023   Lab Results  Component Value Date   CHOL 188 06/26/2023  HDL 78 06/26/2023   LDLCALC 98 06/26/2023   TRIG 64 06/26/2023   Lab Results  Component Value Date   WBC 6.6 06/26/2023   HGB 10.5 (L) 06/26/2023   HCT 32.3 (L) 06/26/2023   MCV 89 06/26/2023   PLT 393 06/26/2023   Lab Results  Component Value Date   FERRITIN 8 (L) 06/26/2023   Attestation Statements:   Reviewed by clinician on day of visit: allergies, medications, problem list, medical history, surgical history, family history, social history, and previous encounter notes.  Time spent on visit including pre-visit chart review and post-visit charting and care was 40 minutes.   Obesity Behavioral Intervention: Approximately 15 minutes were spent on the discussion  below. ASK: We discussed the diagnosis of obesity with patient today and they agreed to give [place] permission to discuss obesity behavioral modification therapy. ASSESS: Patient has the diagnosis of obesity and BMI today is 31. Patient is in the action stage of change. ADVISE: The patient was educated on the multiple health risks of obesity as well as the benefit of weight loss to improve health. Patient was advised of the need for long term treatment and the importance of lifestyle modifications to improve current health and to decrease risk of future health problems. AGREE: Multiple dietary modification options and treatment options were discussed, and the patient agreed to follow the recommendations documented in the above note. ARRANGE: The patient was educated on the importance of frequent visits to treat obesity as outlined per OMA, CMS, and USPSTF guidelines and agreed to schedule the next follow up appointment today   I, Burt Knack, am acting as transcriptionist for [employer], DO.  I have reviewed the above documentation for accuracy and completeness, and I agree with the above. Corinna Capra, DO

## 2023-06-28 ENCOUNTER — Telehealth: Payer: Self-pay

## 2023-06-28 NOTE — Telephone Encounter (Signed)
Left message for patient to return call regarding lab results.  

## 2023-06-28 NOTE — Telephone Encounter (Signed)
-----   Message from Corinna Capra sent at 06/27/2023  9:57 AM EDT ----- Call pt. Please resume your iron if not taking. If no iron prescription, I can call in a prescription. If taking iron, then she may need a referral to Hematology for evaluation and possible iron infusion. ----- Message ----- From: Interface, [facility] Lab Results In Sent: 06/27/2023   5:38 AM EDT To: [place] Nickel, DO

## 2023-07-03 ENCOUNTER — Other Ambulatory Visit: Payer: Self-pay | Admitting: Adult Health

## 2023-07-03 ENCOUNTER — Telehealth (INDEPENDENT_AMBULATORY_CARE_PROVIDER_SITE_OTHER): Payer: Self-pay | Admitting: Bariatrics

## 2023-07-03 MED ORDER — DONEPEZIL HCL 10 MG PO TABS: 10.0000 mg | ORAL_TABLET | Freq: Every day | ORAL | 0 refills | Status: DC

## 2023-07-03 NOTE — Telephone Encounter (Signed)
Rx sent 

## 2023-07-03 NOTE — Telephone Encounter (Signed)
Pt states that her Iron RX was never sent to her pharmacy. Pt would like to start the iron supplement asap.

## 2023-07-03 NOTE — Telephone Encounter (Signed)
Debra Buchanan is requesting a refill on her Donzipil. States she has been out for weeks. Pharmacy is:  [facility], [place] - [name]   Phone: [phone]  Fax: [phone]

## 2023-07-04 ENCOUNTER — Other Ambulatory Visit: Payer: Self-pay | Admitting: Bariatrics

## 2023-07-04 MED ORDER — POLYSACCHARIDE IRON COMPLEX 150 MG PO CAPS: 150.0000 mg | ORAL_CAPSULE | Freq: Every day | ORAL | 0 refills | Status: DC

## 2023-07-04 NOTE — Telephone Encounter (Signed)
Can you please advise? Please send to [facility] pharmacy

## 2023-07-04 NOTE — Telephone Encounter (Signed)
Pt states that her Iron RX was never sent to her pharmacy. Pt would like to start the iron supplement asap.

## 2023-07-04 NOTE — Telephone Encounter (Signed)
Notified patient that Dr. Manson Passey sent prescription to pharmacy. Patient verbalized understanding.

## 2023-07-05 ENCOUNTER — Other Ambulatory Visit: Payer: Self-pay | Admitting: Adult Health

## 2023-07-05 DIAGNOSIS — G2401 Drug induced subacute dyskinesia: Secondary | ICD-10-CM

## 2023-07-06 ENCOUNTER — Ambulatory Visit: Payer: Medicare PPO | Admitting: Family Medicine

## 2023-07-13 ENCOUNTER — Ambulatory Visit: Payer: Medicare PPO | Admitting: Family Medicine

## 2023-07-13 ENCOUNTER — Encounter: Payer: Self-pay | Admitting: Family Medicine

## 2023-07-13 VITALS — BP 144/76 | HR 64 | Temp 98.1°F | Ht 64.5 in | Wt 183.0 lb

## 2023-07-13 DIAGNOSIS — F509 Eating disorder, unspecified: Secondary | ICD-10-CM | POA: Insufficient documentation

## 2023-07-13 DIAGNOSIS — F5089 Other specified eating disorder: Secondary | ICD-10-CM | POA: Diagnosis not present

## 2023-07-13 DIAGNOSIS — Z6830 Body mass index (BMI) 30.0-30.9, adult: Secondary | ICD-10-CM

## 2023-07-13 DIAGNOSIS — I10 Essential (primary) hypertension: Secondary | ICD-10-CM

## 2023-07-13 DIAGNOSIS — R7303 Prediabetes: Secondary | ICD-10-CM | POA: Diagnosis not present

## 2023-07-13 DIAGNOSIS — E66811 Obesity, class 1: Secondary | ICD-10-CM | POA: Diagnosis not present

## 2023-07-13 DIAGNOSIS — E559 Vitamin D deficiency, unspecified: Secondary | ICD-10-CM | POA: Diagnosis not present

## 2023-07-13 DIAGNOSIS — D508 Other iron deficiency anemias: Secondary | ICD-10-CM

## 2023-07-13 DIAGNOSIS — D509 Iron deficiency anemia, unspecified: Secondary | ICD-10-CM | POA: Insufficient documentation

## 2023-07-13 MED ORDER — HYDROCHLOROTHIAZIDE 12.5 MG PO CAPS: 12.5000 mg | ORAL_CAPSULE | Freq: Every day | ORAL | 0 refills | Status: DC

## 2023-07-13 MED ORDER — BUPROPION HCL ER (XL) 150 MG PO TB24: 150.0000 mg | ORAL_TABLET | Freq: Every day | ORAL | 0 refills | Status: DC

## 2023-07-13 NOTE — Assessment & Plan Note (Signed)
Lab Results  Component Value Date   HGBA1C 5.9 (H) 06/26/2023   New.  Reviewed diagnosis with patient.  Denies + fam hx of T2DM or a personal hx of GDM.   She has started to reduce her intake of sugar and starches She has room to ramp up exercise  Continue active plan for weight loss.  Consider adding metformin if needed

## 2023-07-13 NOTE — Progress Notes (Signed)
Office: [phone]  /  Fax: [phone]  WEIGHT SUMMARY AND BIOMETRICS  Starting Date: 06/23/23  Starting Weight: 184lb   Weight Lost Since Last Visit: 1lb   Vitals Temp: 98.1 F (36.7 C) BP: (!) 144/76 Pulse Rate: 64 SpO2: 99 %   Body Composition  Body Fat %: 40.8 % Fat Mass (lbs): 75 lbs Muscle Mass (lbs): 103.2 lbs Total Body Water (lbs): 74 lbs Visceral Fat Rating : 12   HPI  Chief Complaint: OBESITY  Debra Buchanan is here to discuss her progress with her obesity treatment plan. She is on the the Category 2 Plan and states she is following her eating plan approximately 75 % of the time. She states she is exercising 0 minutes 0 times per week.  Interval History:  Since last office visit she is down 1 lb She had company last week and was eating out more She has been more mindful of protein intake She gets food cravings after dinner She is keeping junk food out of her house She is drinking water and a diet sundrop, some ETOH when going out She is not doing any exercise without motivation  Pharmacotherapy: none  PHYSICAL EXAM:  Blood pressure (!) 144/76, pulse 64, temperature 98.1 F (36.7 C), height 5' 4.5" (1.638 m), weight 183 lb (83 kg), SpO2 99%. Body mass index is 30.93 kg/m.  General: She is healthy appearing,  cooperative, alert, well developed, and in no acute distress. PSYCH: Has normal mood, affect and thought process.   Lungs: Normal breathing effort, no conversational dyspnea.   ASSESSMENT AND PLAN  TREATMENT PLAN FOR OBESITY:  Recommended Dietary Goals  Burnis is currently in the action stage of change. As such, her goal is to continue weight management plan. She has agreed to the Category 2 Plan. - reviewed additional food options together on AVS  Behavioral Intervention  We discussed the following Behavioral Modification Strategies today: increasing lean protein intake to established goals, increasing lower glycemic fruits, increasing  fiber rich foods, increasing water intake , work on meal planning and preparation, keeping healthy foods at home, identifying sources and decreasing liquid calories, avoiding temptations and identifying enticing environmental cues, planning for success, and continue to work on maintaining a reduced calorie state, getting the recommended amount of protein, incorporating whole foods, making healthy choices, staying well hydrated and practicing mindfulness when eating..  Additional resources provided today: NA  Recommended Physical Activity Goals  [name] has been advised to work up to 150 minutes of moderate intensity aerobic activity a week and strengthening exercises 2-3 times per week for cardiovascular health, weight loss maintenance and preservation of muscle mass.   She has agreed to Think about enjoyable ways to increase daily physical activity and overcoming barriers to exercise and Increase physical activity in their day and reduce sedentary time (increase NEAT). - reviewed home exercise options, group exercise options  Pharmacotherapy changes for the treatment of obesity: begin Wellbutrin XL 150 mg qAM for emotional eating  ASSOCIATED CONDITIONS ADDRESSED TODAY  Primary hypertension Assessment & Plan: SBP has been >140 x 2 visits now.  She is not monitoring home BP readings Denies CP or HA She has never been on BP medication and denies a + fam hx of HTN  Continue to work on a heart healthy diet with weight reduction Begin hydrochlorothiazide 12.5 mg qAM  Orders: -     hydroCHLOROthiazide; Take 1 capsule (12.5 mg total) by mouth daily.  Dispense: 30 capsule; Refill: 0  Other disorder of  eating Assessment & Plan: She has disordered eating with excess hunger at night.  She is treated for mood disorder and sleeps well on current medications.  She has a good support system and has done much better with eating on a schedule, meal planning and intake of lean protein and fiber with  meals.  Begin Wellbutrin XL 150 mg qAM to aid in emotional eating Keep junk food out of the house Continue to work on stress reduction and mindfulness  Orders: -     buPROPion HCl ER (XL); Take 1 tablet (150 mg total) by mouth daily.  Dispense: 30 tablet; Refill: 0  Class 1 obesity due to excess calories with serious comorbidity and body mass index (BMI) of 30.0 to 30.9 in adult  Other iron deficiency anemia  Vitamin D deficiency Assessment & Plan: Last vitamin D Lab Results  Component Value Date   VD25OH 26.2 (L) 06/26/2023   Reviewed lab results with patient Explained that vitamin D deficiency can contribute to fatigue, bone loss and poor immune function Currently not on a vitamin D supplement  Begin OTC vitamin D 4,000 international units  daily   Prediabetes Assessment & Plan: Lab Results  Component Value Date   HGBA1C 5.9 (H) 06/26/2023   New.  Reviewed diagnosis with patient.  Denies + fam hx of T2DM or a personal hx of GDM.   She has started to reduce her intake of sugar and starches She has room to ramp up exercise  Continue active plan for weight loss.  Consider adding metformin if needed   Iron deficiency anemia, unspecified iron deficiency anemia type Assessment & Plan: Lab Results  Component Value Date   FERRITIN 8 (L) 06/26/2023   She has IDA, chronic in nature with a low ferritin and a Hgb of 10.5.  c/o fatigue but denies DOE.  She has started Nu-Iron 150 mg capsule once daily and is tolerating this well.  She reports being UTD with CRC screening and is postmenopausal.  Continue Nu-Iron 150 mg capsule with 4 oz of Trop 50 OJ in the morning to aid absorption Recheck levels in 3 mos       She was informed of the importance of frequent follow up visits to maximize her success with intensive lifestyle modifications for her multiple health conditions.   ATTESTASTION STATEMENTS:  Reviewed by clinician on day of visit: allergies, medications, problem  list, medical history, surgical history, family history, social history, and previous encounter notes pertinent to obesity diagnosis.   I have personally spent 30 minutes total time today in preparation, patient care, nutritional counseling and documentation for this visit, including the following: review of clinical lab tests; review of medical tests/procedures/services.      Glennis Brink, DO DABFM, DABOM [facility] [address] [place], [place] [address] [phone]

## 2023-07-13 NOTE — Assessment & Plan Note (Signed)
SBP has been >140 x 2 visits now.  She is not monitoring home BP readings Denies CP or HA She has never been on BP medication and denies a + fam hx of HTN  Continue to work on a heart healthy diet with weight reduction Begin hydrochlorothiazide 12.5 mg qAM

## 2023-07-13 NOTE — Patient Instructions (Addendum)
Begin hydrochlorothiazide 12.5 mg each morning for blood pressure  Take RX iron taking it with 4 oz Trop50  Orange juice  Start OTC vitamin D 4,000 international units  daily  Allow 2 fresh or frozen fruit servings (any)- Preferably before 3 pm  Allow 1/4 plate starch with dinner - sweet potato - 1/2 large or 1 smaller - baked potato, - 1/2 large or 1 smaller - 1/2 cup cooked rice - keto bun - low carb tortilla - Joseph's Lavish Bread flatbread - Barilla Protein Pasta  Another on the go breakfast option: PROTEIN SHAKE OR PROTEIN BAR OR GREEK YOGURT + ONE FRESH FRUIT SERVING  Begin Wellbutrin XL 150 mg each morning for emotional eating

## 2023-07-13 NOTE — Assessment & Plan Note (Signed)
She has disordered eating with excess hunger at night.  She is treated for mood disorder and sleeps well on current medications.  She has a good support system and has done much better with eating on a schedule, meal planning and intake of lean protein and fiber with meals.  Begin Wellbutrin XL 150 mg qAM to aid in emotional eating Keep junk food out of the house Continue to work on stress reduction and mindfulness

## 2023-07-13 NOTE — Assessment & Plan Note (Signed)
Last vitamin D Lab Results  Component Value Date   VD25OH 26.2 (L) 06/26/2023   Reviewed lab results with patient Explained that vitamin D deficiency can contribute to fatigue, bone loss and poor immune function Currently not on a vitamin D supplement  Begin OTC vitamin D 4,000 international units  daily

## 2023-07-13 NOTE — Assessment & Plan Note (Signed)
Lab Results  Component Value Date   FERRITIN 8 (L) 06/26/2023   She has IDA, chronic in nature with a low ferritin and a Hgb of 10.5.  c/o fatigue but denies DOE.  She has started Nu-Iron 150 mg capsule once daily and is tolerating this well.  She reports being UTD with CRC screening and is postmenopausal.  Continue Nu-Iron 150 mg capsule with 4 oz of Trop 50 OJ in the morning to aid absorption Recheck levels in 3 mos

## 2023-07-20 ENCOUNTER — Ambulatory Visit: Payer: Medicare PPO | Admitting: Family Medicine

## 2023-07-21 ENCOUNTER — Ambulatory Visit: Payer: Medicare PPO | Admitting: Adult Health

## 2023-08-03 ENCOUNTER — Telehealth: Payer: Self-pay | Admitting: Adult Health

## 2023-08-03 NOTE — Telephone Encounter (Signed)
Patient asking for RF of Seroquel. Per note 9/6 both 100 and 25 doses were discontinued.  She said she needs the Seroquel in addition to the [facility] or she won't sleep. Said she has been taking it all this time and still has a couple of tablets left.

## 2023-08-03 NOTE — Telephone Encounter (Signed)
[facility] - [place], [place] - [name]  called at 4:30 to request refill of Debra Buchanan's seroquel.  Said they sent a fax.  She has appt 12/22/23.

## 2023-08-04 MED ORDER — QUETIAPINE FUMARATE 100 MG PO TABS: 100.0000 mg | ORAL_TABLET | Freq: Every day | ORAL | 0 refills | Status: DC

## 2023-08-04 NOTE — Telephone Encounter (Signed)
 Rx for 100 mg sent.

## 2023-08-07 ENCOUNTER — Encounter: Payer: Self-pay | Admitting: Family Medicine

## 2023-08-07 ENCOUNTER — Ambulatory Visit: Payer: Medicare PPO | Admitting: Family Medicine

## 2023-08-07 VITALS — BP 135/79 | HR 63 | Temp 97.7°F | Ht 64.5 in | Wt 182.0 lb

## 2023-08-07 DIAGNOSIS — L820 Inflamed seborrheic keratosis: Secondary | ICD-10-CM | POA: Diagnosis not present

## 2023-08-07 DIAGNOSIS — L02224 Furuncle of groin: Secondary | ICD-10-CM | POA: Diagnosis not present

## 2023-08-07 DIAGNOSIS — Z6830 Body mass index (BMI) 30.0-30.9, adult: Secondary | ICD-10-CM | POA: Diagnosis not present

## 2023-08-07 DIAGNOSIS — E66811 Obesity, class 1: Secondary | ICD-10-CM

## 2023-08-07 DIAGNOSIS — D509 Iron deficiency anemia, unspecified: Secondary | ICD-10-CM

## 2023-08-07 DIAGNOSIS — I10 Essential (primary) hypertension: Secondary | ICD-10-CM

## 2023-08-07 DIAGNOSIS — B9689 Other specified bacterial agents as the cause of diseases classified elsewhere: Secondary | ICD-10-CM | POA: Diagnosis not present

## 2023-08-07 DIAGNOSIS — L821 Other seborrheic keratosis: Secondary | ICD-10-CM | POA: Diagnosis not present

## 2023-08-07 DIAGNOSIS — E6609 Other obesity due to excess calories: Secondary | ICD-10-CM

## 2023-08-07 MED ORDER — LOMAIRA 8 MG PO TABS: ORAL_TABLET | ORAL | 0 refills | Status: DC

## 2023-08-07 MED ORDER — HYDROCHLOROTHIAZIDE 12.5 MG PO CAPS: 12.5000 mg | ORAL_CAPSULE | Freq: Every day | ORAL | 0 refills | Status: DC

## 2023-08-07 MED ORDER — POLYSACCHARIDE IRON COMPLEX 150 MG PO CAPS: 150.0000 mg | ORAL_CAPSULE | Freq: Every day | ORAL | 0 refills | Status: DC

## 2023-08-07 NOTE — Progress Notes (Signed)
Office: [phone]  /  Fax: [phone]  WEIGHT SUMMARY AND BIOMETRICS  Starting Date: 06/20/23  Starting Weight: 184lb   Weight Lost Since Last Visit: 1lb   Vitals Temp: 97.7 F (36.5 C) BP: 135/79 Pulse Rate: 63 SpO2: 97 %   Body Composition  Body Fat %: 39.9 % Fat Mass (lbs): 73 lbs Muscle Mass (lbs): 104.2 lbs Total Body Water (lbs): 73.4 lbs Visceral Fat Rating : 11   HPI  Chief Complaint: OBESITY  Debra Buchanan is here to discuss her progress with her obesity treatment plan. She is on the the Category 2 Plan and states she is following her eating plan approximately 50-60 % of the time. She states she is moving more doing things around the house.   Interval History:  Since last office visit she is down 1 lb She started Wellbutrin XL 150 in the mornings with slightly less emotional eating She is up 1 pound of muscle mass and down 2 pounds of body fat since her last visit She has had an increase in headache frequency. She is doing well with the addition of HCTZ 12.5 mg once daily for hypertension She has not yet added in regular exercise, feeling poor motivation She has been better with mindful eating when eating out She has not been logging her calories  Pharmacotherapy: Wellbutrin XL 150 mg once daily  PHYSICAL EXAM:  Blood pressure 135/79, pulse 63, temperature 97.7 F (36.5 C), height 5' 4.5" (1.638 m), weight 182 lb (82.6 kg), SpO2 97%. Body mass index is 30.76 kg/m.  General: She is healthy appearing, cooperative, alert, well developed, and in no acute distress. PSYCH: Has normal mood, affect and thought process.   Lungs: Normal breathing effort, no conversational dyspnea.   ASSESSMENT AND PLAN  TREATMENT PLAN FOR OBESITY:  Recommended Dietary Goals  [name] is currently in the action stage of change. As such, her goal is to continue weight management plan. She has agreed to keeping a food journal and adhering to recommended goals of 1300 calories  and 85 to 110 g of protein.  Behavioral Intervention  We discussed the following Behavioral Modification Strategies today: increasing lean protein intake to established goals, work on tracking and journaling calories using tracking application, keeping healthy foods at home, work on managing stress, creating time for self-care and relaxation, continue to work on implementation of reduced calorie nutritional plan, planning for success, and continue to work on maintaining a reduced calorie state, getting the recommended amount of protein, incorporating whole foods, making healthy choices, staying well hydrated and practicing mindfulness when eating..  Additional resources provided today: NA  Recommended Physical Activity Goals  Isatou has been advised to work up to 150 minutes of moderate intensity aerobic activity a week and strengthening exercises 2-3 times per week for cardiovascular health, weight loss maintenance and preservation of muscle mass.   She has agreed to [employer] strengthening exercises with a goal of 2-3 sessions a week  We discussed the importance of regular exercise rather that is cardio or resistance training exercise or cardiovascular health and weight reduction especially with her BMI of 30 and her target weight of 155 pounds.  Pharmacotherapy changes for the treatment of obesity: Discontinue Wellbutrin.  Begin Lomaira 8 mg tab twice daily.  PDMP reviewed.  Blood pressure heart rate are within normal limits.  Patient denies a history of A-fib or cardiac arrhythmia.  Informed consent signed.  Will use in combination with a reduced calorie diet, dietary logging and regular exercise.  ASSOCIATED CONDITIONS ADDRESSED TODAY  Iron deficiency anemia, unspecified iron deficiency anemia type -     Polysaccharide Iron Complex; Take 1 capsule (150 mg total) by mouth daily.  Dispense: 30 capsule; Refill: 0  Primary hypertension Assessment & Plan: Blood pressure has improved with the  addition of HCTZ 12.5 mg capsule daily.  She is tolerating this well without adverse side effect.  She does plan to establish care with a new PCP closer to home, number was provided today.  Her blood pressure care may be moved over to her primary care provider.  Continue HCTZ 12.5 mg once daily.  Continue active plan for weight reduction.  Orders: -     hydroCHLOROthiazide; Take 1 capsule (12.5 mg total) by mouth daily.  Dispense: 30 capsule; Refill: 0  Class 1 obesity due to excess calories with body mass index (BMI) of 30.0 to 30.9 in adult, unspecified whether serious comorbidity present -     Lomaira; 1 tab po 30 min before breakfast and 1 tab po 2 pm  Dispense: 60 tablet; Refill: 0      She was informed of the importance of frequent follow up visits to maximize her success with intensive lifestyle modifications for her multiple health conditions.   ATTESTASTION STATEMENTS:  Reviewed by clinician on day of visit: allergies, medications, problem list, medical history, surgical history, family history, social history, and previous encounter notes pertinent to obesity diagnosis.   I have personally spent 30 minutes total time today in preparation, patient care, nutritional counseling and documentation for this visit, including the following: review of clinical lab tests; review of medical tests/procedures/services.      Glennis Brink, DO DABFM, DABOM [facility] [address] [place], [place] [address] [phone]

## 2023-08-07 NOTE — Patient Instructions (Addendum)
[employer] ZERO sugar  [employer]life or [employer] shakes   Try logging daily intake on the [employer] with a goal of 1300 cal/ day This should include 85- 110 g or protein per day Limit foods/ drinks with over 8 g of sugar per serving Hydrate well with water  Opt for lactose- free dairy options  Stop Wellbutrin   Start Lomaira 8 mg -- take 1 tab 30 min before breakfast and 1 tab at 2 pm  Aim for walking/ workouts 3 days/ wk

## 2023-08-07 NOTE — Assessment & Plan Note (Signed)
Blood pressure has improved with the addition of HCTZ 12.5 mg capsule daily.  She is tolerating this well without adverse side effect.  She does plan to establish care with a new PCP closer to home, number was provided today.  Her blood pressure care may be moved over to her primary care provider.  Continue HCTZ 12.5 mg once daily.  Continue active plan for weight reduction.

## 2023-08-07 NOTE — Assessment & Plan Note (Signed)
Lab Results  Component Value Date   FERRITIN 8 (L) 06/26/2023   She has been taking new iron 150 mg capsule daily.  She is tolerating this fairly well this has complaints of fatigue.  She reports being up-to-date with colorectal cancer screening but has not yet established care with a PCP close to home.  She was provided with the number of a PCP in her hometown through [facility].  Continue Nu-Iron 150 mg daily.  Recheck iron levels in the next 2 months

## 2023-08-10 ENCOUNTER — Telehealth (INDEPENDENT_AMBULATORY_CARE_PROVIDER_SITE_OTHER): Payer: Self-pay | Admitting: Family Medicine

## 2023-08-10 NOTE — Telephone Encounter (Signed)
12/12 Pt called in stating her pharmacy did not receive a prescription for Hydrochlorothiazide as she was told she would get. Please follow up with the pt.

## 2023-08-19 ENCOUNTER — Other Ambulatory Visit: Payer: Self-pay

## 2023-08-19 DIAGNOSIS — F411 Generalized anxiety disorder: Secondary | ICD-10-CM

## 2023-08-19 DIAGNOSIS — F331 Major depressive disorder, recurrent, moderate: Secondary | ICD-10-CM

## 2023-08-19 MED ORDER — LAMOTRIGINE 200 MG PO TABS: 200.0000 mg | ORAL_TABLET | Freq: Every day | ORAL | 1 refills | Status: DC

## 2023-08-25 ENCOUNTER — Telehealth: Payer: Self-pay

## 2023-08-25 DIAGNOSIS — G4700 Insomnia, unspecified: Secondary | ICD-10-CM

## 2023-09-01 DIAGNOSIS — N898 Other specified noninflammatory disorders of vagina: Secondary | ICD-10-CM | POA: Diagnosis not present

## 2023-09-01 DIAGNOSIS — Z01411 Encounter for gynecological examination (general) (routine) with abnormal findings: Secondary | ICD-10-CM | POA: Diagnosis not present

## 2023-09-01 DIAGNOSIS — Z1231 Encounter for screening mammogram for malignant neoplasm of breast: Secondary | ICD-10-CM | POA: Diagnosis not present

## 2023-09-04 ENCOUNTER — Telehealth: Payer: Medicare PPO | Admitting: Family Medicine

## 2023-09-04 NOTE — Telephone Encounter (Signed)
 Pt called today to check the status of her medication. She has been out since [place]

## 2023-09-05 ENCOUNTER — Telehealth: Payer: Self-pay

## 2023-09-05 ENCOUNTER — Telehealth (INDEPENDENT_AMBULATORY_CARE_PROVIDER_SITE_OTHER): Payer: Medicare PPO | Admitting: Family Medicine

## 2023-09-05 DIAGNOSIS — I10 Essential (primary) hypertension: Secondary | ICD-10-CM

## 2023-09-05 DIAGNOSIS — H1033 Unspecified acute conjunctivitis, bilateral: Secondary | ICD-10-CM | POA: Diagnosis not present

## 2023-09-05 DIAGNOSIS — G4700 Insomnia, unspecified: Secondary | ICD-10-CM

## 2023-09-05 DIAGNOSIS — D509 Iron deficiency anemia, unspecified: Secondary | ICD-10-CM | POA: Diagnosis not present

## 2023-09-05 DIAGNOSIS — E66811 Obesity, class 1: Secondary | ICD-10-CM | POA: Diagnosis not present

## 2023-09-05 DIAGNOSIS — Z6830 Body mass index (BMI) 30.0-30.9, adult: Secondary | ICD-10-CM | POA: Diagnosis not present

## 2023-09-05 DIAGNOSIS — E6609 Other obesity due to excess calories: Secondary | ICD-10-CM

## 2023-09-05 MED ORDER — ESZOPICLONE 3 MG PO TABS: ORAL_TABLET | ORAL | 3 refills | Status: DC

## 2023-09-05 MED ORDER — LOMAIRA 8 MG PO TABS: ORAL_TABLET | ORAL | 0 refills | Status: AC

## 2023-09-05 MED ORDER — POLYSACCHARIDE IRON COMPLEX 150 MG PO CAPS: 150.0000 mg | ORAL_CAPSULE | Freq: Every day | ORAL | 0 refills | Status: AC

## 2023-09-05 MED ORDER — HYDROCHLOROTHIAZIDE 12.5 MG PO CAPS: 12.5000 mg | ORAL_CAPSULE | Freq: Every day | ORAL | 0 refills | Status: DC

## 2023-09-05 NOTE — Telephone Encounter (Signed)
 Pt called today to check the status of her medication. She has been out since [place]

## 2023-09-05 NOTE — Progress Notes (Signed)
 Office: [phone]  /  Fax: [phone]  WEIGHT SUMMARY AND BIOMETRICS  No data recorded No data recorded  No data recorded  No data recorded No data recorded I connected with  Debra Buchanan on 09/05/23 by a video and audio enabled telemedicine application and verified that I am speaking with the correct person using two identifiers.  Patient Location: Home  Provider Location: Office/Clinic  I discussed the limitations of evaluation and management by telemedicine. The patient expressed understanding and agreed to proceed.   HPI  Chief Complaint: OBESITY  Debra Buchanan is here to discuss her progress with her obesity treatment plan. She is on the the Category 2 Plan and states she is following her eating plan approximately 80 % of the time. She states she is exercising 0 minutes 0 times per week.   Interval History:  Since last office visit she is up 2 lb She weighed 184 lb last week at [facility] office, this is a 2 lb gain from last month She has a net weight loss of 0 lb in the past 2 mos of medically supervised weight mnagement She was started on Lomaira  8 mg bid last visit She is doing no regular exercise She did indulge more over the holidays and traveled She is doing a better job with logging intake  Pharmacotherapy: Lomaira  8 mg bid  PHYSICAL EXAM:  There were no vitals taken for this visit. There is no height or weight on file to calculate BMI. Self reported weight: 184 lb Home BP cuff: 120/77  General: She is overweight, cooperative, alert, well developed, and in no acute distress. PSYCH: Has normal mood, affect and thought process.   Lungs: Normal breathing effort, no conversational dyspnea.   ASSESSMENT AND PLAN  TREATMENT PLAN FOR OBESITY:  Recommended Dietary Goals  Debra Buchanan is currently in the action stage of change. As such, her goal is to continue weight management plan. She has agreed to keeping a food journal and adhering to recommended goals of  1400 calories and 80+ g of  protein.  Behavioral Intervention  We discussed the following Behavioral Modification Strategies today: increasing lean protein intake to established goals, increasing vegetables, increasing fiber rich foods, increasing water intake , work on meal planning and preparation, work on counselling psychologist calories using tracking application, keeping healthy foods at home, continue to practice mindfulness when eating, planning for success, and continue to work on maintaining a reduced calorie state, getting the recommended amount of protein, incorporating whole foods, making healthy choices, staying well hydrated and practicing mindfulness when eating..  Additional resources provided today: NA  Recommended Physical Activity Goals  Debra Buchanan has been advised to work up to 150 minutes of moderate intensity aerobic activity a week and strengthening exercises 2-3 times per week for cardiovascular health, weight loss maintenance and preservation of muscle mass.   She has agreed to Start aerobic activity with a goal of 150 minutes a week at moderate intensity.   Pharmacotherapy changes for the treatment of obesity: none  ASSOCIATED CONDITIONS ADDRESSED TODAY  Primary hypertension -     hydroCHLOROthiazide ; Take 1 capsule (12.5 mg total) by mouth daily.  Dispense: 90 capsule; Refill: 0  Iron  deficiency anemia, unspecified iron  deficiency anemia type -     Polysaccharide Iron  Complex; Take 1 capsule (150 mg total) by mouth daily.  Dispense: 30 capsule; Refill: 0  Class 1 obesity due to excess calories with body mass index (BMI) of 30.0 to 30.9 in adult, unspecified whether serious  comorbidity present -     Lomaira ; 1 tab po 30 min before breakfast and 1 tab po 2 pm  Dispense: 60 tablet; Refill: 0      She was informed of the importance of frequent follow up visits to maximize her success with intensive lifestyle modifications for her multiple health  conditions.   ATTESTASTION STATEMENTS:  Reviewed by clinician on day of visit: allergies, medications, problem list, medical history, surgical history, family history, social history, and previous encounter notes pertinent to obesity diagnosis.   I have personally spent 30 minutes total time today in preparation, patient care, nutritional counseling and documentation for this visit, including the following: review of clinical lab tests; review of medical tests/procedures/services.      Debra Buchanan FORBES Haddock, DO DABFM, DABOM [facility] [address] [place], [place] [address] [phone]

## 2023-09-05 NOTE — Telephone Encounter (Signed)
 Next visit is 12/22/23. Debra Buchanan called requesting a refill on her Es zopiclone. Pharmacy is:   [facility] [place], [place] - [name]   Phone: [phone]  Fax: [phone]

## 2023-09-05 NOTE — Telephone Encounter (Signed)
Pended RF.  

## 2023-09-05 NOTE — Telephone Encounter (Signed)
 RF has already been pended to provider.

## 2023-09-25 DIAGNOSIS — L820 Inflamed seborrheic keratosis: Secondary | ICD-10-CM | POA: Diagnosis not present

## 2023-09-25 DIAGNOSIS — J340 Abscess, furuncle and carbuncle of nose: Secondary | ICD-10-CM | POA: Diagnosis not present

## 2023-09-25 DIAGNOSIS — L853 Xerosis cutis: Secondary | ICD-10-CM | POA: Diagnosis not present

## 2023-10-06 ENCOUNTER — Telehealth: Payer: Medicare PPO | Admitting: Adult Health

## 2023-10-09 ENCOUNTER — Telehealth: Payer: Medicare PPO | Admitting: Family Medicine

## 2023-10-09 ENCOUNTER — Telehealth: Payer: Medicare PPO | Admitting: Adult Health

## 2023-10-09 ENCOUNTER — Encounter: Payer: Self-pay | Admitting: Adult Health

## 2023-10-09 DIAGNOSIS — G2401 Drug induced subacute dyskinesia: Secondary | ICD-10-CM

## 2023-10-09 DIAGNOSIS — L820 Inflamed seborrheic keratosis: Secondary | ICD-10-CM | POA: Diagnosis not present

## 2023-10-09 DIAGNOSIS — G4700 Insomnia, unspecified: Secondary | ICD-10-CM | POA: Diagnosis not present

## 2023-10-09 DIAGNOSIS — J340 Abscess, furuncle and carbuncle of nose: Secondary | ICD-10-CM | POA: Diagnosis not present

## 2023-10-09 DIAGNOSIS — F331 Major depressive disorder, recurrent, moderate: Secondary | ICD-10-CM

## 2023-10-09 DIAGNOSIS — F411 Generalized anxiety disorder: Secondary | ICD-10-CM | POA: Diagnosis not present

## 2023-10-09 DIAGNOSIS — R208 Other disturbances of skin sensation: Secondary | ICD-10-CM | POA: Diagnosis not present

## 2023-10-09 DIAGNOSIS — F902 Attention-deficit hyperactivity disorder, combined type: Secondary | ICD-10-CM | POA: Diagnosis not present

## 2023-10-09 DIAGNOSIS — L538 Other specified erythematous conditions: Secondary | ICD-10-CM | POA: Diagnosis not present

## 2023-10-09 MED ORDER — DONEPEZIL HCL 10 MG PO TABS: 10.0000 mg | ORAL_TABLET | Freq: Every day | ORAL | 2 refills | Status: DC

## 2023-10-09 MED ORDER — QUETIAPINE FUMARATE 100 MG PO TABS: 100.0000 mg | ORAL_TABLET | Freq: Every day | ORAL | 0 refills | Status: DC

## 2023-10-09 MED ORDER — ESZOPICLONE 3 MG PO TABS: ORAL_TABLET | ORAL | 3 refills | Status: DC

## 2023-10-09 MED ORDER — AUSTEDO 12 MG PO TABS: 1.0000 | ORAL_TABLET | Freq: Two times a day (BID) | ORAL | 2 refills | Status: DC

## 2023-10-09 MED ORDER — AMPHETAMINE-DEXTROAMPHETAMINE 20 MG PO TABS: 20.0000 mg | ORAL_TABLET | Freq: Every day | ORAL | 0 refills | Status: DC

## 2023-10-09 MED ORDER — AMPHETAMINE-DEXTROAMPHETAMINE 20 MG PO TABS
20.0000 mg | ORAL_TABLET | Freq: Every day | ORAL | 0 refills | Status: DC
Start: 2023-12-04 — End: 2023-12-22

## 2023-10-09 NOTE — Progress Notes (Signed)
 Debra Buchanan [id number] 04/05/1956 68 y.o.  Virtual Visit via Video Note  I connected with pt @ on 10/09/23 at  9:30 AM EST by a video enabled telemedicine application and verified that I am speaking with the correct person using two identifiers.   I discussed the limitations of evaluation and management by telemedicine and the availability of in person appointments. The patient expressed understanding and agreed to proceed.  I discussed the assessment and treatment plan with the patient. The patient was provided an opportunity to ask questions and all were answered. The patient agreed with the plan and demonstrated an understanding of the instructions.   The patient was advised to call back or seek an in-person evaluation if the symptoms worsen or if the condition fails to improve as anticipated.  I provided 25 minutes of non-face-to-face time during this encounter.  The patient was located at home.  The provider was located at [facility].   Reagan Camera, NP   Subjective:   Patient ID:  Debra Buchanan is a 68 y.o. (DOB [birth year]) female.  Chief Complaint: No chief complaint on file.   HPI Debra Buchanan presents for follow-up of ADHD, anxiety, depression, and insomnia.  Describes mood today as "ok". Pleasant. Denies tearfulness. Mood symptoms - denies anxiety, depression, and irritability. Reports stable interest and motivation. Denies panic attacks. Reports some worry, rumination, and over thinking - more about her daughters. Mood is consistent. Stating "I feel like I'm doing alright". Feels like medications continue to work well. Taking medications as prescribed.  Energy levels improved. Active, has a regular exercise routine.  Enjoys some usual interests and activities. Single. Lives alone. Has 2 daughters. Talking with family and friends.  Appetite adequate. Weight loss. Sleeps better some nights than others - up and down to the bathroom. Averages 6 hours.  Reports napping on the weekends.  Reports focus and concentration difficulties - impacting home and work tasks. Would like to restart a low dose of Adderall. Completing tasks. Managing aspects of household. Retired. Working part-time at [employer]. 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 Denies SI or HI.  Denies AH or VH. Denies self harm. Denies substance use.   Review of Systems:  Review of Systems  Musculoskeletal:  Negative for gait problem.  Neurological:  Negative for tremors.  Psychiatric/Behavioral:         Please refer to HPI    Medications: I have reviewed the patient's current medications.  Current Outpatient Medications  Medication Sig Dispense Refill   amphetamine -dextroamphetamine  (ADDERALL) 20 MG tablet Take 1 tablet (20 mg total) by mouth daily. 30 tablet 0   [START ON 11/06/2023] amphetamine -dextroamphetamine  (ADDERALL) 20 MG tablet Take 1 tablet (20 mg total) by mouth daily. 30 tablet 0   [START ON 12/04/2023] amphetamine -dextroamphetamine  (ADDERALL) 20 MG tablet Take 1 tablet (20 mg total) by mouth daily. 30 tablet 0   aspirin EC 81 MG tablet Take 81 mg by mouth daily.     Cyanocobalamin  (VITAMIN B-12 PO) Take by mouth.     Deutetrabenazine  (AUSTEDO ) 12 MG TABS Take 1 tablet (12 mg total) by mouth 2 (two) times daily. 60 tablet 2   donepezil  (ARICEPT ) 10 MG tablet Take 1 tablet (10 mg total) by mouth daily. 30 tablet 2   ESTRADIOL PO Take by mouth.     Eszopiclone  3 MG TABS TAKE 1 TABLET BY MOUTH IMMEDIATELY BEFORE AT BEDTIME 30 tablet 3   hydrochlorothiazide  (MICROZIDE ) 12.5 MG capsule Take 1 capsule (12.5 mg  total) by mouth daily. 90 capsule 0   iron  polysaccharides (NU-IRON ) 150 MG capsule Take 1 capsule (150 mg total) by mouth daily. 30 capsule 0   Magnesium  Carbonate, Antacid, (MAGNESIUM  CARBONATE PO) Take by mouth.     Phentermine  HCl (LOMAIRA ) 8 MG TABS 1 tab po 30 min before breakfast and 1 tab po 2 pm 60 tablet 0   QUEtiapine  (SEROQUEL ) 100 MG tablet Take 1 tablet (100  mg total) by mouth at bedtime. 90 tablet 0   No current facility-administered medications for this visit.    Medication Side Effects: None  Allergies:  Allergies  Allergen Reactions   Grass Extracts  [Gramineae Pollens] Cough, Hives, Itching, Shortness Of Breath and Swelling   Iodine Swelling, Anaphylaxis and Rash   Shellfish Allergy Cough, Hives, Itching, Rash, Shortness Of Breath and Swelling   Shellfish-Derived Products Anaphylaxis   Other Hives, Itching and Cough    Pine Nuts   Penicillins     Unsure of reaction Other reaction(s): Other (See Comments) Unsure of reaction    Past Medical History:  Diagnosis Date   ADHD    Allergy    Anemia    Anxiety    Bipolar disorder (HCC)    Depression    Insomnia    Memory impairment    Mini stroke    Vitamin D  deficiency     Family History  Problem Relation Age of Onset   Breast cancer Mother    Emphysema Father    Heart attack Paternal Grandfather    Colon cancer Paternal Uncle    Esophageal cancer Neg Hx    Stomach cancer Neg Hx     Social History   Socioeconomic History   Marital status: Divorced    Spouse name: Not on file   Number of children: 2   Years of education: Not on file   Highest education level: Not on file  Occupational History   Occupation: Garment/textile technologist: [employer]  Tobacco Use   Smoking status: Never   Smokeless tobacco: Never  Vaping Use   Vaping status: Never Used  Substance and Sexual Activity   Alcohol use: Yes    Alcohol/week: 2.0 standard drinks of alcohol    Types: 2 Standard drinks or equivalent per week    Comment: occasional, 1-2 per week   Drug use: No   Sexual activity: Not on file  Other Topics Concern   Not on file  Social History Narrative   Lives at home alone   Right-handed   Rare caffeine use   Social Drivers of Corporate investment banker Strain: Low Risk  (03/04/2023)   Received from [facility]   Overall Financial Resource  Strain (CARDIA)    Difficulty of Paying Living Expenses: Not hard at all  Food Insecurity: No Food Insecurity (03/04/2023)   Received from [facility]   Hunger Vital Sign    Worried About Running Out of Food in the Last Year: Never true    Ran Out of Food in the Last Year: Never true  Transportation Needs: No Transportation Needs (03/04/2023)   Received from [facility] - Transportation    Lack of Transportation (Medical): No    Lack of Transportation (Non-Medical): No  Physical Activity: Insufficiently Active (03/04/2023)   Received from [facility]   Exercise Vital Sign    Days of Exercise per Week: 2 days    Minutes of Exercise per Session: 40  min  Stress: No Stress Concern Present (03/04/2023)   Received from [facility] - Occupational Stress Questionnaire    Feeling of Stress : Not at all  Social Connections: Socially Integrated (03/04/2023)   Received from [facility]   Social Network    How would you rate your social network (family, work, friends)?: Good participation with social networks  Intimate Partner Violence: Not At Risk (04/04/2023)   Received from [facility]   HITS    Over the last 12 months how often did your partner physically hurt you?: Never    Over the last 12 months how often did your partner insult you or talk down to you?: Never    Over the last 12 months how often did your partner threaten you with physical harm?: Never    Over the last 12 months how often did your partner scream or curse at you?: Never    Past Medical History, Surgical history, Social history, and Family history were reviewed and updated as appropriate.   Please see review of systems for further details on the patient's review from today.   Objective:   Physical Exam:  There were no vitals taken for this visit.  Physical Exam Constitutional:      General: She is not in acute distress. Musculoskeletal:        General: No  deformity.  Neurological:     Mental Status: She is alert and oriented to person, place, and time.     Coordination: Coordination normal.  Psychiatric:        Attention and Perception: Attention and perception normal. She does not perceive auditory or visual hallucinations.        Mood and Affect: Affect is not labile, blunt, angry or inappropriate.        Speech: Speech normal.        Behavior: Behavior normal.        Thought Content: Thought content normal. Thought content is not paranoid or delusional. Thought content does not include homicidal or suicidal ideation. Thought content does not include homicidal or suicidal plan.        Cognition and Memory: Cognition and memory normal.        Judgment: Judgment normal.     Comments: Insight intact     Lab Review:     Component Value Date/Time   NA 140 06/26/2023 1015   K 4.2 06/26/2023 1015   CL 102 06/26/2023 1015   CO2 22 06/26/2023 1015   GLUCOSE 101 (H) 06/26/2023 1015   GLUCOSE 89 01/14/2020 1440   BUN 12 06/26/2023 1015   CREATININE 0.97 06/26/2023 1015   CREATININE 0.89 01/14/2020 1440   CALCIUM 9.3 06/26/2023 1015   PROT 7.2 06/26/2023 1015   ALBUMIN 4.3 06/26/2023 1015   AST 21 06/26/2023 1015   ALT 20 06/26/2023 1015   ALKPHOS 116 06/26/2023 1015   BILITOT 0.5 06/26/2023 1015   GFRNONAA >60 09/29/2010 2228   GFRAA  09/29/2010 2228    >60        The eGFR has been calculated using the MDRD equation. This calculation has not been validated in all clinical situations. eGFR's persistently <60 mL/min signify possible Chronic Kidney Disease.       Component Value Date/Time   WBC 6.6 06/26/2023 1015   WBC 7.2 08/09/2020 1501   RBC 3.62 (L) 06/26/2023 1015   RBC 4.09 08/09/2020 1501   HGB 10.5 (L) 06/26/2023 1015  HCT 32.3 (L) 06/26/2023 1015   PLT 393 06/26/2023 1015   MCV 89 06/26/2023 1015   MCH 29.0 06/26/2023 1015   MCH 29.8 08/09/2020 1501   MCHC 32.5 06/26/2023 1015   MCHC 33.0 08/09/2020 1501    RDW 12.3 06/26/2023 1015   LYMPHSABS 1.8 06/26/2023 1015   MONOABS 0.7 09/29/2010 2228   EOSABS 0.2 06/26/2023 1015   BASOSABS 0.0 06/26/2023 1015    No results found for: "POCLITH", "LITHIUM"   No results found for: "PHENYTOIN", "PHENOBARB", "VALPROATE", "CBMZ"   .res Assessment: Plan:    Plan:  Add Adderall 20mg  daily - has taken previously   Lunesta  3mg  at hs   Seroquel  100mg  at hs Lamictal  200mg  at hs  Austedo  12mg  twice daily   Aricept  10mg  daily - will fill until seen by neurology  RTC 3 months   25 minutes spent dedicated to the care of this patient on the date of this encounter to include pre-visit review of records, ordering of medication, post visit documentation, and face-to-face time with the patient discussing ADHD, anxiety, depression, and insomnia.  Discussed continuing current medication regimen.  Patient advised to contact office with any questions, adverse effects, or acute worsening in signs and symptoms  Counseled patient regarding potential benefits, risks, and side effects of Lamictal  to include potential risk of Stevens-Johnson syndrome. Advised patient to stop taking Lamictal  and contact office immediately if rash develops and to seek urgent medical attention if rash is severe and/or spreading quickly.  Discussed potential metabolic side effects associated with atypical antipsychotics, as well as potential risk for movement side effects. Advised pt to contact office if movement side effects occur.   Diagnoses and all orders for this visit:  Major depressive disorder, recurrent episode, moderate (HCC) -     QUEtiapine  (SEROQUEL ) 100 MG tablet; Take 1 tablet (100 mg total) by mouth at bedtime.  Generalized anxiety disorder -     QUEtiapine  (SEROQUEL ) 100 MG tablet; Take 1 tablet (100 mg total) by mouth at bedtime.  Insomnia, unspecified type -     Eszopiclone  3 MG TABS; TAKE 1 TABLET BY MOUTH IMMEDIATELY BEFORE AT BEDTIME  Attention deficit  hyperactivity disorder (ADHD), combined type -     amphetamine -dextroamphetamine  (ADDERALL) 20 MG tablet; Take 1 tablet (20 mg total) by mouth daily. -     amphetamine -dextroamphetamine  (ADDERALL) 20 MG tablet; Take 1 tablet (20 mg total) by mouth daily. -     amphetamine -dextroamphetamine  (ADDERALL) 20 MG tablet; Take 1 tablet (20 mg total) by mouth daily. -     donepezil  (ARICEPT ) 10 MG tablet; Take 1 tablet (10 mg total) by mouth daily.  Tardive dyskinesia -     Deutetrabenazine  (AUSTEDO ) 12 MG TABS; Take 1 tablet (12 mg total) by mouth 2 (two) times daily.     Please see After Visit Summary for patient specific instructions.  Future Appointments  Date Time Provider Department Center  10/30/2023  9:40 AM Bowen, Leeta Puls, DO [facility] None  12/22/2023 10:00 AM Meta Kroenke Nattalie, NP [facility] None    No orders of the defined types were placed in this encounter.     -------------------------------

## 2023-10-24 ENCOUNTER — Other Ambulatory Visit: Payer: Self-pay | Admitting: Family Medicine

## 2023-10-24 DIAGNOSIS — I10 Essential (primary) hypertension: Secondary | ICD-10-CM

## 2023-10-24 MED ORDER — HYDROCHLOROTHIAZIDE 12.5 MG PO CAPS: 12.5000 mg | ORAL_CAPSULE | Freq: Every day | ORAL | 0 refills | Status: DC

## 2023-10-24 MED ORDER — HYDROCHLOROTHIAZIDE 12.5 MG PO CAPS: 12.5000 mg | ORAL_CAPSULE | Freq: Every day | ORAL | 0 refills | Status: AC

## 2023-10-30 ENCOUNTER — Ambulatory Visit: Payer: Medicare PPO | Admitting: Family Medicine

## 2023-10-30 DIAGNOSIS — J340 Abscess, furuncle and carbuncle of nose: Secondary | ICD-10-CM | POA: Diagnosis not present

## 2023-10-30 DIAGNOSIS — K130 Diseases of lips: Secondary | ICD-10-CM | POA: Diagnosis not present

## 2023-10-30 DIAGNOSIS — I781 Nevus, non-neoplastic: Secondary | ICD-10-CM | POA: Diagnosis not present

## 2023-10-30 DIAGNOSIS — L814 Other melanin hyperpigmentation: Secondary | ICD-10-CM | POA: Diagnosis not present

## 2023-11-10 DIAGNOSIS — R413 Other amnesia: Secondary | ICD-10-CM | POA: Diagnosis not present

## 2023-11-10 DIAGNOSIS — Z780 Asymptomatic menopausal state: Secondary | ICD-10-CM | POA: Diagnosis not present

## 2023-11-10 DIAGNOSIS — E785 Hyperlipidemia, unspecified: Secondary | ICD-10-CM | POA: Diagnosis not present

## 2023-11-10 DIAGNOSIS — G4700 Insomnia, unspecified: Secondary | ICD-10-CM | POA: Diagnosis not present

## 2023-11-10 DIAGNOSIS — F319 Bipolar disorder, unspecified: Secondary | ICD-10-CM | POA: Diagnosis not present

## 2023-11-10 DIAGNOSIS — Z1329 Encounter for screening for other suspected endocrine disorder: Secondary | ICD-10-CM | POA: Diagnosis not present

## 2023-11-10 DIAGNOSIS — I10 Essential (primary) hypertension: Secondary | ICD-10-CM | POA: Diagnosis not present

## 2023-11-10 DIAGNOSIS — G2401 Drug induced subacute dyskinesia: Secondary | ICD-10-CM | POA: Diagnosis not present

## 2023-11-10 DIAGNOSIS — Z7689 Persons encountering health services in other specified circumstances: Secondary | ICD-10-CM | POA: Diagnosis not present

## 2023-11-28 ENCOUNTER — Other Ambulatory Visit: Payer: Self-pay | Admitting: Adult Health

## 2023-11-28 DIAGNOSIS — G2401 Drug induced subacute dyskinesia: Secondary | ICD-10-CM

## 2023-12-04 DIAGNOSIS — E785 Hyperlipidemia, unspecified: Secondary | ICD-10-CM | POA: Diagnosis not present

## 2023-12-04 DIAGNOSIS — R7301 Impaired fasting glucose: Secondary | ICD-10-CM | POA: Diagnosis not present

## 2023-12-04 DIAGNOSIS — I10 Essential (primary) hypertension: Secondary | ICD-10-CM | POA: Diagnosis not present

## 2023-12-04 DIAGNOSIS — R7989 Other specified abnormal findings of blood chemistry: Secondary | ICD-10-CM | POA: Diagnosis not present

## 2023-12-11 DIAGNOSIS — R430 Anosmia: Secondary | ICD-10-CM | POA: Diagnosis not present

## 2023-12-11 DIAGNOSIS — I10 Essential (primary) hypertension: Secondary | ICD-10-CM | POA: Diagnosis not present

## 2023-12-11 DIAGNOSIS — H6122 Impacted cerumen, left ear: Secondary | ICD-10-CM | POA: Diagnosis not present

## 2023-12-11 DIAGNOSIS — R7303 Prediabetes: Secondary | ICD-10-CM | POA: Diagnosis not present

## 2023-12-11 DIAGNOSIS — N289 Disorder of kidney and ureter, unspecified: Secondary | ICD-10-CM | POA: Diagnosis not present

## 2023-12-11 DIAGNOSIS — R7989 Other specified abnormal findings of blood chemistry: Secondary | ICD-10-CM | POA: Diagnosis not present

## 2023-12-11 DIAGNOSIS — R438 Other disturbances of smell and taste: Secondary | ICD-10-CM | POA: Diagnosis not present

## 2023-12-11 DIAGNOSIS — E782 Mixed hyperlipidemia: Secondary | ICD-10-CM | POA: Diagnosis not present

## 2023-12-11 DIAGNOSIS — G2401 Drug induced subacute dyskinesia: Secondary | ICD-10-CM | POA: Diagnosis not present

## 2023-12-11 DIAGNOSIS — Z0000 Encounter for general adult medical examination without abnormal findings: Secondary | ICD-10-CM | POA: Diagnosis not present

## 2023-12-11 DIAGNOSIS — J329 Chronic sinusitis, unspecified: Secondary | ICD-10-CM | POA: Diagnosis not present

## 2023-12-11 DIAGNOSIS — R7301 Impaired fasting glucose: Secondary | ICD-10-CM | POA: Diagnosis not present

## 2023-12-11 DIAGNOSIS — N182 Chronic kidney disease, stage 2 (mild): Secondary | ICD-10-CM | POA: Diagnosis not present

## 2023-12-11 DIAGNOSIS — K5900 Constipation, unspecified: Secondary | ICD-10-CM | POA: Diagnosis not present

## 2023-12-11 DIAGNOSIS — Z13820 Encounter for screening for osteoporosis: Secondary | ICD-10-CM | POA: Diagnosis not present

## 2023-12-22 ENCOUNTER — Encounter: Payer: Self-pay | Admitting: Adult Health

## 2023-12-22 ENCOUNTER — Telehealth: Payer: Medicare PPO | Admitting: Adult Health

## 2023-12-22 DIAGNOSIS — G4700 Insomnia, unspecified: Secondary | ICD-10-CM | POA: Diagnosis not present

## 2023-12-22 DIAGNOSIS — F902 Attention-deficit hyperactivity disorder, combined type: Secondary | ICD-10-CM

## 2023-12-22 DIAGNOSIS — F411 Generalized anxiety disorder: Secondary | ICD-10-CM

## 2023-12-22 DIAGNOSIS — F331 Major depressive disorder, recurrent, moderate: Secondary | ICD-10-CM

## 2023-12-22 MED ORDER — AMPHETAMINE-DEXTROAMPHETAMINE 20 MG PO TABS: 20.0000 mg | ORAL_TABLET | Freq: Every day | ORAL | 0 refills | Status: DC

## 2023-12-22 MED ORDER — LAMOTRIGINE 200 MG PO TABS: 200.0000 mg | ORAL_TABLET | Freq: Every day | ORAL | 0 refills | Status: DC

## 2023-12-22 MED ORDER — QUETIAPINE FUMARATE 100 MG PO TABS: 100.0000 mg | ORAL_TABLET | Freq: Every day | ORAL | 0 refills | Status: DC

## 2023-12-22 MED ORDER — ESZOPICLONE 3 MG PO TABS: ORAL_TABLET | ORAL | 2 refills | Status: DC

## 2023-12-22 NOTE — Progress Notes (Signed)
 Debra Buchanan [id number] 1955-11-24 68 y.o.  Virtual Visit via Video Note  I connected with pt @ on 12/22/23 at 10:00 AM EDT by a video enabled telemedicine application and verified that I am speaking with the correct person using two identifiers.   I discussed the limitations of evaluation and management by telemedicine and the availability of in person appointments. The patient expressed understanding and agreed to proceed.  I discussed the assessment and treatment plan with the patient. The patient was provided an opportunity to ask questions and all were answered. The patient agreed with the plan and demonstrated an understanding of the instructions.   The patient was advised to call back or seek an in-person evaluation if the symptoms worsen or if the condition fails to improve as anticipated.  I provided 25 minutes of non-face-to-face time during this encounter.  The patient was located at home.  The provider was located at [facility].   Reagan Camera, NP   Subjective:   Patient ID:  Debra Buchanan is a 68 y.o. (DOB 1956-06-13) female.  Chief Complaint: No chief complaint on file.   HPI Debra Buchanan presents for follow-up of ADHD, anxiety, depression and insomnia.  Describes mood today as "ok". Pleasant. Denies tearfulness. Mood symptoms - denies anxiety, depression and irritability. Reports stable interest and motivation. Denies panic attacks. Reports some worry, rumination, and over thinking. Reports mood is stable. Stating "I feel like I'm doing ok". Feels like medications continue to work well. Taking medications as prescribed.  Energy levels improved. Active, does not have a regular exercise routine.  Enjoys some usual interests and activities. Single. Lives alone. Has 2 daughters. Talking with family and friends.  Appetite adequate. Weight stable. Sleeps better some nights than others - up and down to the bathroom. Averages 6 hours.  Reports focus  and concentration improved.  Completing tasks. Managing aspects of household. Retired. Working part-time at [employer]. 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 Denies SI or HI.  Denies AH or VH. Denies self harm. Denies substance use.  Review of Systems:  Review of Systems  Musculoskeletal:  Negative for gait problem.  Neurological:  Negative for tremors.  Psychiatric/Behavioral:         Please refer to HPI    Medications: I have reviewed the patient's current medications.  Current Outpatient Medications  Medication Sig Dispense Refill   amphetamine -dextroamphetamine  (ADDERALL) 20 MG tablet Take 1 tablet (20 mg total) by mouth daily. 30 tablet 0   [START ON 01/19/2024] amphetamine -dextroamphetamine  (ADDERALL) 20 MG tablet Take 1 tablet (20 mg total) by mouth daily. 30 tablet 0   [START ON 02/16/2024] amphetamine -dextroamphetamine  (ADDERALL) 20 MG tablet Take 1 tablet (20 mg total) by mouth daily. 30 tablet 0   aspirin EC 81 MG tablet Take 81 mg by mouth daily.     Cyanocobalamin  (VITAMIN B-12 PO) Take by mouth.     Deutetrabenazine  (AUSTEDO ) 12 MG TABS TAKE 1 TABLET BY MOUTH TWICE A DAY 60 tablet 0   donepezil  (ARICEPT ) 10 MG tablet Take 1 tablet (10 mg total) by mouth daily. 30 tablet 2   ESTRADIOL PO Take by mouth.     Eszopiclone  3 MG TABS TAKE 1 TABLET BY MOUTH IMMEDIATELY BEFORE AT BEDTIME 30 tablet 2   hydrochlorothiazide  (MICROZIDE ) 12.5 MG capsule Take 1 capsule (12.5 mg total) by mouth daily. 90 capsule 0   iron  polysaccharides (NU-IRON ) 150 MG capsule Take 1 capsule (150 mg total) by mouth daily. 30 capsule 0  lamoTRIgine  (LAMICTAL ) 200 MG tablet Take 1 tablet (200 mg total) by mouth daily. 90 tablet 0   Magnesium  Carbonate, Antacid, (MAGNESIUM  CARBONATE PO) Take by mouth.     Phentermine  HCl (LOMAIRA ) 8 MG TABS 1 tab po 30 min before breakfast and 1 tab po 2 pm 60 tablet 0   QUEtiapine  (SEROQUEL ) 100 MG tablet Take 1 tablet (100 mg total) by mouth at bedtime. 90 tablet 0   No  current facility-administered medications for this visit.   Medication Side Effects: None  Allergies:  Allergies  Allergen Reactions   Grass Extracts  [Gramineae Pollens] Cough, Hives, Itching, Shortness Of Breath and Swelling   Iodine Swelling, Anaphylaxis and Rash   Shellfish Allergy Cough, Hives, Itching, Rash, Shortness Of Breath and Swelling   Shellfish-Derived Products Anaphylaxis   Other Hives, Itching and Cough    Pine Nuts   Penicillins     Unsure of reaction Other reaction(s): Other (See Comments) Unsure of reaction    Past Medical History:  Diagnosis Date   ADHD    Allergy    Anemia    Anxiety    Bipolar disorder (HCC)    Depression    Insomnia    Memory impairment    Mini stroke    Vitamin D  deficiency     Family History  Problem Relation Age of Onset   Breast cancer Mother    Emphysema Father    Heart attack Paternal Grandfather    Colon cancer Paternal Uncle    Esophageal cancer Neg Hx    Stomach cancer Neg Hx     Social History   Socioeconomic History   Marital status: Divorced    Spouse name: Not on file   Number of children: 2   Years of education: Not on file   Highest education level: Not on file  Occupational History   Occupation: Garment/textile technologist: [employer]  Tobacco Use   Smoking status: Never   Smokeless tobacco: Never  Vaping Use   Vaping status: Never Used  Substance and Sexual Activity   Alcohol use: Yes    Alcohol/week: 2.0 standard drinks of alcohol    Types: 2 Standard drinks or equivalent per week    Comment: occasional, 1-2 per week   Drug use: No   Sexual activity: Not on file  Other Topics Concern   Not on file  Social History Narrative   Lives at home alone   Right-handed   Rare caffeine use   Social Drivers of Corporate investment banker Strain: Low Risk  (11/09/2023)   Received from [facility]    Overall Financial Resource Strain (CARDIA)    Difficulty  of Paying Living Expenses: Not hard at all  Food Insecurity: No Food Insecurity (11/09/2023)   Received from [facility]    Hunger Vital Sign    Worried About Running Out of Food in the Last Year: Never true    Ran Out of Food in the Last Year: Never true  Transportation Needs: No Transportation Needs (11/09/2023)   Received from [facility]    [employer] - Transportation    Lack of Transportation (Medical): No    Lack of Transportation (Non-Medical): No  Physical Activity: Insufficiently Active (11/09/2023)   Received from [facility]    Exercise Vital Sign    Days of Exercise per Week: 3 days    Minutes of Exercise per Session:  40 min  Stress: No Stress Concern Present (11/09/2023)   Received from [facility]    [employer] of Occupational Health - Occupational Stress Questionnaire    Feeling of Stress : Not at all  Social Connections: Moderately Isolated (11/09/2023)   Received from [facility]    Social Connection and Isolation Panel [NHANES]    Frequency of Communication with Friends and Family: More than three times a week    Frequency of Social Gatherings with Friends and Family: Once a week    Attends Religious Services: 1 to 4 times per year    Active Member of [employer] or Organizations: No    Attends Banker Meetings: Never    Marital Status: Divorced  Catering manager Violence: Not At Risk (04/04/2023)   Received from [facility]   HITS    Over the last 12 months how often did your partner physically hurt you?: Never    Over the last 12 months how often did your partner insult you or talk down to you?: Never    Over the last 12 months how often did your partner threaten you with physical harm?: Never    Over the last 12 months how often did your partner scream or curse at you?: Never    Past Medical History, Surgical history, Social history,  and Family history were reviewed and updated as appropriate.   Please see review of systems for further details on the patient's review from today.   Objective:   Physical Exam:  There were no vitals taken for this visit.  Physical Exam Constitutional:      General: She is not in acute distress. Musculoskeletal:        General: No deformity.  Neurological:     Mental Status: She is alert and oriented to person, place, and time.     Coordination: Coordination normal.  Psychiatric:        Attention and Perception: Attention and perception normal. She does not perceive auditory or visual hallucinations.        Mood and Affect: Mood normal. Affect is not labile, blunt, angry or inappropriate.        Speech: Speech normal.        Behavior: Behavior normal.        Thought Content: Thought content normal. Thought content is not paranoid or delusional. Thought content does not include homicidal or suicidal ideation. Thought content does not include homicidal or suicidal plan.        Cognition and Memory: Cognition and memory normal.        Judgment: Judgment normal.     Comments: Insight intact     Lab Review:     Component Value Date/Time   NA 140 06/26/2023 1015   K 4.2 06/26/2023 1015   CL 102 06/26/2023 1015   CO2 22 06/26/2023 1015   GLUCOSE 101 (H) 06/26/2023 1015   GLUCOSE 89 01/14/2020 1440   BUN 12 06/26/2023 1015   CREATININE 0.97 06/26/2023 1015   CREATININE 0.89 01/14/2020 1440   CALCIUM 9.3 06/26/2023 1015   PROT 7.2 06/26/2023 1015   ALBUMIN 4.3 06/26/2023 1015   AST 21 06/26/2023 1015   ALT 20 06/26/2023 1015   ALKPHOS 116 06/26/2023 1015   BILITOT 0.5 06/26/2023 1015   GFRNONAA >60 09/29/2010 2228   GFRAA  09/29/2010 2228    >60        The eGFR has been calculated using the MDRD equation.  This calculation has not been validated in all clinical situations. eGFR's persistently <60 mL/min signify possible Chronic Kidney Disease.       Component Value  Date/Time   WBC 6.6 06/26/2023 1015   WBC 7.2 08/09/2020 1501   RBC 3.62 (L) 06/26/2023 1015   RBC 4.09 08/09/2020 1501   HGB 10.5 (L) 06/26/2023 1015   HCT 32.3 (L) 06/26/2023 1015   PLT 393 06/26/2023 1015   MCV 89 06/26/2023 1015   MCH 29.0 06/26/2023 1015   MCH 29.8 08/09/2020 1501   MCHC 32.5 06/26/2023 1015   MCHC 33.0 08/09/2020 1501   RDW 12.3 06/26/2023 1015   LYMPHSABS 1.8 06/26/2023 1015   MONOABS 0.7 09/29/2010 2228   EOSABS 0.2 06/26/2023 1015   BASOSABS 0.0 06/26/2023 1015    No results found for: "POCLITH", "LITHIUM"   No results found for: "PHENYTOIN", "PHENOBARB", "VALPROATE", "CBMZ"   .res Assessment: Plan:    Plan:  Adderall 20mg  daily - has taken previously  Lunesta  3mg  at hs   Seroquel  100mg  at hs Lamictal  200mg  at hs   Austedo  12mg  twice daily - will fill until seen by neurology Aricept  10mg  daily will fill until seen by neurology   RTC 3 months  25 minutes spent dedicated to the care of this patient on the date of this encounter to include pre-visit review of records, ordering of medication, post visit documentation, and face-to-face time with the patient discussing ADHD, anxiety, depression, and insomnia.  Discussed continuing current medication regimen.  Patient advised to contact office with any questions, adverse effects, or acute worsening in signs and symptoms  Counseled patient regarding potential benefits, risks, and side effects of Lamictal  to include potential risk of Stevens-Johnson syndrome. Advised patient to stop taking Lamictal  and contact office immediately if rash develops and to seek urgent medical attention if rash is severe and/or spreading quickly.  Discussed potential metabolic side effects associated with atypical antipsychotics, as well as potential risk for movement side effects. Advised pt to contact office if movement side effects occur.   Diagnoses and all orders for this visit:  Major depressive disorder, recurrent  episode, moderate (HCC) -     QUEtiapine  (SEROQUEL ) 100 MG tablet; Take 1 tablet (100 mg total) by mouth at bedtime. -     lamoTRIgine  (LAMICTAL ) 200 MG tablet; Take 1 tablet (200 mg total) by mouth daily.  Generalized anxiety disorder -     QUEtiapine  (SEROQUEL ) 100 MG tablet; Take 1 tablet (100 mg total) by mouth at bedtime.  Insomnia, unspecified type -     Eszopiclone  3 MG TABS; TAKE 1 TABLET BY MOUTH IMMEDIATELY BEFORE AT BEDTIME  Attention deficit hyperactivity disorder (ADHD), combined type -     amphetamine -dextroamphetamine  (ADDERALL) 20 MG tablet; Take 1 tablet (20 mg total) by mouth daily. -     amphetamine -dextroamphetamine  (ADDERALL) 20 MG tablet; Take 1 tablet (20 mg total) by mouth daily. -     amphetamine -dextroamphetamine  (ADDERALL) 20 MG tablet; Take 1 tablet (20 mg total) by mouth daily.     Please see After Visit Summary for patient specific instructions.  No future appointments.  No orders of the defined types were placed in this encounter.     -------------------------------

## 2024-01-02 ENCOUNTER — Other Ambulatory Visit: Payer: Self-pay | Admitting: Adult Health

## 2024-01-02 DIAGNOSIS — G2401 Drug induced subacute dyskinesia: Secondary | ICD-10-CM

## 2024-01-02 NOTE — Telephone Encounter (Signed)
 Will review

## 2024-01-09 DIAGNOSIS — J329 Chronic sinusitis, unspecified: Secondary | ICD-10-CM | POA: Diagnosis not present

## 2024-01-11 ENCOUNTER — Other Ambulatory Visit: Payer: Self-pay

## 2024-01-11 DIAGNOSIS — F902 Attention-deficit hyperactivity disorder, combined type: Secondary | ICD-10-CM

## 2024-01-11 MED ORDER — DONEPEZIL HCL 10 MG PO TABS
10.0000 mg | ORAL_TABLET | Freq: Every day | ORAL | 2 refills | Status: DC
Start: 2024-01-11 — End: 2024-03-26

## 2024-01-12 ENCOUNTER — Telehealth: Payer: Self-pay | Admitting: Adult Health

## 2024-01-12 NOTE — Telephone Encounter (Signed)
 Next visit is 03/18/24. Requesting Aricept  10 mg called to:  [facility] - [place], [place] - [address]   Phone: [phone]  Fax: [phone]

## 2024-01-12 NOTE — Telephone Encounter (Signed)
 Rx was sent yesterday and I talked with patient yesterday.

## 2024-02-06 DIAGNOSIS — M25511 Pain in right shoulder: Secondary | ICD-10-CM | POA: Diagnosis not present

## 2024-02-06 DIAGNOSIS — M958 Other specified acquired deformities of musculoskeletal system: Secondary | ICD-10-CM | POA: Diagnosis not present

## 2024-02-14 DIAGNOSIS — M25511 Pain in right shoulder: Secondary | ICD-10-CM | POA: Diagnosis not present

## 2024-02-21 DIAGNOSIS — M25511 Pain in right shoulder: Secondary | ICD-10-CM | POA: Diagnosis not present

## 2024-02-22 DIAGNOSIS — M25511 Pain in right shoulder: Secondary | ICD-10-CM | POA: Diagnosis not present

## 2024-02-23 DIAGNOSIS — M25511 Pain in right shoulder: Secondary | ICD-10-CM | POA: Diagnosis not present

## 2024-02-26 DIAGNOSIS — M25511 Pain in right shoulder: Secondary | ICD-10-CM | POA: Diagnosis not present

## 2024-02-27 DIAGNOSIS — M25511 Pain in right shoulder: Secondary | ICD-10-CM | POA: Diagnosis not present

## 2024-02-28 DIAGNOSIS — M25511 Pain in right shoulder: Secondary | ICD-10-CM | POA: Diagnosis not present

## 2024-03-06 DIAGNOSIS — M25511 Pain in right shoulder: Secondary | ICD-10-CM | POA: Diagnosis not present

## 2024-03-07 DIAGNOSIS — M25511 Pain in right shoulder: Secondary | ICD-10-CM | POA: Diagnosis not present

## 2024-03-11 DIAGNOSIS — M25511 Pain in right shoulder: Secondary | ICD-10-CM | POA: Diagnosis not present

## 2024-03-12 DIAGNOSIS — M25511 Pain in right shoulder: Secondary | ICD-10-CM | POA: Diagnosis not present

## 2024-03-13 DIAGNOSIS — M25511 Pain in right shoulder: Secondary | ICD-10-CM | POA: Diagnosis not present

## 2024-03-14 DIAGNOSIS — H2513 Age-related nuclear cataract, bilateral: Secondary | ICD-10-CM | POA: Diagnosis not present

## 2024-03-14 DIAGNOSIS — H0288A Meibomian gland dysfunction right eye, upper and lower eyelids: Secondary | ICD-10-CM | POA: Diagnosis not present

## 2024-03-14 DIAGNOSIS — H0288B Meibomian gland dysfunction left eye, upper and lower eyelids: Secondary | ICD-10-CM | POA: Diagnosis not present

## 2024-03-14 DIAGNOSIS — H43811 Vitreous degeneration, right eye: Secondary | ICD-10-CM | POA: Diagnosis not present

## 2024-03-14 DIAGNOSIS — H2512 Age-related nuclear cataract, left eye: Secondary | ICD-10-CM | POA: Diagnosis not present

## 2024-03-18 ENCOUNTER — Telehealth: Admitting: Adult Health

## 2024-03-26 ENCOUNTER — Telehealth: Admitting: Adult Health

## 2024-03-26 ENCOUNTER — Encounter: Payer: Self-pay | Admitting: Adult Health

## 2024-03-26 ENCOUNTER — Telehealth: Payer: Self-pay | Admitting: Adult Health

## 2024-03-26 DIAGNOSIS — F902 Attention-deficit hyperactivity disorder, combined type: Secondary | ICD-10-CM

## 2024-03-26 DIAGNOSIS — G2401 Drug induced subacute dyskinesia: Secondary | ICD-10-CM

## 2024-03-26 DIAGNOSIS — F909 Attention-deficit hyperactivity disorder, unspecified type: Secondary | ICD-10-CM | POA: Diagnosis not present

## 2024-03-26 DIAGNOSIS — F419 Anxiety disorder, unspecified: Secondary | ICD-10-CM

## 2024-03-26 DIAGNOSIS — G4700 Insomnia, unspecified: Secondary | ICD-10-CM

## 2024-03-26 DIAGNOSIS — M25511 Pain in right shoulder: Secondary | ICD-10-CM | POA: Diagnosis not present

## 2024-03-26 DIAGNOSIS — F331 Major depressive disorder, recurrent, moderate: Secondary | ICD-10-CM

## 2024-03-26 DIAGNOSIS — F32A Depression, unspecified: Secondary | ICD-10-CM | POA: Diagnosis not present

## 2024-03-26 DIAGNOSIS — F411 Generalized anxiety disorder: Secondary | ICD-10-CM

## 2024-03-26 MED ORDER — ESZOPICLONE 3 MG PO TABS: ORAL_TABLET | ORAL | 2 refills | Status: DC

## 2024-03-26 MED ORDER — AMPHETAMINE-DEXTROAMPHETAMINE 20 MG PO TABS: 20.0000 mg | ORAL_TABLET | Freq: Every day | ORAL | 0 refills | Status: DC

## 2024-03-26 MED ORDER — LAMOTRIGINE 200 MG PO TABS: 200.0000 mg | ORAL_TABLET | Freq: Every day | ORAL | 0 refills | Status: DC

## 2024-03-26 MED ORDER — QUETIAPINE FUMARATE 100 MG PO TABS
100.0000 mg | ORAL_TABLET | Freq: Every day | ORAL | 0 refills | Status: DC
Start: 2024-03-26 — End: 2024-07-19

## 2024-03-26 MED ORDER — DONEPEZIL HCL 10 MG PO TABS: 10.0000 mg | ORAL_TABLET | Freq: Every day | ORAL | 2 refills | Status: DC

## 2024-03-26 NOTE — Telephone Encounter (Signed)
 Patient called in for refill on Austedo  12mg . PH: [phone] appt 10/27 [facility] [address] [place], [place]

## 2024-03-26 NOTE — Telephone Encounter (Signed)
 She is asking for RF of Austedo . Last filled with [place]. ? If ok to send there.

## 2024-03-26 NOTE — Progress Notes (Signed)
 Debra Buchanan [id number] 02/03/1956 68 y.o.  Virtual Visit via Video Note  I connected with pt @ on 03/26/24 at 10:00 AM EDT by a video enabled telemedicine application and verified that I am speaking with the correct person using two identifiers.   I discussed the limitations of evaluation and management by telemedicine and the availability of in person appointments. The patient expressed understanding and agreed to proceed.  I discussed the assessment and treatment plan with the patient. The patient was provided an opportunity to ask questions and all were answered. The patient agreed with the plan and demonstrated an understanding of the instructions.   The patient was advised to call back or seek an in-person evaluation if the symptoms worsen or if the condition fails to improve as anticipated.  I provided 25 minutes of non-face-to-face time during this encounter.  The patient was located at home.  The provider was located at [facility].   Angeline LOISE Sayers, NP   Subjective:   Patient ID:  Debra Buchanan is a 68 y.o. (DOB 1956-03-01) female.  Chief Complaint: No chief complaint on file.   HPI Debra Buchanan presents for follow-up of ADHD, anxiety, depression, TD and insomnia.  Describes mood today as ok. Pleasant. Denies tearfulness. Mood symptoms - denies depression and irritability. Reports anxiety. Reports stable interest and motivation. Denies panic attacks. Denies worry, rumination and over thinking. Reports mood is stable. Stating I feel like I'm doing good. Feels like medications continue to work well. Taking medications as prescribed.  Energy levels improved. Active, exercises some days. Enjoys some usual interests and activities. Single. Lives alone. Has 2 daughters. Talking with family and friends.  Appetite adequate. Weight stable. Sleeps better some nights than others. Averages 6 or more hours.  Reports focus and concentration stable. Completing  tasks. Managing aspects of household. Retired. Working part-time at [employer]. 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 Denies SI or HI.  Denies AH or VH. Denies self harm. Denies substance use.  Review of Systems:  Review of Systems  Musculoskeletal:  Negative for gait problem.  Neurological:  Negative for tremors.  Psychiatric/Behavioral:         Please refer to HPI    Medications: I have reviewed the patient's current medications.  Current Outpatient Medications  Medication Sig Dispense Refill   amphetamine -dextroamphetamine  (ADDERALL) 20 MG tablet Take 1 tablet (20 mg total) by mouth daily. 30 tablet 0   amphetamine -dextroamphetamine  (ADDERALL) 20 MG tablet Take 1 tablet (20 mg total) by mouth daily. 30 tablet 0   amphetamine -dextroamphetamine  (ADDERALL) 20 MG tablet Take 1 tablet (20 mg total) by mouth daily. 30 tablet 0   aspirin EC 81 MG tablet Take 81 mg by mouth daily.     Cyanocobalamin  (VITAMIN B-12 PO) Take by mouth.     Deutetrabenazine  (AUSTEDO ) 12 MG TABS TAKE 1 TABLET BY MOUTH TWICE A DAY 60 tablet 1   donepezil  (ARICEPT ) 10 MG tablet Take 1 tablet (10 mg total) by mouth daily. 30 tablet 2   ESTRADIOL PO Take by mouth.     Eszopiclone  3 MG TABS TAKE 1 TABLET BY MOUTH IMMEDIATELY BEFORE AT BEDTIME 30 tablet 2   hydrochlorothiazide  (MICROZIDE ) 12.5 MG capsule Take 1 capsule (12.5 mg total) by mouth daily. 90 capsule 0   iron  polysaccharides (NU-IRON ) 150 MG capsule Take 1 capsule (150 mg total) by mouth daily. 30 capsule 0   lamoTRIgine  (LAMICTAL ) 200 MG tablet Take 1 tablet (200 mg total) by mouth daily.  90 tablet 0   Magnesium  Carbonate, Antacid, (MAGNESIUM  CARBONATE PO) Take by mouth.     Phentermine  HCl (LOMAIRA ) 8 MG TABS 1 tab po 30 min before breakfast and 1 tab po 2 pm 60 tablet 0   QUEtiapine  (SEROQUEL ) 100 MG tablet Take 1 tablet (100 mg total) by mouth at bedtime. 90 tablet 0   No current facility-administered medications for this visit.    Medication Side Effects:  None  Allergies:  Allergies  Allergen Reactions   Grass Extracts  [Gramineae Pollens] Cough, Hives, Itching, Shortness Of Breath and Swelling   Iodine Swelling, Anaphylaxis and Rash   Shellfish Allergy Cough, Hives, Itching, Rash, Shortness Of Breath and Swelling   Shellfish-Derived Products Anaphylaxis   Other Hives, Itching and Cough    Pine Nuts   Penicillins     Unsure of reaction Other reaction(s): Other (See Comments) Unsure of reaction    Past Medical History:  Diagnosis Date   ADHD    Allergy    Anemia    Anxiety    Bipolar disorder (HCC)    Depression    Insomnia    Memory impairment    Mini stroke    Vitamin D  deficiency     Family History  Problem Relation Age of Onset   Breast cancer Mother    Emphysema Father    Heart attack Paternal Grandfather    Colon cancer Paternal Uncle    Esophageal cancer Neg Hx    Stomach cancer Neg Hx     Social History   Socioeconomic History   Marital status: Divorced    Spouse name: Not on file   Number of children: 2   Years of education: Not on file   Highest education level: Not on file  Occupational History   Occupation: Garment/textile technologist: [employer]  Tobacco Use   Smoking status: Never   Smokeless tobacco: Never  Vaping Use   Vaping status: Never Used  Substance and Sexual Activity   Alcohol use: Yes    Alcohol/week: 2.0 standard drinks of alcohol    Types: 2 Standard drinks or equivalent per week    Comment: occasional, 1-2 per week   Drug use: No   Sexual activity: Not on file  Other Topics Concern   Not on file  Social History Narrative   Lives at home alone   Right-handed   Rare caffeine use   Social Drivers of Corporate investment banker Strain: Low Risk  (11/09/2023)   Received from [facility]    Overall Financial Resource Strain (CARDIA)    Difficulty of Paying Living Expenses: Not hard at all  Food Insecurity: No Food Insecurity  (11/09/2023)   Received from [facility]    Hunger Vital Sign    Within the past 12 months, you worried that your food would run out before you got the money to buy more.: Never true    Within the past 12 months, the food you bought just didn't last and you didn't have money to get more.: Never true  Transportation Needs: No Transportation Needs (11/09/2023)   Received from [facility]    [employer] - Transportation    Lack of Transportation (Medical): No    Lack of Transportation (Non-Medical): No  Physical Activity: Insufficiently Active (11/09/2023)   Received from [facility]    Exercise Vital Sign    On average, how many days  per week do you engage in moderate to strenuous exercise (like a brisk walk)?: 3 days    On average, how many minutes do you engage in exercise at this level?: 40 min  Stress: No Stress Concern Present (11/09/2023)   Received from [facility]    [employer] of Occupational Health - Occupational Stress Questionnaire    Feeling of Stress : Not at all  Social Connections: Moderately Isolated (11/09/2023)   Received from [facility]    Social Connection and Isolation Panel    In a typical week, how many times do you talk on the phone with family, friends, or neighbors?: More than three times a week    How often do you get together with friends or relatives?: Once a week    How often do you attend church or religious services?: 1 to 4 times per year    Do you belong to any clubs or organizations such as church groups, unions, fraternal or athletic groups, or school groups?: No    How often do you attend meetings of the clubs or organizations you belong to?: Never    Are you married, widowed, divorced, separated, never married, or living with a partner?: Divorced  Intimate Partner Violence: Not At Risk (04/04/2023)   Received from [facility]   HITS     Over the last 12 months how often did your partner physically hurt you?: Never    Over the last 12 months how often did your partner insult you or talk down to you?: Never    Over the last 12 months how often did your partner threaten you with physical harm?: Never    Over the last 12 months how often did your partner scream or curse at you?: Never    Past Medical History, Surgical history, Social history, and Family history were reviewed and updated as appropriate.   Please see review of systems for further details on the patient's review from today.   Objective:   Physical Exam:  There were no vitals taken for this visit.  Physical Exam Constitutional:      General: She is not in acute distress. Musculoskeletal:        General: No deformity.  Neurological:     Mental Status: She is alert and oriented to person, place, and time.     Coordination: Coordination normal.  Psychiatric:        Attention and Perception: Attention and perception normal. She does not perceive auditory or visual hallucinations.        Mood and Affect: Mood normal. Mood is not anxious or depressed. Affect is not labile, blunt, angry or inappropriate.        Speech: Speech normal.        Behavior: Behavior normal.        Thought Content: Thought content normal. Thought content is not paranoid or delusional. Thought content does not include homicidal or suicidal ideation. Thought content does not include homicidal or suicidal plan.        Cognition and Memory: Cognition and memory normal.        Judgment: Judgment normal.     Comments: Insight intact     Lab Review:     Component Value Date/Time   NA 140 06/26/2023 1015   K 4.2 06/26/2023 1015   CL 102 06/26/2023 1015   CO2 22 06/26/2023 1015   GLUCOSE 101 (H) 06/26/2023 1015   GLUCOSE 89 01/14/2020 1440  BUN 12 06/26/2023 1015   CREATININE 0.97 06/26/2023 1015   CREATININE 0.89 01/14/2020 1440   CALCIUM 9.3 06/26/2023 1015   PROT 7.2  06/26/2023 1015   ALBUMIN 4.3 06/26/2023 1015   AST 21 06/26/2023 1015   ALT 20 06/26/2023 1015   ALKPHOS 116 06/26/2023 1015   BILITOT 0.5 06/26/2023 1015   GFRNONAA >60 09/29/2010 2228   GFRAA  09/29/2010 2228    >60        The eGFR has been calculated using the MDRD equation. This calculation has not been validated in all clinical situations. eGFR's persistently <60 mL/min signify possible Chronic Kidney Disease.       Component Value Date/Time   WBC 6.6 06/26/2023 1015   WBC 7.2 08/09/2020 1501   RBC 3.62 (L) 06/26/2023 1015   RBC 4.09 08/09/2020 1501   HGB 10.5 (L) 06/26/2023 1015   HCT 32.3 (L) 06/26/2023 1015   PLT 393 06/26/2023 1015   MCV 89 06/26/2023 1015   MCH 29.0 06/26/2023 1015   MCH 29.8 08/09/2020 1501   MCHC 32.5 06/26/2023 1015   MCHC 33.0 08/09/2020 1501   RDW 12.3 06/26/2023 1015   LYMPHSABS 1.8 06/26/2023 1015   MONOABS 0.7 09/29/2010 2228   EOSABS 0.2 06/26/2023 1015   BASOSABS 0.0 06/26/2023 1015    No results found for: POCLITH, LITHIUM   No results found for: PHENYTOIN, PHENOBARB, VALPROATE, CBMZ   .res Assessment: Plan:    Plan:  Adderall 20mg  daily - has taken previously  Lunesta  3mg  at hs   Seroquel  100mg  at hs Lamictal  200mg  at hs   Austedo  12mg  twice daily - will fill until seen by neurology Aricept  10mg  daily will fill until seen by neurology   RTC 3 months  25 minutes spent dedicated to the care of this patient on the date of this encounter to include pre-visit review of records, ordering of medication, post visit documentation, and face-to-face time with the patient discussing ADHD, anxiety, depression, and insomnia.  Discussed continuing current medication regimen.  Patient advised to contact office with any questions, adverse effects, or acute worsening in signs and symptoms  Counseled patient regarding potential benefits, risks, and side effects of Lamictal  to include potential risk of Stevens-Johnson  syndrome. Advised patient to stop taking Lamictal  and contact office immediately if rash develops and to seek urgent medical attention if rash is severe and/or spreading quickly.  Discussed potential metabolic side effects associated with atypical antipsychotics, as well as potential risk for movement side effects. Advised pt to contact office if movement side effects occur.   There are no diagnoses linked to this encounter.   Please see After Visit Summary for patient specific instructions.  Future Appointments  Date Time Provider Department Center  03/26/2024 10:00 AM Tori Dattilio Nattalie, NP [facility] None    No orders of the defined types were placed in this encounter.     -------------------------------

## 2024-03-28 DIAGNOSIS — H01131 Eczematous dermatitis of right upper eyelid: Secondary | ICD-10-CM | POA: Diagnosis not present

## 2024-03-28 DIAGNOSIS — L300 Nummular dermatitis: Secondary | ICD-10-CM | POA: Diagnosis not present

## 2024-03-28 DIAGNOSIS — H01134 Eczematous dermatitis of left upper eyelid: Secondary | ICD-10-CM | POA: Diagnosis not present

## 2024-04-03 DIAGNOSIS — H269 Unspecified cataract: Secondary | ICD-10-CM | POA: Diagnosis not present

## 2024-04-03 DIAGNOSIS — Z01818 Encounter for other preprocedural examination: Secondary | ICD-10-CM | POA: Diagnosis not present

## 2024-04-05 MED ORDER — AUSTEDO 12 MG PO TABS: 1.0000 | ORAL_TABLET | Freq: Two times a day (BID) | ORAL | 1 refills | Status: AC

## 2024-04-05 NOTE — Telephone Encounter (Signed)
 Ok to send to [facility], Angeline has noted will refill until pt seen by Neurology.

## 2024-04-05 NOTE — Telephone Encounter (Signed)
 Pended to [place]

## 2024-04-11 DIAGNOSIS — M25511 Pain in right shoulder: Secondary | ICD-10-CM | POA: Diagnosis not present

## 2024-04-11 DIAGNOSIS — M958 Other specified acquired deformities of musculoskeletal system: Secondary | ICD-10-CM | POA: Diagnosis not present

## 2024-04-23 DIAGNOSIS — H52202 Unspecified astigmatism, left eye: Secondary | ICD-10-CM | POA: Diagnosis not present

## 2024-04-23 DIAGNOSIS — H25812 Combined forms of age-related cataract, left eye: Secondary | ICD-10-CM | POA: Diagnosis not present

## 2024-04-23 DIAGNOSIS — H2512 Age-related nuclear cataract, left eye: Secondary | ICD-10-CM | POA: Diagnosis not present

## 2024-04-23 DIAGNOSIS — F419 Anxiety disorder, unspecified: Secondary | ICD-10-CM | POA: Diagnosis not present

## 2024-04-24 DIAGNOSIS — Z961 Presence of intraocular lens: Secondary | ICD-10-CM | POA: Diagnosis not present

## 2024-04-24 DIAGNOSIS — H2511 Age-related nuclear cataract, right eye: Secondary | ICD-10-CM | POA: Diagnosis not present

## 2024-05-02 DIAGNOSIS — J343 Hypertrophy of nasal turbinates: Secondary | ICD-10-CM | POA: Diagnosis not present

## 2024-05-02 DIAGNOSIS — G2401 Drug induced subacute dyskinesia: Secondary | ICD-10-CM | POA: Diagnosis not present

## 2024-05-02 DIAGNOSIS — J34829 Nasal valve collapse, unspecified: Secondary | ICD-10-CM | POA: Diagnosis not present

## 2024-05-02 DIAGNOSIS — J3489 Other specified disorders of nose and nasal sinuses: Secondary | ICD-10-CM | POA: Diagnosis not present

## 2024-05-02 DIAGNOSIS — F319 Bipolar disorder, unspecified: Secondary | ICD-10-CM | POA: Diagnosis not present

## 2024-05-02 DIAGNOSIS — E782 Mixed hyperlipidemia: Secondary | ICD-10-CM | POA: Diagnosis not present

## 2024-05-02 DIAGNOSIS — J309 Allergic rhinitis, unspecified: Secondary | ICD-10-CM | POA: Diagnosis not present

## 2024-05-02 DIAGNOSIS — R413 Other amnesia: Secondary | ICD-10-CM | POA: Diagnosis not present

## 2024-05-02 DIAGNOSIS — R438 Other disturbances of smell and taste: Secondary | ICD-10-CM | POA: Diagnosis not present

## 2024-05-02 DIAGNOSIS — I10 Essential (primary) hypertension: Secondary | ICD-10-CM | POA: Diagnosis not present

## 2024-05-03 ENCOUNTER — Telehealth: Payer: Self-pay | Admitting: Adult Health

## 2024-05-03 NOTE — Telephone Encounter (Signed)
 Next visit is 06/24/24. Debra Buchanan saw a neurologist yesterday, Dr. Ozell Dickinson in [place]. Phone number is [phone]. Dr. Dickinson told Debra Buchanan that her Lamictal  is probably causing the tardive kinesia. Dr. Dickinson said for her to drop 25 mg of the Lamictal  every two weeks.

## 2024-05-03 NOTE — Telephone Encounter (Signed)
 Please see message regarding Lamictal  and advise.

## 2024-05-03 NOTE — Telephone Encounter (Signed)
 Pt was advised of recommendation to monitor mood with Lamictal  decrease.

## 2024-05-14 DIAGNOSIS — Z8673 Personal history of transient ischemic attack (TIA), and cerebral infarction without residual deficits: Secondary | ICD-10-CM | POA: Diagnosis not present

## 2024-05-14 DIAGNOSIS — H25011 Cortical age-related cataract, right eye: Secondary | ICD-10-CM | POA: Diagnosis not present

## 2024-05-14 DIAGNOSIS — H2511 Age-related nuclear cataract, right eye: Secondary | ICD-10-CM | POA: Diagnosis not present

## 2024-05-14 DIAGNOSIS — F419 Anxiety disorder, unspecified: Secondary | ICD-10-CM | POA: Diagnosis not present

## 2024-05-14 DIAGNOSIS — H25811 Combined forms of age-related cataract, right eye: Secondary | ICD-10-CM | POA: Diagnosis not present

## 2024-05-14 DIAGNOSIS — Z7982 Long term (current) use of aspirin: Secondary | ICD-10-CM | POA: Diagnosis not present

## 2024-05-24 ENCOUNTER — Telehealth: Payer: Self-pay | Admitting: Adult Health

## 2024-05-24 DIAGNOSIS — N3001 Acute cystitis with hematuria: Secondary | ICD-10-CM | POA: Diagnosis not present

## 2024-05-24 DIAGNOSIS — S86892A Other injury of other muscle(s) and tendon(s) at lower leg level, left leg, initial encounter: Secondary | ICD-10-CM | POA: Diagnosis not present

## 2024-05-24 DIAGNOSIS — N898 Other specified noninflammatory disorders of vagina: Secondary | ICD-10-CM | POA: Diagnosis not present

## 2024-05-24 DIAGNOSIS — I10 Essential (primary) hypertension: Secondary | ICD-10-CM | POA: Diagnosis not present

## 2024-05-24 DIAGNOSIS — F319 Bipolar disorder, unspecified: Secondary | ICD-10-CM | POA: Diagnosis not present

## 2024-05-24 DIAGNOSIS — Z23 Encounter for immunization: Secondary | ICD-10-CM | POA: Diagnosis not present

## 2024-05-24 NOTE — Telephone Encounter (Signed)
 Debra Buchanan called in regarding a letter she needs for work. 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 She is feeling overwhelmed and now she has to take a class every Tuesday and is not eating or sleeping due to this. She did some research and saw that if RM could write a letter to defer the class so she can become acclimated to the job. As she cannot get in to see Tillman until November due to her [employer]. She is on the WL but says that if she could get the letter she may not need to see Tillman before scheduled appt. Ph: [phone] appt 11/21

## 2024-05-27 NOTE — Telephone Encounter (Signed)
 See message from patient. 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 She had to take a class within 3 years of being hired and is now at that 3 year mark. She said she is working FT hours, but is only PT, and doesn't feel she can take the class in addition to this.  She has talked to someone without response. I asked her if she had contacted HR and she said she wanted to have the letter in hand when she met with them. I'm not sure what exactly the class is.

## 2024-05-29 NOTE — Telephone Encounter (Signed)
 Pt said she had 3 years to complete the class, but didn't want to take it until she decided if she was going to stay or not. She reports being at the end of her contract. It is a career development course, 4 hours weekly. She said she is PT, but is working FT and this combined with the class is too much. She said she isn't looking to be exempt from the class, but wants it deferred another year to give her time to get used to the job. She said what she is doing now is different than what she did the last 2 years.

## 2024-06-17 DIAGNOSIS — Z01818 Encounter for other preprocedural examination: Secondary | ICD-10-CM | POA: Diagnosis not present

## 2024-06-18 DIAGNOSIS — J0140 Acute pansinusitis, unspecified: Secondary | ICD-10-CM | POA: Diagnosis not present

## 2024-06-18 DIAGNOSIS — Z6830 Body mass index (BMI) 30.0-30.9, adult: Secondary | ICD-10-CM | POA: Diagnosis not present

## 2024-06-18 DIAGNOSIS — R051 Acute cough: Secondary | ICD-10-CM | POA: Diagnosis not present

## 2024-06-20 DIAGNOSIS — D72829 Elevated white blood cell count, unspecified: Secondary | ICD-10-CM | POA: Diagnosis not present

## 2024-06-20 DIAGNOSIS — B9689 Other specified bacterial agents as the cause of diseases classified elsewhere: Secondary | ICD-10-CM | POA: Diagnosis not present

## 2024-06-20 DIAGNOSIS — J019 Acute sinusitis, unspecified: Secondary | ICD-10-CM | POA: Diagnosis not present

## 2024-06-20 DIAGNOSIS — Z01818 Encounter for other preprocedural examination: Secondary | ICD-10-CM | POA: Diagnosis not present

## 2024-06-24 ENCOUNTER — Telehealth: Admitting: Adult Health

## 2024-06-26 DIAGNOSIS — D72829 Elevated white blood cell count, unspecified: Secondary | ICD-10-CM | POA: Diagnosis not present

## 2024-06-27 DIAGNOSIS — J3489 Other specified disorders of nose and nasal sinuses: Secondary | ICD-10-CM | POA: Diagnosis not present

## 2024-06-27 DIAGNOSIS — J309 Allergic rhinitis, unspecified: Secondary | ICD-10-CM | POA: Diagnosis not present

## 2024-06-27 DIAGNOSIS — E782 Mixed hyperlipidemia: Secondary | ICD-10-CM | POA: Diagnosis not present

## 2024-06-27 DIAGNOSIS — R438 Other disturbances of smell and taste: Secondary | ICD-10-CM | POA: Diagnosis not present

## 2024-06-27 DIAGNOSIS — J343 Hypertrophy of nasal turbinates: Secondary | ICD-10-CM | POA: Diagnosis not present

## 2024-06-27 DIAGNOSIS — J34829 Nasal valve collapse, unspecified: Secondary | ICD-10-CM | POA: Diagnosis not present

## 2024-06-27 DIAGNOSIS — I10 Essential (primary) hypertension: Secondary | ICD-10-CM | POA: Diagnosis not present

## 2024-07-03 DIAGNOSIS — L820 Inflamed seborrheic keratosis: Secondary | ICD-10-CM | POA: Diagnosis not present

## 2024-07-03 DIAGNOSIS — L508 Other urticaria: Secondary | ICD-10-CM | POA: Diagnosis not present

## 2024-07-03 DIAGNOSIS — L2989 Other pruritus: Secondary | ICD-10-CM | POA: Diagnosis not present

## 2024-07-06 DIAGNOSIS — R059 Cough, unspecified: Secondary | ICD-10-CM | POA: Diagnosis not present

## 2024-07-06 DIAGNOSIS — R0989 Other specified symptoms and signs involving the circulatory and respiratory systems: Secondary | ICD-10-CM | POA: Diagnosis not present

## 2024-07-18 DIAGNOSIS — Z01818 Encounter for other preprocedural examination: Secondary | ICD-10-CM | POA: Diagnosis not present

## 2024-07-18 DIAGNOSIS — J329 Chronic sinusitis, unspecified: Secondary | ICD-10-CM | POA: Diagnosis not present

## 2024-07-18 DIAGNOSIS — B9689 Other specified bacterial agents as the cause of diseases classified elsewhere: Secondary | ICD-10-CM | POA: Diagnosis not present

## 2024-07-19 ENCOUNTER — Telehealth (INDEPENDENT_AMBULATORY_CARE_PROVIDER_SITE_OTHER): Admitting: Adult Health

## 2024-07-19 ENCOUNTER — Encounter: Payer: Self-pay | Admitting: Adult Health

## 2024-07-19 DIAGNOSIS — F909 Attention-deficit hyperactivity disorder, unspecified type: Secondary | ICD-10-CM

## 2024-07-19 DIAGNOSIS — F32A Depression, unspecified: Secondary | ICD-10-CM | POA: Diagnosis not present

## 2024-07-19 DIAGNOSIS — F419 Anxiety disorder, unspecified: Secondary | ICD-10-CM | POA: Diagnosis not present

## 2024-07-19 DIAGNOSIS — G4700 Insomnia, unspecified: Secondary | ICD-10-CM

## 2024-07-19 DIAGNOSIS — F902 Attention-deficit hyperactivity disorder, combined type: Secondary | ICD-10-CM

## 2024-07-19 DIAGNOSIS — F411 Generalized anxiety disorder: Secondary | ICD-10-CM

## 2024-07-19 DIAGNOSIS — F331 Major depressive disorder, recurrent, moderate: Secondary | ICD-10-CM

## 2024-07-19 MED ORDER — DONEPEZIL HCL 10 MG PO TABS: 10.0000 mg | ORAL_TABLET | Freq: Every day | ORAL | 2 refills | Status: AC

## 2024-07-19 MED ORDER — AMPHETAMINE-DEXTROAMPHETAMINE 20 MG PO TABS: 20.0000 mg | ORAL_TABLET | Freq: Every day | ORAL | 0 refills | Status: AC

## 2024-07-19 MED ORDER — ESZOPICLONE 3 MG PO TABS: ORAL_TABLET | ORAL | 2 refills | Status: AC

## 2024-07-19 MED ORDER — QUETIAPINE FUMARATE 100 MG PO TABS
100.0000 mg | ORAL_TABLET | Freq: Every day | ORAL | 1 refills | Status: AC
Start: 2024-07-19 — End: ?

## 2024-07-19 NOTE — Progress Notes (Signed)
 Debra Buchanan [id number] [birth year] 68 y.o.  Virtual Visit via Video Note  I connected with pt @ on 07/19/24 at  2:30 PM EST by a video enabled telemedicine application and verified that I am speaking with the correct person using two identifiers.   I discussed the limitations of evaluation and management by telemedicine and the availability of in person appointments. The patient expressed understanding and agreed to proceed.  I discussed the assessment and treatment plan with the patient. The patient was provided an opportunity to ask questions and all were answered. The patient agreed with the plan and demonstrated an understanding of the instructions.   The patient was advised to call back or seek an in-person evaluation if the symptoms worsen or if the condition fails to improve as anticipated.  I provided 25 minutes of non-face-to-face time during this encounter.  The patient was located at home.  The provider was located at [facility].   Debra Debra Sayers, NP   Subjective:   Patient ID:  Debra Buchanan is a 68 y.o. (DOB 1955/12/07) female.  Chief Complaint: No chief complaint on file.   HPI Debra Buchanan presents for follow-up of ADHD, anxiety, depression, TD and insomnia.  Describes mood today as ok. Pleasant. Denies tearfulness. Mood symptoms - denies depression and irritability. Reports anxiety. Reports stable interest and motivation. Denies panic attacks. Denies worry, rumination and over thinking. Reports mood is stable. Stating I feel like I'm doing good. Feels like medications continue to work well. Taking medications as prescribed.  Energy levels improved. Active, exercises some days. Enjoys some usual interests and activities. Single. Lives alone. Has 2 daughters. Talking with family and friends.  Appetite adequate. Weight stable. Sleeps better some nights than others. Averages 6 or more hours.  Reports focus and concentration stable. Completing  tasks. Managing aspects of household. 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 Denies SI or HI.  Denies AH or VH. Denies self harm. Denies substance use.   Review of Systems:  Review of Systems  Musculoskeletal:  Negative for gait problem.  Neurological:  Negative for tremors.  Psychiatric/Behavioral:         Please refer to HPI    Medications: I have reviewed the patient's current medications.  Current Outpatient Medications  Medication Sig Dispense Refill   amphetamine -dextroamphetamine  (ADDERALL) 20 MG tablet Take 1 tablet (20 mg total) by mouth daily. 30 tablet 0   amphetamine -dextroamphetamine  (ADDERALL) 20 MG tablet Take 1 tablet (20 mg total) by mouth daily. 30 tablet 0   amphetamine -dextroamphetamine  (ADDERALL) 20 MG tablet Take 1 tablet (20 mg total) by mouth daily. 30 tablet 0   aspirin EC 81 MG tablet Take 81 mg by mouth daily.     Cyanocobalamin  (VITAMIN B-12 PO) Take by mouth.     Deutetrabenazine  (AUSTEDO ) 12 MG TABS Take 1 tablet (12 mg total) by mouth 2 (two) times daily. 60 tablet 1   donepezil  (ARICEPT ) 10 MG tablet Take 1 tablet (10 mg total) by mouth daily. 30 tablet 2   ESTRADIOL PO Take by mouth.     Eszopiclone  3 MG TABS TAKE 1 TABLET BY MOUTH IMMEDIATELY BEFORE AT BEDTIME 30 tablet 2   hydrochlorothiazide  (MICROZIDE ) 12.5 MG capsule Take 1 capsule (12.5 mg total) by mouth daily. 90 capsule 0   iron  polysaccharides (NU-IRON ) 150 MG capsule Take 1 capsule (150 mg total) by mouth daily. 30 capsule 0   lamoTRIgine  (LAMICTAL ) 200 MG tablet Take 1 tablet (  200 mg total) by mouth daily. 90 tablet 0   Magnesium  Carbonate, Antacid, (MAGNESIUM  CARBONATE PO) Take by mouth.     Phentermine  HCl (LOMAIRA ) 8 MG TABS 1 tab po 30 min before breakfast and 1 tab po 2 pm 60 tablet 0   QUEtiapine  (SEROQUEL ) 100 MG tablet Take 1 tablet (100 mg total) by mouth at bedtime. 90 tablet 0   No current facility-administered medications for this visit.     Medication Side Effects: None  Allergies:  Allergies  Allergen Reactions   Grass Extracts  [Gramineae Pollens] Cough, Hives, Itching, Shortness Of Breath and Swelling   Iodine Swelling, Anaphylaxis and Rash   Shellfish Allergy Cough, Hives, Itching, Rash, Shortness Of Breath and Swelling   Shellfish Protein-Containing Drug Products Anaphylaxis   Other Hives, Itching and Cough    Pine Nuts   Penicillins     Unsure of reaction Other reaction(s): Other (See Comments) Unsure of reaction    Past Medical History:  Diagnosis Date   ADHD    Allergy    Anemia    Anxiety    Bipolar disorder (HCC)    Depression    Insomnia    Memory impairment    Mini stroke    Vitamin D  deficiency     Family History  Problem Relation Age of Onset   Breast cancer Mother    Emphysema Father    Heart attack Paternal Grandfather    Colon cancer Paternal Uncle    Esophageal cancer Neg Hx    Stomach cancer Neg Hx     Social History   Socioeconomic History   Marital status: Divorced    Spouse name: Not on file   Number of children: 2   Years of education: Not on file   Highest education level: Not on file  Occupational History   Occupation: Garment/textile Technologist: [employer]  Tobacco Use   Smoking status: Never   Smokeless tobacco: Never  Vaping Use   Vaping status: Never Used  Substance and Sexual Activity   Alcohol use: Yes    Alcohol/week: 2.0 standard drinks of alcohol    Types: 2 Standard drinks or equivalent per week    Comment: occasional, 1-2 per week   Drug use: No   Sexual activity: Not on file  Other Topics Concern   Not on file  Social History Narrative   Lives at home alone   Right-handed   Rare caffeine use   Social Drivers of Corporate Investment Banker Strain: Low Risk  (11/09/2023)   Received from [facility]    Overall Financial Resource Strain (CARDIA)    Difficulty of Paying Living Expenses: Not hard at  all  Food Insecurity: No Food Insecurity (11/09/2023)   Received from [facility]    Hunger Vital Sign    Within the past 12 months, you worried that your food would run out before you got the money to buy more.: Never true    Within the past 12 months, the food you bought just didn't last and you didn't have money to get more.: Never true  Transportation Needs: No Transportation Needs (11/09/2023)   Received from [facility]    [employer] - Transportation    Lack of Transportation (Medical): No    Lack of Transportation (Non-Medical): No  Physical Activity: Insufficiently Active (11/09/2023)   Received from [facility]    Exercise Vital Sign  On average, how many days per week do you engage in moderate to strenuous exercise (like a brisk walk)?: 3 days    On average, how many minutes do you engage in exercise at this level?: 40 min  Stress: No Stress Concern Present (11/09/2023)   Received from [facility]    [employer] of Occupational Health - Occupational Stress Questionnaire - Registry Metrics    Do you feel stress - tense, restless, nervous, or anxious, or unable to sleep at night because your mind is troubled all the time - these days?: Not at all  Social Connections: Moderately Isolated (11/09/2023)   Received from [facility]    Social Connection and Isolation Panel    In a typical week, how many times do you talk on the phone with family, friends, or neighbors?: More than three times a week    How often do you get together with friends or relatives?: Once a week    How often do you attend church or religious services?: 1 to 4 times per year    Do you belong to any clubs or organizations such as church groups, unions, fraternal or athletic groups, or school groups?: No    How often do you attend meetings of the clubs or organizations you belong to?: Never    Are  you married, widowed, divorced, separated, never married, or living with a partner?: Divorced  Intimate Partner Violence: Not At Risk (04/04/2023)   Received from [facility]   HITS    Over the last 12 months how often did your partner physically hurt you?: Never    Over the last 12 months how often did your partner insult you or talk down to you?: Never    Over the last 12 months how often did your partner threaten you with physical harm?: Never    Over the last 12 months how often did your partner scream or curse at you?: Never    Past Medical History, Surgical history, Social history, and Family history were reviewed and updated as appropriate.   Please see review of systems for further details on the patient's review from today.   Objective:   Physical Exam:  There were no vitals taken for this visit.  Physical Exam Constitutional:      General: She is not in acute distress. Musculoskeletal:        General: No deformity.  Neurological:     Mental Status: She is alert and oriented to person, place, and time.     Coordination: Coordination normal.  Psychiatric:        Attention and Perception: Attention and perception normal. She does not perceive auditory or visual hallucinations.        Mood and Affect: Mood normal. Mood is not anxious or depressed. Affect is not labile, blunt, angry or inappropriate.        Speech: Speech normal.        Behavior: Behavior normal.        Thought Content: Thought content normal. Thought content is not paranoid or delusional. Thought content does not include homicidal or suicidal ideation. Thought content does not include homicidal or suicidal plan.        Cognition and Memory: Cognition and memory normal.        Judgment: Judgment normal.     Comments: Insight intact     Lab Review:     Component Value Date/Time   NA 140 06/26/2023 1015  K 4.2 06/26/2023 1015   CL 102 06/26/2023 1015   CO2 22 06/26/2023 1015   GLUCOSE 101 (H)  06/26/2023 1015   GLUCOSE 89 01/14/2020 1440   BUN 12 06/26/2023 1015   CREATININE 0.97 06/26/2023 1015   CREATININE 0.89 01/14/2020 1440   CALCIUM 9.3 06/26/2023 1015   PROT 7.2 06/26/2023 1015   ALBUMIN 4.3 06/26/2023 1015   AST 21 06/26/2023 1015   ALT 20 06/26/2023 1015   ALKPHOS 116 06/26/2023 1015   BILITOT 0.5 06/26/2023 1015   GFRNONAA >60 09/29/2010 2228   GFRAA  09/29/2010 2228    >60        The eGFR has been calculated using the MDRD equation. This calculation has not been validated in all clinical situations. eGFR's persistently <60 mL/min signify possible Chronic Kidney Disease.       Component Value Date/Time   WBC 6.6 06/26/2023 1015   WBC 7.2 08/09/2020 1501   RBC 3.62 (L) 06/26/2023 1015   RBC 4.09 08/09/2020 1501   HGB 10.5 (L) 06/26/2023 1015   HCT 32.3 (L) 06/26/2023 1015   PLT 393 06/26/2023 1015   MCV 89 06/26/2023 1015   MCH 29.0 06/26/2023 1015   MCH 29.8 08/09/2020 1501   MCHC 32.5 06/26/2023 1015   MCHC 33.0 08/09/2020 1501   RDW 12.3 06/26/2023 1015   LYMPHSABS 1.8 06/26/2023 1015   MONOABS 0.7 09/29/2010 2228   EOSABS 0.2 06/26/2023 1015   BASOSABS 0.0 06/26/2023 1015    No results found for: POCLITH, LITHIUM   No results found for: PHENYTOIN, PHENOBARB, VALPROATE, CBMZ   .res Assessment: Plan:    Plan:  Adderall 20mg  daily - has taken previously  Lunesta  3mg  at hs   Seroquel  100mg  at hs  Neurology  Lamictal  200mg  at hs - tapering off Aricept  10mg  daily   D/C [place]  12mg  twice daily   RTC 3 months  25 minutes spent dedicated to the care of this patient on the date of this encounter to include pre-visit review of records, ordering of medication, post visit documentation, and face-to-face time with the patient discussing ADHD, anxiety, depression, and insomnia.  Discussed continuing current medication regimen.  Patient advised to contact office with any questions, adverse effects, or acute worsening in signs  and symptoms  Counseled patient regarding potential benefits, risks, and side effects of Lamictal  to include potential risk of Stevens-Johnson syndrome. Advised patient to stop taking Lamictal  and contact office immediately if rash develops and to seek urgent medical attention if rash is severe and/or spreading quickly.  Discussed potential metabolic side effects associated with atypical antipsychotics, as well as potential risk for movement side effects. Advised pt to contact office if movement side effects occur.    There are no diagnoses linked to this encounter.   Please see After Visit Summary for patient specific instructions.  No future appointments.  No orders of the defined types were placed in this encounter.     -------------------------------

## 2024-07-23 DIAGNOSIS — Z01818 Encounter for other preprocedural examination: Secondary | ICD-10-CM | POA: Diagnosis not present

## 2024-07-23 DIAGNOSIS — G2401 Drug induced subacute dyskinesia: Secondary | ICD-10-CM | POA: Diagnosis not present

## 2024-07-23 DIAGNOSIS — F319 Bipolar disorder, unspecified: Secondary | ICD-10-CM | POA: Diagnosis not present

## 2024-07-23 DIAGNOSIS — J3489 Other specified disorders of nose and nasal sinuses: Secondary | ICD-10-CM | POA: Diagnosis not present
